# Patient Record
Sex: Male | Born: 1980 | Race: White | Hispanic: No | Marital: Married | Smoking: Former smoker
Health system: Southern US, Community
[De-identification: ages and names within clinical notes are randomized; demographics above are authoritative.]

## PROBLEM LIST (undated history)

## (undated) DIAGNOSIS — I208 Other forms of angina pectoris: Secondary | ICD-10-CM

## (undated) DIAGNOSIS — I517 Cardiomegaly: Secondary | ICD-10-CM

## (undated) DIAGNOSIS — B009 Herpesviral infection, unspecified: Secondary | ICD-10-CM

## (undated) DIAGNOSIS — A419 Sepsis, unspecified organism: Secondary | ICD-10-CM

## (undated) DIAGNOSIS — I2089 Other forms of angina pectoris: Secondary | ICD-10-CM

## (undated) DIAGNOSIS — G4730 Sleep apnea, unspecified: Secondary | ICD-10-CM

## (undated) DIAGNOSIS — F17200 Nicotine dependence, unspecified, uncomplicated: Secondary | ICD-10-CM

## (undated) DIAGNOSIS — F1910 Other psychoactive substance abuse, uncomplicated: Secondary | ICD-10-CM

## (undated) DIAGNOSIS — Z22322 Carrier or suspected carrier of Methicillin resistant Staphylococcus aureus: Secondary | ICD-10-CM

## (undated) HISTORY — DX: Sleep apnea, unspecified: G47.30

## (undated) HISTORY — PX: TOE AMPUTATION: SHX809

## (undated) HISTORY — DX: Nicotine dependence, unspecified, uncomplicated: F17.200

## (undated) HISTORY — DX: Sepsis, unspecified organism: A41.9

## (undated) HISTORY — DX: Cardiomegaly: I51.7

## (undated) HISTORY — DX: Herpesviral infection, unspecified: B00.9

## (undated) HISTORY — DX: Other psychoactive substance abuse, uncomplicated: F19.10

## (undated) HISTORY — DX: Other forms of angina pectoris: I20.8

## (undated) HISTORY — DX: Other forms of angina pectoris: I20.89

## (undated) HISTORY — DX: Carrier or suspected carrier of methicillin resistant Staphylococcus aureus: Z22.322

---

## 2021-05-27 DIAGNOSIS — R0600 Dyspnea, unspecified: Secondary | ICD-10-CM

## 2021-06-09 ENCOUNTER — Encounter: Payer: Self-pay | Admitting: Cardiology

## 2021-06-10 NOTE — Progress Notes (Signed)
Cardiology Office Note:    Date:  06/11/2021   ID:  George Shelton, DOB 06/26/1981, MRN [id number]  PCP:  Adela Glimpse, NP  Cardiologist:  Norman Herrlich, MD   Referring MD: Adela Glimpse, NP  ASSESSMENT:    1. History of endocarditis   2. Chest pain of uncertain etiology   3. Shortness of breath   4. Sinus tachycardia   5. 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  He certainly is at risk for recurrent endocarditis and I could explain much of his symptoms including chest pain with a pleuritic component shortness of breath diaphoresis unintended weight loss.  He has taken recent antibiotic orally we will culture his blood look for markers of infection White count elevated sedimentation rate CRP and repeat his echocardiogram in my office.  Any unresolved issues and transesophageal echocardiogram is appropriate. The EKG certainly fits into the picture with right atrial enlargement will need a good look at his right heart with a history of IV drug abuse associated endocarditis At this time I do not think he requires an ischemia evaluation  Next appointment 4 weeks   Medication Adjustments/Labs and Tests Ordered: Current medicines are reviewed at length with the patient today.  Concerns regarding medicines are outlined above.  Orders Placed This Encounter  Procedures   CT Angio Chest Pulmonary Embolism (PE) W or WO Contrast   EKG 12-Lead   ECHOCARDIOGRAM COMPLETE    No orders of the defined types were placed in this encounter.    Chief Complaint  Patient presents with   Abnormal ECG  She is concerned regarding chest pain shortness of breath rapid heart rate weakness and unintended weight loss  History of Present Illness:    George Shelton is a 40 y.o. male who is being seen  today for the evaluation of sinus tachycardia and abnormal EKG shortness of breath and chest pain at the request of Adela Glimpse, NP.  He tells me has had 4 episodes of MRSA bacteremia and endocarditis last admission to the hospital 9 years ago and has lived a sober life since that time Recently treated for skin infection with Bactrim. He does not feel well he is weak he has episodes of diaphoresis he has shortness of breath with activities easy in the past to do walking back and forth to the truck and lifting and carrying. He has had chest pain mostly sharp left sternal area at times worse with a deep breath he also has had pressure in his chest.  He has a history of asthma but is not wheezing severely he is not using a bronchodilator.  He was placed on antihypertensive therapy with a beta-blocker and ACE inhibitor felt very badly and I stopped it. He has had no fever or chills. He relates his significant weight loss of over 35 pounds in the last 6 months unintentional He feels very weak  He had an echocardiogram at [facility] 05/27/2021 for shortness of breath the left ventricle is normal in size LV systolic function was normal EF 55 to 60% normal diastolic function the right ventricle is normal in size and function and the pulmonary artery systolic pressure was normal both atria are normal in size and there is no significant valvular abnormality  Chest x-ray 05/05/2021 no active disease  EKG  same date showed sinus tachycardia 123 bpm right atrial enlargement nonspecific ST-T abnormality personally reviewed  He was seen in the emergency room at [facility] 05/05/2021 with complaint of chest pain with a history of polysubstance abuse he was afebrile heart rate 119 bpm blood pressure 178 /97 Hemoglobin 15.6 potassium 4.0 creatinine 1.1 is felt not to have acute coronary syndrome and discharged from the emergency room.  An event monitor applied at [facility] internal medicine for 14 days  report shows no episodes of atrial fibrillation or SVT no bradycardia or pauses no episodes of heart block and no findings of ventricular tachycardia supraventricular ectopy burden rare less than 1% ventricular ectopy burden rare less than 1% he had symptomatic events associated with sinus tachycardia  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[facility])    HSV-1 (herpes simplex virus 1) infection    HSV-2 (herpes simplex virus 2) infection    Left atrial enlargement    MRSA (methicillin resistant staph aureus) culture positive    Nicotine dependence    Polysubstance abuse (HCC)    Sepsis (HCC)     Past Surgical History:  Procedure Laterality Date   TOE AMPUTATION     Big Toe    Current Medications: Current Meds  Medication Sig   valACYclovir (VALTREX) 1000 MG tablet Take 1,000 mg by mouth 2 (two) times daily.     Allergies:   Codeine and Vancomycin   Social History   Socioeconomic History   Marital status: Married    Spouse name: Not on file   Number of children: Not on file   Years of education: Not on file   Highest education level: Not on file  Occupational History   Not on file  Tobacco Use   Smoking status: Every Day    Packs/day: 1.50    Types: Cigarettes   Smokeless tobacco: Current    Types: Chew  Substance and Sexual Activity   Alcohol use: Yes    Comment: occasionally   Drug use: Never   Sexual activity: Not on file  Other Topics Concern   Not on file  Social History Narrative   Not on file   Social Determinants of Health   Financial Resource Strain: Not on file  Food Insecurity: Not on file  Transportation Needs: Not on file  Physical Activity: Not on file  Stress: Not on file  Social Connections: Not on file     Family History: The patient's family history includes Atrial fibrillation in his maternal grandmother; Diabetes in his father; Heart attack in his father; Hypertension in his father, maternal grandfather, and maternal  grandmother; Melanoma in his father; Pancreatic cancer in his paternal grandmother; Sleep apnea in his father.  ROS:   ROS Please see the history of present illness.     All other systems reviewed and are negative.  EKGs/Labs/Other Studies Reviewed:    The following studies were reviewed today:   EKG:  EKG is  ordered today.  The ekg ordered today is personally reviewed and demonstrates sinus rhythm right atrial enlargement on EKG    Physical Exam:    VS:  BP (!) 140/94 (BP Location: Left Arm, Patient Position: Sitting, Cuff Size: Normal)   Pulse 100   Ht 6\' 1"  (1.854 m)   Wt 203 lb (92.1 kg)   SpO2 97%   BMI 26.78 kg/m     Wt Readings from Last 3 Encounters:  06/11/21 203 lb (92.1 kg)  05/12/19 206  lb (93.4 kg)     GEN: He does not look acutely ill he is anxious he has no skin lesions or nail lesions of endocarditis.  Well nourished, well developed in no acute distress HEENT: Normal NECK: No JVD; No carotid bruits LYMPHATICS: No lymphadenopathy CARDIAC: No murmur RRR, no murmurs, rubs, gallops RESPIRATORY:  Clear to auscultation without rales, wheezing or rhonchi  ABDOMEN: Soft, non-tender, non-distended MUSCULOSKELETAL:  No edema; No deformity  SKIN: Warm and dry NEUROLOGIC:  Alert and oriented x 3 PSYCHIATRIC:  Normal affect     Signed, Norman Herrlich, MD  06/11/2021 1:48 PM    [facility]

## 2021-06-11 ENCOUNTER — Other Ambulatory Visit: Payer: Self-pay

## 2021-06-11 ENCOUNTER — Ambulatory Visit (INDEPENDENT_AMBULATORY_CARE_PROVIDER_SITE_OTHER): Payer: Self-pay | Admitting: Cardiology

## 2021-06-11 ENCOUNTER — Encounter: Payer: Self-pay | Admitting: Cardiology

## 2021-06-11 VITALS — BP 140/94 | HR 100 | Ht 73.0 in | Wt 203.0 lb

## 2021-06-11 DIAGNOSIS — R000 Tachycardia, unspecified: Secondary | ICD-10-CM

## 2021-06-11 DIAGNOSIS — R9431 Abnormal electrocardiogram [ECG] [EKG]: Secondary | ICD-10-CM

## 2021-06-11 DIAGNOSIS — Z8679 Personal history of other diseases of the circulatory system: Secondary | ICD-10-CM

## 2021-06-11 DIAGNOSIS — R0602 Shortness of breath: Secondary | ICD-10-CM

## 2021-06-11 DIAGNOSIS — R079 Chest pain, unspecified: Secondary | ICD-10-CM

## 2021-06-11 NOTE — Patient Instructions (Addendum)
Medication Instructions:  Your physician recommends that you continue on your current medications as directed. Please refer to the Current Medication list given to you today.  *If you need a refill on your cardiac medications before your next appointment, please call your pharmacy*   Lab Work: Your physician has recommended you make the following change in your medication:  AT [facility].  CVC, SED RATE, CRP, BLOOD CULTURES If you have labs (blood work) drawn today and your tests are completely normal, you will receive your results only by: MyChart Message (if you have MyChart) OR A paper copy in the mail If you have any lab test that is abnormal or we need to change your treatment, we will call you to review the results.   Testing/Procedures: Your physician has requested that you have an echocardiogram. Echocardiography is a painless test that uses sound waves to create images of your heart. It provides your doctor with information about the size and shape of your heart and how well your heart's chambers and valves are working. This procedure takes approximately one hour. There are no restrictions for this procedure.  CT-scan of the chest will be performed at [facility]. Please proceed to the hospital now to get this and your blood work completed.    Follow-Up: At [facility], you and your health needs are our priority.  As part of our continuing mission to provide you with exceptional heart care, we have created designated Provider Care Teams.  These Care Teams include your primary Cardiologist (physician) and Advanced Practice Providers (APPs -  Physician Assistants and Nurse Practitioners) who all work together to provide you with the care you need, when you need it.  We recommend signing up for the patient portal called "MyChart".  Sign up information is provided on this After Visit Summary.  MyChart is used to connect with patients for Virtual Visits (Telemedicine).   Patients are able to view lab/test results, encounter notes, upcoming appointments, etc.  Non-urgent messages can be sent to your provider as well.   To learn more about what you can do with MyChart, go to [email or web address].    Your next appointment:   Already scheduled  The format for your next appointment:   In Person  Provider:   Norman Herrlich, MD    Other Instructions

## 2021-06-19 ENCOUNTER — Telehealth: Payer: Self-pay | Admitting: Cardiology

## 2021-06-19 ENCOUNTER — Other Ambulatory Visit: Payer: Self-pay

## 2021-06-19 DIAGNOSIS — R072 Precordial pain: Secondary | ICD-10-CM

## 2021-06-19 DIAGNOSIS — R0602 Shortness of breath: Secondary | ICD-10-CM

## 2021-06-19 DIAGNOSIS — R079 Chest pain, unspecified: Secondary | ICD-10-CM

## 2021-06-19 NOTE — Telephone Encounter (Signed)
Tried calling patient. No answer and no voicemail set up for me to leave a message. 

## 2021-06-19 NOTE — Telephone Encounter (Signed)
Spoke with patient regarding Dr. Hulen Shouts recommendation.  Patient verbalizes understanding and is agreeable to plan of care. Advised patient to call back with any issues or concerns.    The following instructions were reviewed with the patient and he verbalizes understanding.   [facility] [address] [place], [place] [address] Phone:  [phone]    Please arrive 15 minutes prior to your appointment time for registration and insurance purposes.  The test will take approximately 3 to 4 hours to complete; you may bring reading material.  If someone comes with you to your appointment, they will need to remain in the main lobby due to limited space in the testing area. **If you are pregnant or breastfeeding, please notify the nuclear lab prior to your appointment**  How to prepare for your Myocardial Perfusion Test: Do not eat or drink 3 hours prior to your test, except you may have water. Do not consume products containing caffeine (regular or decaffeinated) 12 hours prior to your test. (ex: coffee, chocolate, sodas, tea). Do bring a list of your current medications with you.  If not listed below, you may take your medications as normal. Do wear comfortable clothes (no dresses or overalls) and walking shoes, tennis shoes preferred (No heels or open toe shoes are allowed). Do NOT wear cologne, perfume, aftershave, or lotions (deodorant is allowed). If these instructions are not followed, your test will have to be rescheduled.  Please report to [address] for your test.  If you have questions or concerns about your appointment, you can call the [facility] at [phone].  If you cannot keep your appointment, please provide 24 hours notification to the Nuclear Lab, to avoid a possible $50 charge to your account.

## 2021-06-19 NOTE — Addendum Note (Signed)
Addended by: Delorse Limber I on: 06/19/2021 04:39 PM   Modules accepted: Orders

## 2021-06-19 NOTE — Addendum Note (Signed)
Addended by: Norman Herrlich on: 06/19/2021 04:46 PM   Modules accepted: Orders

## 2021-06-19 NOTE — Telephone Encounter (Signed)
   Pt said he had an ultra sound of his heart 2 weeks ago, he wanted to know why he needs to get another one again.

## 2021-06-23 DIAGNOSIS — I517 Cardiomegaly: Secondary | ICD-10-CM | POA: Insufficient documentation

## 2021-06-23 DIAGNOSIS — F1910 Other psychoactive substance abuse, uncomplicated: Secondary | ICD-10-CM | POA: Insufficient documentation

## 2021-06-23 DIAGNOSIS — B009 Herpesviral infection, unspecified: Secondary | ICD-10-CM | POA: Insufficient documentation

## 2021-06-23 DIAGNOSIS — Z22322 Carrier or suspected carrier of Methicillin resistant Staphylococcus aureus: Secondary | ICD-10-CM | POA: Insufficient documentation

## 2021-06-23 DIAGNOSIS — F17200 Nicotine dependence, unspecified, uncomplicated: Secondary | ICD-10-CM | POA: Insufficient documentation

## 2021-06-23 DIAGNOSIS — I208 Other forms of angina pectoris: Secondary | ICD-10-CM | POA: Insufficient documentation

## 2021-06-23 DIAGNOSIS — A419 Sepsis, unspecified organism: Secondary | ICD-10-CM | POA: Insufficient documentation

## 2021-06-23 HISTORY — DX: Herpesviral infection, unspecified: B00.9

## 2021-06-24 ENCOUNTER — Telehealth: Payer: Self-pay | Admitting: Cardiology

## 2021-06-24 NOTE — Telephone Encounter (Signed)
Spoke with the patient just now and reminded him that I did call him with the results of his CT and his lab work from [facility]. We went over the results together again and he verbalizes understanding.   He did not have the repeat echo done here in the office as Dr. Dulce Sellar advised that he have the stress test completed instead. The patient is aware that this is scheduled for 07/02/21 and states that he will be here to get it done.

## 2021-06-24 NOTE — Telephone Encounter (Signed)
   Pt calling to get result of his CT echo and blood work. He said it was done at [facility] hospital

## 2021-06-26 NOTE — Addendum Note (Signed)
Addended by: Norman Herrlich on: 06/26/2021 01:53 PM   Modules accepted: Orders

## 2021-06-30 ENCOUNTER — Telehealth (HOSPITAL_COMMUNITY): Payer: Self-pay | Admitting: *Deleted

## 2021-06-30 NOTE — Addendum Note (Signed)
Addended by: Norman Herrlich on: 06/30/2021 02:34 PM   Modules accepted: Orders

## 2021-06-30 NOTE — Telephone Encounter (Signed)
Patient given detailed instructions per Myocardial Perfusion Study Information Sheet for the test on 07/02/21 Patient notified to arrive 15 minutes early and that it is imperative to arrive on time for appointment to keep from having the test rescheduled.  If you need to cancel or reschedule your appointment, please call the office within 24 hours of your appointment. . Patient verbalized understanding. George Shelton

## 2021-07-01 NOTE — Progress Notes (Signed)
Cardiology Office Note:    Date:  07/02/2021   ID:  George Shelton, DOB Aug 17, 1980, MRN [id number]  PCP:  Adela Glimpse, NP  Cardiologist:  Norman Herrlich, MD    Referring MD: Adela Glimpse, NP    ASSESSMENT:    1. Chest pain of uncertain etiology   2. Sinus tachycardia   3. Elevated blood pressure reading    PLAN:    In order of problems listed above:  Fortunately his testing is reassuring no findings of recurrent endocarditis pulmonary embolism.  He is scheduled for myocardial perfusion study I think stress and anxiety plays a major role but also has a chest wall deformity and pectus excavatum that can contribute to his nonanginal chest pain. He brought his iPhone I reviewed greater than 20 strips and he is symptomatic sinus tachycardia heart rates 1 20-1 30 has elevated blood pressure and will place him on a selective beta-blocker that should improve his symptoms.   Next appointment: 3 months with his perfusion study is normal Follow-up with me in the future as needed   Medication Adjustments/Labs and Tests Ordered: Current medicines are reviewed at length with the patient today.  Concerns regarding medicines are outlined above.  No orders of the defined types were placed in this encounter.  Meds ordered this encounter  Medications   nebivolol (BYSTOLIC) 5 MG tablet    Sig: Take 1 tablet (5 mg total) by mouth daily.    Dispense:  90 tablet    Refill:  3    Follow-up for cardiac testing, he is scheduled for myocardial perfusion study 07/08/2021   History of Present Illness:    George Shelton is a 40 y.o. 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  At that visit he was referred for CTA pulmonary embolism protocol that was normal at [facility].  Compliance with diet, lifestyle and medications: Yes  Recent testing: He had an  echocardiogram at [facility] 05/27/2021 for shortness of breath the left ventricle is normal in size LV systolic function was normal EF 55 to 60% normal diastolic function the right ventricle is normal in size and function and the pulmonary artery systolic pressure was normal both atria are normal in size and there is no significant valvular abnormality   Chest x-ray 05/05/2021 no active disease   EKG same date showed sinus tachycardia 123 bpm right atrial enlargement nonspecific ST-T abnormality personally reviewed   He was seen in the emergency room at [facility] 05/05/2021 with complaint of chest pain with a history of polysubstance abuse he was afebrile heart rate 119 bpm blood pressure 178 /97 Hemoglobin 15.6 potassium 4.0 creatinine 1.1 is felt not to have acute coronary syndrome and discharged from the emergency room.   An event monitor applied at [facility] internal medicine for 14 days report shows no episodes of atrial fibrillation or SVT no bradycardia or pauses no episodes of heart block and no findings of ventricular tachycardia supraventricular ectopy burden rare less than 1% ventricular ectopy burden rare less than 1% he had symptomatic events associated with sinus tachycardia  His chest CT scan performed 06/16/2024 showed no findings of pulmonary embolism, cardiovascular structures are normal he had a small pulmonary nodule lungs were otherwise normal and no musculoskeletal abnormality. 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[facility])    HSV-1 (herpes simplex virus 1) infection  HSV-2 (herpes simplex virus 2) infection    Left atrial enlargement    MRSA (methicillin resistant staph aureus) culture positive    Nicotine dependence    Polysubstance abuse (HCC)    Sepsis (HCC)     Past Surgical History:  Procedure Laterality Date   TOE AMPUTATION     Big Toe    Current Medications: Current Meds  Medication Sig   nebivolol (BYSTOLIC) 5 MG tablet Take  1 tablet (5 mg total) by mouth daily.     Allergies:   Codeine and Vancomycin   Social History   Socioeconomic History   Marital status: Married    Spouse name: Not on file   Number of children: Not on file   Years of education: Not on file   Highest education level: Not on file  Occupational History   Not on file  Tobacco Use   Smoking status: Every Day    Packs/day: 1.50    Types: Cigarettes   Smokeless tobacco: Current    Types: Chew  Substance and Sexual Activity   Alcohol use: Yes    Comment: occasionally   Drug use: Never   Sexual activity: Not on file  Other Topics Concern   Not on file  Social History Narrative   Not on file   Social Determinants of Health   Financial Resource Strain: Not on file  Food Insecurity: Not on file  Transportation Needs: Not on file  Physical Activity: Not on file  Stress: Not on file  Social Connections: Not on file     Family History: The patient's family history includes Atrial fibrillation in his maternal grandmother; Diabetes in his father; Heart attack in his father; Hypertension in his father, maternal grandfather, and maternal grandmother; Melanoma in his father; Pancreatic cancer in his paternal grandmother; Sleep apnea in his father. ROS:   Please see the history of present illness.    All other systems reviewed and are negative.  EKGs/Labs/Other Studies Reviewed:    The following studies were reviewed today:   Physical Exam:    VS:  BP (!) 144/78   Pulse (!) 108   Ht 6\' 1"  (1.854 m)   Wt 207 lb 3.2 oz (94 kg)   SpO2 99%   BMI 27.34 kg/m     Wt Readings from Last 3 Encounters:  07/02/21 207 lb 3.2 oz (94 kg)  06/11/21 203 lb (92.1 kg)  05/12/19 206 lb (93.4 kg)     GEN: Anxious well nourished, well developed in no acute distress he has mild thoracic scoliosis and chest wall deformity mild pectus HEENT: Normal NECK: No JVD; No carotid bruits LYMPHATICS: No lymphadenopathy CARDIAC: RRR, no murmurs,  rubs, gallops RESPIRATORY:  Clear to auscultation without rales, wheezing or rhonchi  ABDOMEN: Soft, non-tender, non-distended MUSCULOSKELETAL:  No edema; No deformity  SKIN: Warm and dry NEUROLOGIC:  Alert and oriented x 3 PSYCHIATRIC:  Normal affect    Signed, 07/12/19, MD  07/02/2021 3:30 PM    [facility]

## 2021-07-02 ENCOUNTER — Other Ambulatory Visit: Payer: Self-pay

## 2021-07-02 ENCOUNTER — Encounter: Payer: Self-pay | Admitting: Cardiology

## 2021-07-02 ENCOUNTER — Ambulatory Visit (INDEPENDENT_AMBULATORY_CARE_PROVIDER_SITE_OTHER): Payer: Self-pay | Admitting: Cardiology

## 2021-07-02 VITALS — BP 144/78 | HR 108 | Ht 73.0 in | Wt 207.2 lb

## 2021-07-02 DIAGNOSIS — R079 Chest pain, unspecified: Secondary | ICD-10-CM

## 2021-07-02 DIAGNOSIS — R030 Elevated blood-pressure reading, without diagnosis of hypertension: Secondary | ICD-10-CM

## 2021-07-02 DIAGNOSIS — R000 Tachycardia, unspecified: Secondary | ICD-10-CM

## 2021-07-02 MED ORDER — NEBIVOLOL HCL 5 MG PO TABS: 5.0000 mg | ORAL_TABLET | Freq: Every day | ORAL | 3 refills | Status: DC

## 2021-07-02 NOTE — Patient Instructions (Signed)
Medication Instructions:  Your physician has recommended you make the following change in your medication:  START: Bstolic 5 mg take one tablet by mouth daily.  *If you need a refill on your cardiac medications before your next appointment, please call your pharmacy*   Lab Work: None If you have labs (blood work) drawn today and your tests are completely normal, you will receive your results only by: MyChart Message (if you have MyChart) OR A paper copy in the mail If you have any lab test that is abnormal or we need to change your treatment, we will call you to review the results.   Testing/Procedures: None   Follow-Up: At [facility], you and your health needs are our priority.  As part of our continuing mission to provide you with exceptional heart care, we have created designated Provider Care Teams.  These Care Teams include your primary Cardiologist (physician) and Advanced Practice Providers (APPs -  Physician Assistants and Nurse Practitioners) who all work together to provide you with the care you need, when you need it.  We recommend signing up for the patient portal called "MyChart".  Sign up information is provided on this After Visit Summary.  MyChart is used to connect with patients for Virtual Visits (Telemedicine).  Patients are able to view lab/test results, encounter notes, upcoming appointments, etc.  Non-urgent messages can be sent to your provider as well.   To learn more about what you can do with MyChart, go to [email or web address].    Your next appointment:   3 month(s)  The format for your next appointment:   In Person  Provider:   Norman Herrlich, MD    Other Instructions

## 2021-07-12 DIAGNOSIS — M009 Pyogenic arthritis, unspecified: Secondary | ICD-10-CM

## 2021-07-12 HISTORY — DX: Pyogenic arthritis, unspecified: M00.9

## 2021-07-14 DIAGNOSIS — M009 Pyogenic arthritis, unspecified: Secondary | ICD-10-CM | POA: Insufficient documentation

## 2021-07-14 HISTORY — DX: Pyogenic arthritis, unspecified: M00.9

## 2021-07-17 ENCOUNTER — Telehealth (HOSPITAL_COMMUNITY): Payer: Self-pay | Admitting: *Deleted

## 2021-07-17 NOTE — Telephone Encounter (Signed)
Patient given detailed instructions per Myocardial Perfusion Study Information Sheet for the test on 07/23/2021 at 8:00. Patient notified to arrive 15 minutes early and that it is imperative to arrive on time for appointment to keep from having the test rescheduled.  If you need to cancel or reschedule your appointment, please call the office within 24 hours of your appointment. . Patient verbalized understanding.George Shelton

## 2021-07-23 ENCOUNTER — Ambulatory Visit (INDEPENDENT_AMBULATORY_CARE_PROVIDER_SITE_OTHER): Payer: Self-pay

## 2021-07-23 ENCOUNTER — Other Ambulatory Visit: Payer: Self-pay

## 2021-07-23 DIAGNOSIS — R079 Chest pain, unspecified: Secondary | ICD-10-CM

## 2021-07-23 DIAGNOSIS — R0602 Shortness of breath: Secondary | ICD-10-CM

## 2021-07-23 DIAGNOSIS — R072 Precordial pain: Secondary | ICD-10-CM

## 2021-07-23 MED ORDER — TECHNETIUM TC 99M TETROFOSMIN IV KIT
10.9000 | PACK | Freq: Once | INTRAVENOUS | Status: AC | PRN
  Administered 2021-07-23: 10.9 via INTRAVENOUS

## 2021-07-23 MED ORDER — TECHNETIUM TC 99M TETROFOSMIN IV KIT
31.1000 | PACK | Freq: Once | INTRAVENOUS | Status: AC | PRN
  Administered 2021-07-23: 31.1 via INTRAVENOUS

## 2021-07-23 MED ORDER — REGADENOSON 0.4 MG/5ML IV SOLN
0.4000 mg | Freq: Once | INTRAVENOUS | Status: AC
  Administered 2021-07-23: 0.4 mg via INTRAVENOUS

## 2021-07-24 ENCOUNTER — Telehealth: Payer: Self-pay

## 2021-07-24 LAB — MYOCARDIAL PERFUSION IMAGING
LV dias vol: 141 mL (ref 62–150)
LV sys vol: 58 mL
Nuc Stress EF: 59 %
Peak HR: 110 {beats}/min
Rest HR: 95 {beats}/min
Rest Nuclear Isotope Dose: 10.9 mCi
SDS: 1
SRS: 5
SSS: 6
ST Depression (mm): 0 mm
Stress Nuclear Isotope Dose: 31.1 mCi
TID: 0.97

## 2021-07-24 NOTE — Telephone Encounter (Signed)
-----   Message from Baldo Daub, MD sent at 07/24/2021  4:01 PM EST ----- Great result normal this is reassuring.

## 2021-07-24 NOTE — Telephone Encounter (Signed)
Spoke with patient regarding results and recommendation.  Patient verbalizes understanding and is agreeable to plan of care. Advised patient to call back with any issues or concerns.  

## 2021-08-05 DIAGNOSIS — A4902 Methicillin resistant Staphylococcus aureus infection, unspecified site: Secondary | ICD-10-CM | POA: Diagnosis not present

## 2021-08-05 DIAGNOSIS — G629 Polyneuropathy, unspecified: Secondary | ICD-10-CM | POA: Diagnosis not present

## 2021-08-05 DIAGNOSIS — L02419 Cutaneous abscess of limb, unspecified: Secondary | ICD-10-CM | POA: Diagnosis not present

## 2021-08-06 DIAGNOSIS — J209 Acute bronchitis, unspecified: Secondary | ICD-10-CM | POA: Diagnosis not present

## 2021-08-06 DIAGNOSIS — Z20822 Contact with and (suspected) exposure to covid-19: Secondary | ICD-10-CM | POA: Diagnosis not present

## 2021-09-29 DIAGNOSIS — I10 Essential (primary) hypertension: Secondary | ICD-10-CM | POA: Diagnosis not present

## 2021-09-29 DIAGNOSIS — S61401D Unspecified open wound of right hand, subsequent encounter: Secondary | ICD-10-CM | POA: Diagnosis not present

## 2021-09-29 DIAGNOSIS — L539 Erythematous condition, unspecified: Secondary | ICD-10-CM | POA: Diagnosis not present

## 2021-09-29 DIAGNOSIS — R600 Localized edema: Secondary | ICD-10-CM | POA: Diagnosis not present

## 2021-10-06 ENCOUNTER — Encounter: Payer: Self-pay | Admitting: Cardiology

## 2021-10-06 ENCOUNTER — Ambulatory Visit (INDEPENDENT_AMBULATORY_CARE_PROVIDER_SITE_OTHER): Payer: BC Managed Care – PPO | Admitting: Cardiology

## 2021-10-06 ENCOUNTER — Other Ambulatory Visit: Payer: Self-pay

## 2021-10-06 VITALS — BP 138/90 | HR 103 | Ht 73.0 in | Wt 199.0 lb

## 2021-10-06 DIAGNOSIS — R030 Elevated blood-pressure reading, without diagnosis of hypertension: Secondary | ICD-10-CM | POA: Diagnosis not present

## 2021-10-06 DIAGNOSIS — R000 Tachycardia, unspecified: Secondary | ICD-10-CM | POA: Diagnosis not present

## 2021-10-06 DIAGNOSIS — R079 Chest pain, unspecified: Secondary | ICD-10-CM | POA: Diagnosis not present

## 2021-10-06 NOTE — Patient Instructions (Signed)
Medication Instructions:  ?Your physician recommends that you continue on your current medications as directed. Please refer to the Current Medication list given to you today. ? ?*If you need a refill on your cardiac medications before your next appointment, please call your pharmacy* ? ? ?Lab Work: ?NONE ?If you have labs (blood work) drawn today and your tests are completely normal, you will receive your results only by: ?MyChart Message (if you have MyChart) OR ?A paper copy in the mail ?If you have any lab test that is abnormal or we need to change your treatment, we will call you to review the results. ? ? ?Testing/Procedures: ?NONE ? ? ?Follow-Up: ?At [facility], you and your health needs are our priority.  As part of our continuing mission to provide you with exceptional heart care, we have created designated Provider Care Teams.  These Care Teams include your primary Cardiologist (physician) and Advanced Practice Providers (APPs -  Physician Assistants and Nurse Practitioners) who all work together to provide you with the care you need, when you need it. ? ?We recommend signing up for the patient portal called "MyChart".  Sign up information is provided on this After Visit Summary.  MyChart is used to connect with patients for Virtual Visits (Telemedicine).  Patients are able to view lab/test results, encounter notes, upcoming appointments, etc.  Non-urgent messages can be sent to your provider as well.   ?To learn more about what you can do with MyChart, go to [email or web address].   ? ?Your next appointment:  As needed for your cardiac needs ?  ? ?The format for your next appointment:   ?In Person ? ?Provider:   ?Brian Munley, MD  ? ? ?Other Instructions ?  ?

## 2021-10-06 NOTE — Progress Notes (Signed)
?Cardiology Office Note:   ? ?Date:  10/06/2021  ? ?ID:  George Shelton, DOB 04-07-1981, MRN [id number] ? ?PCP:  Adela Glimpse, NP  ?Cardiologist:  Norman Herrlich, MD   ? ?Referring MD: Adela Glimpse, NP  ? ? ?ASSESSMENT:   ? ?1. Chest pain of uncertain etiology   ?2. Sinus tachycardia   ?3. Elevated blood pressure reading   ? ?PLAN:   ? ?In order of problems listed above: ? ?His cardiac testing echocardiogram myocardial perfusion study monitor overall reassuring and normal I think his sinus tachycardia is related to underlying infection I continue his beta-blocker and it appears as if he has recurrent severe infection of the right hand right wrist and require further evaluation and treatment with and without drainage and antibiotics. ? ? ?Next appointment: As needed ? ? ?Medication Adjustments/Labs and Tests Ordered: ?Current medicines are reviewed at length with the patient today.  Concerns regarding medicines are outlined above.  ?No orders of the defined types were placed in this encounter. ? ?No orders of the defined types were placed in this encounter. ? ? ?Chief Complaint  ?Patient presents with  ? Follow-up  ?  He had a recent myocardial perfusion study which was normal  ? ? ?History of Present Illness:   ? ?George Shelton is a 41 y.o. male with a hx of previous endocarditis with recurrent MRSA bacteremia last seen 07/02/2021 after an extensive evaluation for shortness of breath and chest pain. ?Compliance with diet, lifestyle and medications: yes ? ?He had an echocardiogram at [facility] 05/27/2021 for shortness of breath the left ventricle is normal in size LV systolic function was normal EF 55 to 60% normal diastolic function the right ventricle is normal in size and function and the pulmonary artery systolic pressure was normal both atria are normal in size and there is no significant valvular abnormality ?  ?An event monitor applied at [facility] internal medicine for 14 days report shows no  episodes of atrial fibrillation or SVT no bradycardia or pauses no episodes of heart block and no findings of ventricular tachycardia supraventricular ectopy burden rare less than 1% ventricular ectopy burden rare less than 1% he had symptomatic events associated with sinus tachycardia ?  ?His chest CT scan  showed no findings of pulmonary embolism, cardiovascular structures are normal he had a small pulmonary nodule lungs were otherwise normal and no musculoskeletal abnormality. ?Chest x-ray 05/05/2021 no active disease ? ?Most recently myocardial perfusion study performed 07/23/2021 showed normal left ventricular function EF 59% and normal myocardial perfusion. ? ?He had a recent admission at [facility] health 07/12/2021 admitted for 3 days with infection of the right wrist and wrist joints treated with intravenous and oral antibiotic with Zyvox.  He had orthopedic debridement of the right hand 07/13/2021 he was seen by infectious diseases.  Hepatitis C serology was abnormal.  Other labs included normal renal function.  CPK initially was elevated subsequently normalized CBC showed an elevated white count 16,400. ? ?Prior to entering the room he received a call phone call that he has bone infection on the x-ray I told him I think he should go to his PCP office get a copy of the x-ray report asked what to do about it.  He has increasing signs of infection in the right hand right wrist. ? ?No further chest pain shortness of breath and rapid heart rate was improved with his beta-blocker ?Past Medical History:  ?Diagnosis Date  ? Angina at rest [facility])   ?  HSV-1 (herpes simplex virus 1) infection   ? HSV-2 (herpes simplex virus 2) infection   ? Left atrial enlargement   ? MRSA (methicillin resistant staph aureus) culture positive   ? Nicotine dependence   ? Polysubstance abuse (HCC)   ? Sepsis (HCC)   ? ? ?Past Surgical History:  ?Procedure Laterality Date  ? TOE AMPUTATION    ? Big Toe  ? ? ?Current Medications: ?Current  Meds  ?Medication Sig  ? albuterol (VENTOLIN HFA) 108 (90 Base) MCG/ACT inhaler as needed.  ? clindamycin (CLEOCIN) 300 MG capsule Take 300 mg by mouth 3 (three) times daily.  ? gabapentin (NEURONTIN) 300 MG capsule Take by mouth as needed.  ? ibuprofen (ADVIL) 200 MG tablet Take by mouth daily.  ? nebivolol (BYSTOLIC) 5 MG tablet Take 1 tablet (5 mg total) by mouth daily.  ? sulfamethoxazole-trimethoprim (BACTRIM DS) 800-160 MG tablet Take 2 tablets by mouth 2 (two) times daily.  ? valACYclovir (VALTREX) 1000 MG tablet Take 1,000 mg by mouth 2 (two) times daily.  ?  ? ?Allergies:   Codeine and Vancomycin  ? ?Social History  ? ?Socioeconomic History  ? Marital status: Married  ?  Spouse name: Not on file  ? Number of children: Not on file  ? Years of education: Not on file  ? Highest education level: Not on file  ?Occupational History  ? Not on file  ?Tobacco Use  ? Smoking status: Every Day  ?  Packs/day: 1.50  ?  Types: Cigarettes  ? Smokeless tobacco: Current  ?  Types: Chew  ?Substance and Sexual Activity  ? Alcohol use: Yes  ?  Comment: occasionally  ? Drug use: Never  ? Sexual activity: Not on file  ?Other Topics Concern  ? Not on file  ?Social History Narrative  ? Not on file  ? ?Social Determinants of Health  ? ?Financial Resource Strain: Not on file  ?Food Insecurity: Not on file  ?Transportation Needs: Not on file  ?Physical Activity: Not on file  ?Stress: Not on file  ?Social Connections: Not on file  ?  ? ?Family History: ?The patient's family history includes Atrial fibrillation in his maternal grandmother; Diabetes in his father; Heart attack in his father; Hypertension in his father, maternal grandfather, and maternal grandmother; Melanoma in his father; Pancreatic cancer in his paternal grandmother; Sleep apnea in his father. ?ROS:   ?Please see the history of present illness.    ?All other systems reviewed and are negative. ? ?EKGs/Labs/Other Studies Reviewed:   ? ?The following studies were  reviewed today: ? ? ? ?Physical Exam:   ? ?VS:  BP 138/90   Pulse (!) 103   Ht 6\' 1"  (1.854 m)   Wt 199 lb (90.3 kg)   SpO2 97%   BMI 26.25 kg/m?    ? ?Wt Readings from Last 3 Encounters:  ?10/06/21 199 lb (90.3 kg)  ?07/23/21 203 lb (92.1 kg)  ?07/02/21 207 lb 3.2 oz (94 kg)  ?  ? ?GEN:  Well nourished, well developed in no acute distress right hand is warm swollen and the wound is open. ?HEENT: Normal ?NECK: No JVD; No carotid bruits ?LYMPHATICS: No lymphadenopathy ?CARDIAC: RRR, no murmurs, rubs, gallops ?RESPIRATORY:  Clear to auscultation without rales, wheezing or rhonchi  ?ABDOMEN: Soft, non-tender, non-distended ?MUSCULOSKELETAL:  No edema; No deformity  ?SKIN: Warm and dry ?NEUROLOGIC:  Alert and oriented x 3 ?PSYCHIATRIC:  Normal affect  ? ? ?Signed, ?14/01/22, MD  ?10/06/2021  4:34 PM    ?[facility]  ?

## 2021-10-07 DIAGNOSIS — L02419 Cutaneous abscess of limb, unspecified: Secondary | ICD-10-CM | POA: Diagnosis not present

## 2021-10-07 DIAGNOSIS — M659 Synovitis and tenosynovitis, unspecified: Secondary | ICD-10-CM | POA: Diagnosis not present

## 2021-10-07 DIAGNOSIS — A4902 Methicillin resistant Staphylococcus aureus infection, unspecified site: Secondary | ICD-10-CM | POA: Diagnosis not present

## 2021-10-07 DIAGNOSIS — L03113 Cellulitis of right upper limb: Secondary | ICD-10-CM | POA: Diagnosis not present

## 2021-10-07 DIAGNOSIS — Z5181 Encounter for therapeutic drug level monitoring: Secondary | ICD-10-CM | POA: Diagnosis not present

## 2021-12-01 ENCOUNTER — Encounter: Payer: Self-pay | Admitting: Sports Medicine

## 2021-12-01 ENCOUNTER — Ambulatory Visit (INDEPENDENT_AMBULATORY_CARE_PROVIDER_SITE_OTHER): Payer: BC Managed Care – PPO

## 2021-12-01 ENCOUNTER — Ambulatory Visit: Payer: BC Managed Care – PPO | Admitting: Sports Medicine

## 2021-12-01 VITALS — Temp 96.2°F

## 2021-12-01 DIAGNOSIS — L03031 Cellulitis of right toe: Secondary | ICD-10-CM

## 2021-12-01 DIAGNOSIS — Z8614 Personal history of Methicillin resistant Staphylococcus aureus infection: Secondary | ICD-10-CM

## 2021-12-01 DIAGNOSIS — S90821A Blister (nonthermal), right foot, initial encounter: Secondary | ICD-10-CM | POA: Diagnosis not present

## 2021-12-01 DIAGNOSIS — L97519 Non-pressure chronic ulcer of other part of right foot with unspecified severity: Secondary | ICD-10-CM

## 2021-12-01 DIAGNOSIS — G629 Polyneuropathy, unspecified: Secondary | ICD-10-CM

## 2021-12-01 DIAGNOSIS — L02611 Cutaneous abscess of right foot: Secondary | ICD-10-CM

## 2021-12-01 DIAGNOSIS — F1910 Other psychoactive substance abuse, uncomplicated: Secondary | ICD-10-CM

## 2021-12-01 MED ORDER — GABAPENTIN 600 MG PO TABS: 600.0000 mg | ORAL_TABLET | Freq: Three times a day (TID) | ORAL | 2 refills | Status: DC

## 2021-12-01 MED ORDER — SULFAMETHOXAZOLE-TRIMETHOPRIM 400-80 MG PO TABS: 1.0000 | ORAL_TABLET | Freq: Two times a day (BID) | ORAL | 0 refills | Status: DC

## 2021-12-01 NOTE — Progress Notes (Signed)
Subjective: ?George Shelton is a 41 y.o. male patient seen in office for evaluation of ulceration of the right second toe states that it always rubs and stays very callused and states that he also had a blister over his previous hallux amputation stump and states that he has had issues with this foot for over 3 years is started out with his right great toe losing that back in 2018 due to osteomyelitis and they state that he was also bitten a few years ago by a dog on the foot and has always had problems with infection has a history of MRSA and states that now the right second toe has been red and swollen for the last week.  Patient admits to a history of substance abuse and states that he is in recovery and does not use pain medication only takes ibuprofen and states that he needs a refill on his Neurontin. ? ?Patient Active Problem List  ? Diagnosis Date Noted  ? Wrist joint infection (HCC) 07/14/2021  ? Infection of right wrist (HCC) 07/12/2021  ? Angina at rest [facility]) 06/23/2021  ? HSV-1 (herpes simplex virus 1) infection 06/23/2021  ? Left atrial enlargement 06/23/2021  ? MRSA (methicillin resistant staph aureus) culture positive 06/23/2021  ? Nicotine dependence 06/23/2021  ? Polysubstance abuse (HCC) 06/23/2021  ? Sepsis (HCC) 06/23/2021  ? ?Current Outpatient Medications on File Prior to Visit  ?Medication Sig Dispense Refill  ? ibuprofen (ADVIL) 200 MG tablet Take by mouth daily.    ? nebivolol (BYSTOLIC) 5 MG tablet Take 1 tablet (5 mg total) by mouth daily. 90 tablet 3  ? ?No current facility-administered medications on file prior to visit.  ? ?Allergies  ?Allergen Reactions  ? Codeine   ? Vancomycin   ? ? ?No results found for this or any previous visit (from the past 2160 hour(s)). ? ?Objective: ?There were no vitals filed for this visit. ? ?General: Patient is awake, alert, oriented x 3 and in no acute distress. ? ?Dermatology: Skin is warm and dry bilateral with a pinpoint ulceration present  ?Distal  tuft of the right second toe. Ulceration measures 0.1 cm x 0.1 cm x 0.1 cm. There is a minimal keratotic border with a granular base. The ulceration does not probe to bone. There is no malodor, no active drainage, localized erythema, localized edema.  ? ?To the hallux amputation stump there is a ruptured blister that has not deroofed with no serous or active drainage, no significant redness or warmth or swelling to this affected area. ? ?Vascular: Dorsalis Pedis pulse = 1/4 Bilateral,  Posterior Tibial pulse = 1/4 Bilateral,  Capillary Fill Time < 5 seconds x9 ? ?Neurologic: Protective sensation severely diminished bilateral worse on the right. ? ?Musculosketal: Status post right hallux amputation 2010 ? ?Xrays, right foot: To the right second toe no bony destruction suggestive of osteomyelitis. No gas in soft tissues.  The second toe is noted to be rather long compared to the other toes.  History of previous hallux and distal metatarsal amputation. ? ?No results for input(s): GRAMSTAIN, LABORGA in the last 8760 hours. ? ?Assessment and Plan:  ?Problem List Items Addressed This Visit   ? ?  ? Other  ? Polysubstance abuse (HCC)  ? ?Other Visit Diagnoses   ? ? Ulcer of toe of right foot, unspecified ulcer stage (HCC)    -  Primary  ? Relevant Orders  ? DG Foot Complete Right  ? Cellulitis and abscess of toe of right  foot      ? Blister of right foot, initial encounter      ? Neuropathy      ? History of MRSA infection      ? Relevant Medications  ? sulfamethoxazole-trimethoprim (BACTRIM) 400-80 MG tablet  ? ?  ? ? ? ?-Examined patient and discussed the progression of the wound and treatment alternatives. ?-Xrays reviewed ?-Cleanse ulceration and apply Betadine and advised patient to do the same once daily may leave open to air or cover with a loose white clean sock ?-Prescribed Bactrim for patient to take for preventative measures for cellulitis of toe and history of MRSA ?-Prescribed Neurontin 600 mg 3 times a day as  needed for pain unrelieved by ibuprofen.  This medication regimen is recommended since patient is in recovery ?-Discussed with patient because he is very active with working in the mechanical pressure that is being put onto the second toe it will likely continue to rub and reulceration always be a problem like it has been for years may benefit in the future from a transmetatarsal amputation with a custom toe filler in the near future ?- Advised patient to go to the ER or return to office if the wound worsens or if constitutional symptoms are present. ?-Patient to return to office in 2 weeks for follow up care and possible future planning of a right TMA or sooner if problems arise. ? ?Asencion Islam, DPM ?

## 2021-12-14 ENCOUNTER — Ambulatory Visit (INDEPENDENT_AMBULATORY_CARE_PROVIDER_SITE_OTHER): Payer: BC Managed Care – PPO

## 2021-12-14 ENCOUNTER — Encounter: Payer: Self-pay | Admitting: Podiatry

## 2021-12-14 ENCOUNTER — Ambulatory Visit: Payer: BC Managed Care – PPO | Admitting: Podiatry

## 2021-12-14 DIAGNOSIS — L97519 Non-pressure chronic ulcer of other part of right foot with unspecified severity: Secondary | ICD-10-CM

## 2021-12-14 DIAGNOSIS — M869 Osteomyelitis, unspecified: Secondary | ICD-10-CM | POA: Diagnosis not present

## 2021-12-14 MED ORDER — DOXYCYCLINE HYCLATE 100 MG PO TABS: 100.0000 mg | ORAL_TABLET | Freq: Two times a day (BID) | ORAL | 0 refills | Status: AC

## 2021-12-14 NOTE — Progress Notes (Signed)
?  Subjective:  ?Patient ID: George Shelton, male    DOB: [birth year],   MRN: [id number] ? ?No chief complaint on file. ? ? ?41 y.o. male presents for concern of right second toe ulceration and infection. Relates he has been taking the antibiotics (bactrim)  given by Dr. Marylene Land but feels like they are not helping. Relates the toe has been worsening. Does have a history of right great toe osteomyelitis and MRSA from a dog bit in 2018. Relates some nausea today and significant pain . Patient is concerned about work and how he will recover from surgery and is agitated by situation. Denies any other pedal complaints. Denies n/v/f/c.  ? ?Past Medical History:  ?Diagnosis Date  ? Angina at rest [facility])   ? HSV-1 (herpes simplex virus 1) infection   ? HSV-2 (herpes simplex virus 2) infection   ? Left atrial enlargement   ? MRSA (methicillin resistant staph aureus) culture positive   ? Nicotine dependence   ? Polysubstance abuse (HCC)   ? Sepsis (HCC)   ? ? ?Objective:  ?Physical Exam: ?Vascular: DP/PT pulses 2/4 bilateral. CFT <3 seconds. Normal hair growth on digits. No edema.  ?Skin. No lacerations or abrasions bilateral feet. Distal second toe ulceration appears nearly healed no active purulence drainage, however there is edema and erythema noted to the right toe. Relates it has not improved.  ?Musculoskeletal: MMT 5/5 bilateral lower extremities in DF, PF, Inversion and Eversion. Deceased ROM in DF of ankle joint.  ?Neurological: Sensation intact to light touch.  ? ?Assessment:  ? ?1. Ulcer of toe of right foot, unspecified ulcer stage (HCC)   ? ? ? ?Plan:  ?Patient was evaluated and treated and all questions answered. ?Ulcer right second digit osteomyelitis  ?X-rays reviewed. Their is osseous destruction noted to distal aspect of the distal phalanx of the second digit.  ?-Debridement as below ?-Dressed with betadine, DSD. ?-Off-loading with sandal. Refused surgical shoe today.  ?-Doxycyline sent to pharmacy x 14 days.    ?- ID referral placed.  ?-Discussed glucose control and proper protein-rich diet.  ?-Discussed if any worsening redness, pain, fever or chills to call or may need to report to the emergency room. Patient expressed understanding.  ?-Discussed with patient he will at very least likely need amputation of the second digit. Patient relates initially that he is ok and ready to just remove all the toes as he has been dealing with this foot for years. However through course of visit expressed how he cannot be out of work and then refusing any surgery. Discussed with patient his best option is to report to emergency room now for IV abx and surgical removal of toe. Patient wanting to avoid this option. Related we could try oral antibiotics and schedule for surgery next week to help with work situation. Patient at that time then refusing because wants to avoid loosing the toe and being out of work and would like to try IV antibotics out patient. Discussed referral to ID and wound care.  ?-Discussed again if he has any worsening symptoms or any systemic signs of infection to report to emergency room immediately. He did express understanding.  ?Patient to follow-up in one week for re-evaluation.  ? ? ?No follow-ups on file. ? ? ?Louann Sjogren, DPM  ? ? ?

## 2021-12-15 ENCOUNTER — Encounter: Payer: Self-pay | Admitting: Internal Medicine

## 2021-12-15 ENCOUNTER — Telehealth: Payer: Self-pay

## 2021-12-15 ENCOUNTER — Other Ambulatory Visit: Payer: Self-pay

## 2021-12-15 ENCOUNTER — Ambulatory Visit (INDEPENDENT_AMBULATORY_CARE_PROVIDER_SITE_OTHER): Payer: BC Managed Care – PPO | Admitting: Internal Medicine

## 2021-12-15 DIAGNOSIS — R768 Other specified abnormal immunological findings in serum: Secondary | ICD-10-CM

## 2021-12-15 DIAGNOSIS — M869 Osteomyelitis, unspecified: Secondary | ICD-10-CM | POA: Insufficient documentation

## 2021-12-15 HISTORY — DX: Other specified abnormal immunological findings in serum: R76.8

## 2021-12-15 NOTE — Progress Notes (Addendum)
?  [facility]  ? ? ? ? ?Reason for Consult:osteomyelitis of right second toe   ?Referring Physician: Dr. Blenda Mounts ? ? ? Patient ID: George Shelton, male    DOB: Aug 07, 1980, 41 y.o.   MRN: [id number] ? ?HPI:   ?He is here for evaluation of his right second toe and concern for osteomyelitis.  ?He has a remote history of IVDU but has remained drug free for years and complications of infective endocarditis with MRSA in 2013 and a recent right hand/wrist infection.  He was seen earlier this month by podiatry due to concern for an infected right second toe.  He has a history of amputation of his right great toe in the past due to infection in 2018 and has had continued issues with the foot since that time.  More recently over the last several weeks his toe has become swollen with some pus-like drainage coming out.   ?In his discussion with podiatry, they recommended transmetatarsal amputation which he refused.  His main concern is being able to take time off from work.   ? ?Past Medical History:  ?Diagnosis Date  ? Angina at rest [facility])   ? HSV-1 (herpes simplex virus 1) infection   ? HSV-2 (herpes simplex virus 2) infection   ? Left atrial enlargement   ? MRSA (methicillin resistant staph aureus) culture positive   ? Nicotine dependence   ? Polysubstance abuse ([place])   ? Sepsis ([place])   ? ? ?Prior to Admission medications   ?Medication Sig Start Date End Date Taking? Authorizing Provider  ?doxycycline (VIBRA-TABS) 100 MG tablet Take 1 tablet (100 mg total) by mouth 2 (two) times daily for 14 days. 12/14/21 12/28/21 Yes Lorenda Peck, DPM  ?gabapentin (NEURONTIN) 600 MG tablet Take 1 tablet (600 mg total) by mouth 3 (three) times daily. 12/01/21  Yes Stover, Lady Saucier, DPM  ?ibuprofen (ADVIL) 200 MG tablet Take by mouth daily.   Yes [provider]  ?nebivolol (BYSTOLIC) 5 MG tablet Take 1 tablet (5 mg total) by mouth daily. 07/02/21  Yes Richardo Priest, MD  ? ? ?Allergies  ?Allergen  Reactions  ? Codeine   ? Vancomycin   ? ? ?Social History  ? ?Tobacco Use  ? Smoking status: Every Day  ?  Packs/day: 1.50  ?  Types: Cigarettes  ? Smokeless tobacco: Current  ?  Types: Chew  ?Substance Use Topics  ? Alcohol use: Yes  ?  Comment: occasionally  ? Drug use: Never  ? ? ?Family History  ?Problem Relation Age of Onset  ? Heart attack Father   ? Hypertension Father   ? Diabetes Father   ? Melanoma Father   ? Sleep apnea Father   ? Hypertension Maternal Grandmother   ? Atrial fibrillation Maternal Grandmother   ? Hypertension Maternal Grandfather   ? Pancreatic cancer Paternal Grandmother   ? ? ?Review of Systems ? Constitutional: negative for fevers and chills ?Gastrointestinal: negative for nausea and diarrhea ?All other systems reviewed and are negative   ? ?Constitutional: in no apparent distress  ?Vitals:  ? 12/15/21 0920  ?BP: (!) 151/95  ?Pulse: 100  ?Temp: 98.1 ?F (36.7 ?C)  ? ?EYES: anicteric ?Respiratory: normal respiratory effort ?Musculoskeletal: right second toe with significant edema, redness, no drainage noted ?Skin: no rash ? ?Labs: ?No results found for: WBC, HGB, HCT, MCV, PLT No results found for: CREATININE, BUN, NA, K, CL, CO2 No results found for: ALT, AST, GGT, ALKPHOS, BILITOT, INR  ? ?  Assessment: right second toe osteomyelitis.  I reviewed the xrays with him, comparing the two and noted worsening destruction between them and progressive nature of the osteomyelitis.  I discussed with him the options and that I am in agreement with podiatry that amputation remains his best option for treatment.  He did voice his understanding and that IV antibiotics alone not likely to cure this.  He is hopeful but knows that if it persists, surgery will be required.  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   ?I also discussed the absolute need to quit smoking  and dipping to give his toe the best chance.   ?He also has a history of hepatitis C Ab positive and will recheck his viral load today.  ? ?Plan: ?1)  PICC line ?2) IV datpomycin and ceftriaxone ?3) labs today ?Follow up in 2 weeks ? ?Diagnosis: ?Osteomyelitis of second toe right foot ? ?Culture Result: none ? ?Allergies  ?Allergen Reactions  ? Codeine   ? Vancomycin   ? ? ?OPAT Orders ?Discharge antibiotics to be given via PICC line ?Discharge antibiotics: daptomycin 8 mg/kg IV once daily + ceftriaxone 2 grams IV once daily ?Per pharmacy protocol yes ?Duration: ?6 weeks ?End Date: ?January 27 2022 ? ?[facility] Care Per Protocol: yes ? ?Home health RN for IV administration and teaching; PICC line care and labs.   ? ?Labs weekly while on IV antibiotics: ?_x_ CBC with differential ?__ BMP ?_x_ CMP ?_x_ CRP ?_x_ ESR ?__ Vancomycin trough ?__x CK ? ?__x Please pull PIC at completion of IV antibiotics ?__ Please leave PIC in place until doctor has seen patient or been notified ? ?Fax weekly labs to [phone] ? ?Clinic Follow Up Appt: ?12/30/21 ? ?

## 2021-12-15 NOTE — Telephone Encounter (Addendum)
Per MD sent a community message to [facility] for OPAT orders placed for this patient. Pt is schedule with IR for picc placement on 5/18 @ 11:30. Pt will have to go to short stay for first dose. Pt gave ok for me to relay appointment info to wife Ria Comment [phone]) who verbalized her understanding and had no further questions. ?Left vm for short stay and faxed over orders. ?

## 2021-12-16 ENCOUNTER — Telehealth: Payer: Self-pay

## 2021-12-16 ENCOUNTER — Other Ambulatory Visit: Payer: Self-pay

## 2021-12-16 DIAGNOSIS — R768 Other specified abnormal immunological findings in serum: Secondary | ICD-10-CM

## 2021-12-16 DIAGNOSIS — M869 Osteomyelitis, unspecified: Secondary | ICD-10-CM

## 2021-12-16 NOTE — Addendum Note (Signed)
Addended by: Tomi Bamberger on: 12/16/2021 04:19 PM ? ? Modules accepted: Orders ? ?

## 2021-12-16 NOTE — Telephone Encounter (Signed)
Patient called this morning stating that he did not complete labs at his appointment yesterday. ? ?Lab orders were transferred to [facility] and lab requisitions were faxed to the [place] in [place] at [address] per patient preference.  ? ?Electronically confirmed successful receipt of fax. Called patient and made him aware, encouraged patient to call the [facility] facility to make arrangements to be drawn. Provided [employer] phone number [phone]. ? ?Patient stated understanding and that he would call today. No additional questions. ? ?Wyvonne Lenz, RN  ?

## 2021-12-17 ENCOUNTER — Encounter (HOSPITAL_COMMUNITY)
Admission: RE | Admit: 2021-12-17 | Discharge: 2021-12-17 | Disposition: A | Discharge status: Home / Self Care | Payer: BC Managed Care – PPO | Source: Ambulatory Visit | Attending: Internal Medicine | Admitting: Internal Medicine

## 2021-12-17 ENCOUNTER — Ambulatory Visit (HOSPITAL_COMMUNITY)
Admission: RE | Admit: 2021-12-17 | Discharge: 2021-12-17 | Disposition: A | Discharge status: Home / Self Care | Payer: BC Managed Care – PPO | Source: Ambulatory Visit | Attending: Internal Medicine | Admitting: Internal Medicine

## 2021-12-17 ENCOUNTER — Other Ambulatory Visit: Payer: Self-pay

## 2021-12-17 ENCOUNTER — Other Ambulatory Visit: Payer: BC Managed Care – PPO

## 2021-12-17 ENCOUNTER — Ambulatory Visit: Payer: Self-pay | Admitting: Internal Medicine

## 2021-12-17 DIAGNOSIS — Z79899 Other long term (current) drug therapy: Secondary | ICD-10-CM | POA: Diagnosis not present

## 2021-12-17 DIAGNOSIS — R768 Other specified abnormal immunological findings in serum: Secondary | ICD-10-CM

## 2021-12-17 DIAGNOSIS — M869 Osteomyelitis, unspecified: Secondary | ICD-10-CM | POA: Insufficient documentation

## 2021-12-17 DIAGNOSIS — M868X7 Other osteomyelitis, ankle and foot: Secondary | ICD-10-CM | POA: Diagnosis not present

## 2021-12-17 DIAGNOSIS — Z452 Encounter for adjustment and management of vascular access device: Secondary | ICD-10-CM | POA: Diagnosis not present

## 2021-12-17 MED ORDER — HEPARIN SOD (PORK) LOCK FLUSH 100 UNIT/ML IV SOLN
INTRAVENOUS | Status: AC
  Filled 2021-12-17: qty 5

## 2021-12-17 MED ORDER — LIDOCAINE HCL (PF) 1 % IJ SOLN
INTRAMUSCULAR | Status: DC | PRN
  Administered 2021-12-17: 10 mL

## 2021-12-17 MED ORDER — HEPARIN SOD (PORK) LOCK FLUSH 100 UNIT/ML IV SOLN: 250.0000 [IU] | INTRAVENOUS | Status: AC | PRN

## 2021-12-17 MED ORDER — LIDOCAINE HCL 1 % IJ SOLN
INTRAMUSCULAR | Status: AC
  Filled 2021-12-17: qty 20

## 2021-12-17 MED ORDER — CEFTRIAXONE SODIUM 2 G IJ SOLR
2.0000 g | Freq: Once | INTRAMUSCULAR | Status: AC
Start: 2021-12-17 — End: 2021-12-17
  Administered 2021-12-17: 2 g via INTRAVENOUS
  Filled 2021-12-17: qty 20

## 2021-12-17 MED ORDER — HEPARIN SOD (PORK) LOCK FLUSH 100 UNIT/ML IV SOLN
INTRAVENOUS | Status: AC
  Administered 2021-12-17: 250 [IU]
  Filled 2021-12-17: qty 5

## 2021-12-17 MED ORDER — SODIUM CHLORIDE 0.9 % IV SOLN
8.0000 mg/kg | Freq: Once | INTRAVENOUS | Status: AC
Start: 2021-12-17 — End: 2021-12-17
  Administered 2021-12-17: 700 mg via INTRAVENOUS
  Filled 2021-12-17: qty 14

## 2021-12-17 NOTE — Procedures (Signed)
Successful placement of single lumen PICC line to right basilic vein. Length 39cm Tip at lower SVC/RA PICC capped No complications Ready for use.  EBL < 5 mL   Tymber Stallings, AGNP 12/17/2021 1:48 PM

## 2021-12-18 DIAGNOSIS — M869 Osteomyelitis, unspecified: Secondary | ICD-10-CM | POA: Diagnosis not present

## 2021-12-18 LAB — WOUND CULTURE

## 2021-12-19 DIAGNOSIS — M869 Osteomyelitis, unspecified: Secondary | ICD-10-CM | POA: Diagnosis not present

## 2021-12-20 DIAGNOSIS — M869 Osteomyelitis, unspecified: Secondary | ICD-10-CM | POA: Diagnosis not present

## 2021-12-21 ENCOUNTER — Encounter: Payer: Self-pay | Admitting: Podiatry

## 2021-12-21 ENCOUNTER — Ambulatory Visit: Payer: Self-pay | Admitting: Podiatry

## 2021-12-21 ENCOUNTER — Ambulatory Visit: Payer: BC Managed Care – PPO | Admitting: Podiatry

## 2021-12-21 DIAGNOSIS — L97519 Non-pressure chronic ulcer of other part of right foot with unspecified severity: Secondary | ICD-10-CM

## 2021-12-21 DIAGNOSIS — L03031 Cellulitis of right toe: Secondary | ICD-10-CM

## 2021-12-21 DIAGNOSIS — Z91199 Patient's noncompliance with other medical treatment and regimen due to unspecified reason: Secondary | ICD-10-CM

## 2021-12-21 DIAGNOSIS — L02611 Cutaneous abscess of right foot: Secondary | ICD-10-CM

## 2021-12-21 DIAGNOSIS — M869 Osteomyelitis, unspecified: Secondary | ICD-10-CM | POA: Diagnosis not present

## 2021-12-21 LAB — COMPREHENSIVE METABOLIC PANEL
AG Ratio: 1.5 (calc) (ref 1.0–2.5)
ALT: 43 U/L (ref 9–46)
AST: 49 U/L — ABNORMAL HIGH (ref 10–40)
Albumin: 4.3 g/dL (ref 3.6–5.1)
Alkaline phosphatase (APISO): 75 U/L (ref 36–130)
BUN: 17 mg/dL (ref 7–25)
CO2: 28 mmol/L (ref 20–32)
Calcium: 9.5 mg/dL (ref 8.6–10.3)
Chloride: 103 mmol/L (ref 98–110)
Creat: 1.23 mg/dL (ref 0.60–1.29)
Globulin: 2.8 g/dL (calc) (ref 1.9–3.7)
Glucose, Bld: 94 mg/dL (ref 65–99)
Potassium: 4.1 mmol/L (ref 3.5–5.3)
Sodium: 140 mmol/L (ref 135–146)
Total Bilirubin: 0.4 mg/dL (ref 0.2–1.2)
Total Protein: 7.1 g/dL (ref 6.1–8.1)

## 2021-12-21 LAB — CBC WITH DIFFERENTIAL/PLATELET
Absolute Monocytes: 423 cells/uL (ref 200–950)
Basophils Absolute: 31 cells/uL (ref 0–200)
Basophils Relative: 0.6 %
Eosinophils Absolute: 143 cells/uL (ref 15–500)
Eosinophils Relative: 2.8 %
HCT: 45.1 % (ref 38.5–50.0)
Hemoglobin: 15.4 g/dL (ref 13.2–17.1)
Lymphs Abs: 1199 cells/uL (ref 850–3900)
MCH: 30.3 pg (ref 27.0–33.0)
MCHC: 34.1 g/dL (ref 32.0–36.0)
MCV: 88.8 fL (ref 80.0–100.0)
MPV: 8.9 fL (ref 7.5–12.5)
Monocytes Relative: 8.3 %
Neutro Abs: 3305 cells/uL (ref 1500–7800)
Neutrophils Relative %: 64.8 %
Platelets: 295 10*3/uL (ref 140–400)
RBC: 5.08 10*6/uL (ref 4.20–5.80)
RDW: 13.2 % (ref 11.0–15.0)
Total Lymphocyte: 23.5 %
WBC: 5.1 10*3/uL (ref 3.8–10.8)

## 2021-12-21 LAB — SEDIMENTATION RATE: Sed Rate: 9 mm/h (ref 0–15)

## 2021-12-21 LAB — HEPATITIS C RNA QUANTITATIVE
HCV Quantitative Log: <1.18 log IU/mL
HCV RNA, PCR, QN: <15 IU/mL

## 2021-12-21 LAB — C-REACTIVE PROTEIN: CRP: 4.2 mg/L (ref <8.0)

## 2021-12-21 LAB — CK: Total CK: 1287 U/L — ABNORMAL HIGH (ref 44–196)

## 2021-12-21 NOTE — Progress Notes (Signed)
  Subjective:  Patient ID: George Shelton, male    DOB: [birth year],   MRN: [id number]  No chief complaint on file.   41 y.o. male presents for follow-up of right second toe ulceration and infection. He has seen infection disease and started a PICC line with daptomycin and cefepime for 6 weeks.  Relates it is doing better. Denies any other pedal complaints. Denies n/v/f/c.   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[facility])    HSV-1 (herpes simplex virus 1) infection    HSV-2 (herpes simplex virus 2) infection    Left atrial enlargement    MRSA (methicillin resistant staph aureus) culture positive    Nicotine dependence    Polysubstance abuse (HCC)    Sepsis (HCC)     Objective:  Physical Exam: Vascular: DP/PT pulses 2/4 bilateral. CFT <3 seconds. Normal hair growth on digits. No edema.  Skin. No lacerations or abrasions bilateral feet. Distal second toe ulceration appears nearly healed no active purulence drainage, however there is edema and erythema noted to the right toe.  Musculoskeletal: MMT 5/5 bilateral lower extremities in DF, PF, Inversion and Eversion. Deceased ROM in DF of ankle joint.  Neurological: Sensation intact to light touch.   Assessment:   1. Ulcer of toe of right foot, unspecified ulcer stage (HCC)   2. 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 Cellulitis and abscess of toe of right foot       Plan:  Patient was evaluated and treated and all questions answered. Ulcer right second digit osteomyelitis  X-rays reviewed. Their is osseous destruction noted to distal aspect of the distal phalanx of the second digit.  -Debridement as below -Dressed with betadine, DSD. -Off-loading with sandal. Refused surgical shoe today.   -Followed up with ID and has started PICC line  daptomycin 8 mg/kg IV once daily + ceftriaxone 2 grams IV once daily -Discussed glucose control and proper protein-rich diet.  -Discussed if any worsening redness,  pain, fever or chills to call or may need to report to the emergency room. Patient expressed understanding.  -Discussed with patient continued wound care and IV antibiotics. Relayed that he is still at risk for amputation of this second toe. -Discussed again if he has any worsening symptoms or any systemic signs of infection to report to emergency room immediately. He did express understanding.  Patient to follow-up in fourweeks for re-evaluation.    No follow-ups on file.   Louann Sjogren, DPM

## 2021-12-21 NOTE — Progress Notes (Signed)
No show

## 2021-12-22 DIAGNOSIS — M869 Osteomyelitis, unspecified: Secondary | ICD-10-CM | POA: Diagnosis not present

## 2021-12-23 DIAGNOSIS — M869 Osteomyelitis, unspecified: Secondary | ICD-10-CM | POA: Diagnosis not present

## 2021-12-24 DIAGNOSIS — M869 Osteomyelitis, unspecified: Secondary | ICD-10-CM | POA: Diagnosis not present

## 2021-12-25 DIAGNOSIS — M869 Osteomyelitis, unspecified: Secondary | ICD-10-CM | POA: Diagnosis not present

## 2021-12-26 DIAGNOSIS — M869 Osteomyelitis, unspecified: Secondary | ICD-10-CM | POA: Diagnosis not present

## 2021-12-27 DIAGNOSIS — M869 Osteomyelitis, unspecified: Secondary | ICD-10-CM | POA: Diagnosis not present

## 2021-12-28 DIAGNOSIS — M869 Osteomyelitis, unspecified: Secondary | ICD-10-CM | POA: Diagnosis not present

## 2021-12-29 DIAGNOSIS — M869 Osteomyelitis, unspecified: Secondary | ICD-10-CM | POA: Diagnosis not present

## 2021-12-30 ENCOUNTER — Ambulatory Visit: Payer: BC Managed Care – PPO | Admitting: Internal Medicine

## 2021-12-30 ENCOUNTER — Other Ambulatory Visit: Payer: Self-pay

## 2021-12-30 ENCOUNTER — Encounter: Payer: Self-pay | Admitting: Internal Medicine

## 2021-12-30 ENCOUNTER — Telehealth: Payer: Self-pay

## 2021-12-30 VITALS — BP 146/93 | HR 95 | Temp 97.7°F | Wt 189.0 lb

## 2021-12-30 DIAGNOSIS — M869 Osteomyelitis, unspecified: Secondary | ICD-10-CM | POA: Diagnosis not present

## 2021-12-30 DIAGNOSIS — R768 Other specified abnormal immunological findings in serum: Secondary | ICD-10-CM

## 2021-12-30 DIAGNOSIS — Z452 Encounter for adjustment and management of vascular access device: Secondary | ICD-10-CM | POA: Insufficient documentation

## 2021-12-30 DIAGNOSIS — F17210 Nicotine dependence, cigarettes, uncomplicated: Secondary | ICD-10-CM

## 2021-12-30 DIAGNOSIS — Z5181 Encounter for therapeutic drug level monitoring: Secondary | ICD-10-CM | POA: Insufficient documentation

## 2021-12-30 HISTORY — DX: Encounter for therapeutic drug level monitoring: Z51.81

## 2021-12-30 HISTORY — DX: Encounter for adjustment and management of vascular access device: Z45.2

## 2021-12-30 MED ORDER — LINEZOLID 600 MG PO TABS: 600.0000 mg | ORAL_TABLET | Freq: Two times a day (BID) | ORAL | 0 refills | Status: DC

## 2021-12-30 NOTE — Telephone Encounter (Signed)
Per MD called [facility] to relay orders to stop Daptomycin today and to pull picc on 6/15 after last dose of ceftriaxone. Spoke to Pharmacist Amy who read back order and verbalized her understanding.

## 2021-12-30 NOTE — Assessment & Plan Note (Signed)
He still struggles with smoking cessation but is contemplative, actively trying to quit.

## 2021-12-30 NOTE — Assessment & Plan Note (Signed)
Clinically not much change overall.  I discussed with him the options again and I emphasized that amputation is indicated for him and he is now more in agreement with that.  He is going to call his podiatrist to consider amputation after his upcoming trip.  In the meantime I will have him continue with ceftriaxone through 6/15 then stop.  I will also have him continue with oral linezolid for now as well.   He otherwise will not need to come back here and will not need further antibiotics after surgery.    rtc as needed

## 2021-12-30 NOTE — Progress Notes (Signed)
   Subjective:    Patient ID: George Shelton, male    DOB: [birth year], 41 y.o.   MRN: [id number]  HPI Here for follow up of osteomyelitis He has a lytic lesion of his toe and I started him on empiric daptomycin and ceftriaxone and started on 12/17/21.  No issues with the medication.  No rash or diarrhea.  CK level has remained elevated on daptomycin.  He is having no rash or other concerns.     Review of Systems  Constitutional:  Negative for chills and fever.  Gastrointestinal:  Negative for diarrhea and nausea.  Skin:  Negative for rash.      Objective:   Physical Exam Eyes:     General: No scleral icterus. Pulmonary:     Effort: Pulmonary effort is normal.  Musculoskeletal:     Comments: Toe looks about the same.  No drainage, no warmth.   Skin:    Findings: No rash.  Neurological:     Mental Status: He is alert.   SH: + tobacco       Assessment & Plan:

## 2021-12-30 NOTE — Telephone Encounter (Signed)
Thank you :)

## 2021-12-30 NOTE — Assessment & Plan Note (Signed)
Total CK has remained elevated so will stop daptomycin today and convert to oral linezolid for 2 weeks.  Will continue with lab monitoring for the next 2 weeks.

## 2021-12-30 NOTE — Assessment & Plan Note (Signed)
His hepatitis C RNA is negative, no active infection, no treatment indicated.

## 2021-12-30 NOTE — Assessment & Plan Note (Signed)
This is working well, no issues. Will continue with ceftiaxone through 6/15 and this can be pulled after that.

## 2021-12-31 DIAGNOSIS — M869 Osteomyelitis, unspecified: Secondary | ICD-10-CM | POA: Diagnosis not present

## 2022-01-01 DIAGNOSIS — M869 Osteomyelitis, unspecified: Secondary | ICD-10-CM | POA: Diagnosis not present

## 2022-01-02 DIAGNOSIS — M869 Osteomyelitis, unspecified: Secondary | ICD-10-CM | POA: Diagnosis not present

## 2022-01-03 DIAGNOSIS — M869 Osteomyelitis, unspecified: Secondary | ICD-10-CM | POA: Diagnosis not present

## 2022-01-04 ENCOUNTER — Other Ambulatory Visit: Payer: Self-pay | Admitting: Podiatry

## 2022-01-04 ENCOUNTER — Ambulatory Visit: Payer: BC Managed Care – PPO | Admitting: Podiatry

## 2022-01-04 ENCOUNTER — Telehealth: Payer: Self-pay | Admitting: Podiatry

## 2022-01-04 ENCOUNTER — Encounter: Payer: Self-pay | Admitting: Podiatry

## 2022-01-04 VITALS — Temp 97.1°F

## 2022-01-04 DIAGNOSIS — L03031 Cellulitis of right toe: Secondary | ICD-10-CM | POA: Diagnosis not present

## 2022-01-04 DIAGNOSIS — M869 Osteomyelitis, unspecified: Secondary | ICD-10-CM

## 2022-01-04 DIAGNOSIS — L97519 Non-pressure chronic ulcer of other part of right foot with unspecified severity: Secondary | ICD-10-CM

## 2022-01-04 DIAGNOSIS — L02611 Cutaneous abscess of right foot: Secondary | ICD-10-CM | POA: Diagnosis not present

## 2022-01-04 NOTE — Telephone Encounter (Signed)
Pt was wanting a Prescription for a knee roller and Shower boot.   Please advise.

## 2022-01-04 NOTE — Telephone Encounter (Signed)
Will place in chart. However these are not covered by insurance

## 2022-01-04 NOTE — Progress Notes (Signed)
Subjective:  Patient ID: George Shelton, male    DOB: 1981/02/25,   MRN: [id number]  Chief Complaint  Patient presents with   Foot Pain    The 2nd toe on the right is crooked and not as red or swollen but it needs to come off     41 y.o. male presents for follow-up of right second toe ulceration and infection. He has seen infection disease and finished June 14th. However he is ready to talk about surgery as he thinks he will have some time after June 19th. Would like to discuss surgery. .  Relates it is doing better. Denies any other pedal complaints. Denies n/v/f/c.   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[facility])    HSV-1 (herpes simplex virus 1) infection    HSV-2 (herpes simplex virus 2) infection    Left atrial enlargement    MRSA (methicillin resistant staph aureus) culture positive    Nicotine dependence    Polysubstance abuse (HCC)    Sepsis (HCC)     Objective:  Physical Exam: Vascular: DP/PT pulses 2/4 bilateral. CFT <3 seconds. Normal hair growth on digits. No edema.  Skin. No lacerations or abrasions bilateral feet. Distal second toe ulceration appears nearly healed no active purulence drainage, however there is edema and erythema noted to the right toe.  Musculoskeletal: MMT 5/5 bilateral lower extremities in DF, PF, Inversion and Eversion. Deceased ROM in DF of ankle joint.  Neurological: Sensation intact to light touch.   Assessment:   1. Ulcer of toe of right foot, unspecified ulcer stage (HCC)   2. 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 Cellulitis and abscess of toe of right foot        Plan:  Patient was evaluated and treated and all questions answered. Ulcer right second digit osteomyelitis  X-rays reviewed. Their is osseous destruction noted to distal aspect of the distal phalanx of the second digit.  -No debridement necessary.  -Dressed with betadine, DSD. -Off-loading with sandal. Refused surgical shoe today.    -Continue with ID and has started PICC line  daptomycin 8 mg/kg IV once daily + ceftriaxone 2 grams IV once daily and will finishe June 14th.  -Discussed glucose control and proper protein-rich diet.  -Discussed if any worsening redness, pain, fever or chills to call or may need to report to the emergency room. Patient expressed understanding.  -Discussed with patient continued wound care and IV antibiotics. Patient relates he is finally ready to discuss surgery again and would like to go ahead with TMA of the right foot. Would like to try for June 20th.  Discussed risks of TMA healing and that he would likely have trouble healing due to smoking and non compliance with minimal weight bearing. He expressed understanding and will try his best to comply.  -Informed surgical risk consent was reviewed and read aloud to the patient.  I reviewed the films.  I have discussed my findings with the patient in great detail.  I have discussed all risks including but not limited to infection, stiffness, scarring, limp, disability, deformity, damage to blood vessels and nerves, numbness, poor healing, need for braces, arthritis, chronic pain, amputation, death.  All benefits and realistic expectations discussed in great detail.  I have made no promises as to the outcome.  I have provided realistic expectations.  I have offered the patient a 2nd opinion, which they have declined and assured me they preferred to proceed despite  the risks. -Discussed again if he has any worsening symptoms or any systemic signs of infection to report to emergency room immediately. He did express understanding.  Patient to follow for surgery. June 20th    Return if symptoms worsen or fail to improve.   Louann Sjogren, DPM

## 2022-01-05 ENCOUNTER — Telehealth: Payer: Self-pay | Admitting: Urology

## 2022-01-05 DIAGNOSIS — M869 Osteomyelitis, unspecified: Secondary | ICD-10-CM | POA: Diagnosis not present

## 2022-01-05 NOTE — Telephone Encounter (Signed)
DOS - 01/19/22  TRANSMETATARSAL AMPUTATION RIGHT --- 61443  [employer]BS EFFECTIVE DATE - 08/02/21  PLAN DEDUCTIBLE - $5,500.00 W/ $1,540.08 REMAINING OUT OF POCKET - $9,100.00 W/ $5,481.66 REMAINING COINSURANCE - 50% COPAY - $0.00   NO PRIOR AUTH IS REQUIRED

## 2022-01-06 DIAGNOSIS — M869 Osteomyelitis, unspecified: Secondary | ICD-10-CM | POA: Diagnosis not present

## 2022-01-07 DIAGNOSIS — M869 Osteomyelitis, unspecified: Secondary | ICD-10-CM | POA: Diagnosis not present

## 2022-01-08 DIAGNOSIS — M869 Osteomyelitis, unspecified: Secondary | ICD-10-CM | POA: Diagnosis not present

## 2022-01-09 DIAGNOSIS — M869 Osteomyelitis, unspecified: Secondary | ICD-10-CM | POA: Diagnosis not present

## 2022-01-10 DIAGNOSIS — M869 Osteomyelitis, unspecified: Secondary | ICD-10-CM | POA: Diagnosis not present

## 2022-01-11 DIAGNOSIS — M869 Osteomyelitis, unspecified: Secondary | ICD-10-CM | POA: Diagnosis not present

## 2022-01-12 DIAGNOSIS — M869 Osteomyelitis, unspecified: Secondary | ICD-10-CM | POA: Diagnosis not present

## 2022-01-13 DIAGNOSIS — M869 Osteomyelitis, unspecified: Secondary | ICD-10-CM | POA: Diagnosis not present

## 2022-01-14 ENCOUNTER — Telehealth: Payer: Self-pay

## 2022-01-14 DIAGNOSIS — M869 Osteomyelitis, unspecified: Secondary | ICD-10-CM | POA: Diagnosis not present

## 2022-01-14 NOTE — Telephone Encounter (Signed)
Received call today from George Shelton with home health regarding some concerns. States that George Shelton has done his own picc dressing twice, even after RN told him not to do this. George Shelton is complaining about having nausea, feeling weak, and sweating 2-3 days. RN stated that George Shelton's heart rate was 118 today.  George Shelton does not want to come to the office. Spoke with Dr. Luciana Axe and Dr. Earlene Plater who recommended George Shelton go to Emergency room/ urgent care for evaluation. Also to stay hydrated. Relayed this to George Shelton and recommended he go as soon as possible and not delay this. George Shelton verbalized understanding.  Juanita Laster, RMA

## 2022-01-15 DIAGNOSIS — M869 Osteomyelitis, unspecified: Secondary | ICD-10-CM | POA: Diagnosis not present

## 2022-01-18 ENCOUNTER — Ambulatory Visit: Payer: BC Managed Care – PPO | Admitting: Podiatry

## 2022-01-18 ENCOUNTER — Encounter: Payer: Self-pay | Admitting: Podiatry

## 2022-01-18 DIAGNOSIS — M869 Osteomyelitis, unspecified: Secondary | ICD-10-CM

## 2022-01-18 DIAGNOSIS — L97519 Non-pressure chronic ulcer of other part of right foot with unspecified severity: Secondary | ICD-10-CM | POA: Diagnosis not present

## 2022-01-18 DIAGNOSIS — L03031 Cellulitis of right toe: Secondary | ICD-10-CM

## 2022-01-18 DIAGNOSIS — L02611 Cutaneous abscess of right foot: Secondary | ICD-10-CM | POA: Diagnosis not present

## 2022-01-18 NOTE — Progress Notes (Signed)
  Subjective:  Patient ID: George Shelton, male    DOB: 06-16-1981,   MRN: [id number]  Chief Complaint  Patient presents with   Foot Pain    I am doing a lot better on the 2nd toe right and I have been staying off of it and taken the antibiotics    41 y.o. male presents for follow-up of right second toe ulceration and infection. Ready for surgery tomorrow.   Relates it is doing better. Denies any other pedal complaints. Denies n/v/f/c.   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[facility])    HSV-1 (herpes simplex virus 1) infection    HSV-2 (herpes simplex virus 2) infection    Left atrial enlargement    MRSA (methicillin resistant staph aureus) culture positive    Nicotine dependence    Polysubstance abuse (HCC)    Sepsis (HCC)     Objective:  Physical Exam: Vascular: DP/PT pulses 2/4 bilateral. CFT <3 seconds. Normal hair growth on digits. No edema.  Skin. No lacerations or abrasions bilateral feet. Distal second toe ulceration appears nearly healed no active purulence drainage, however there is edema and erythema noted to the right toe.  Musculoskeletal: MMT 5/5 bilateral lower extremities in DF, PF, Inversion and Eversion. Deceased ROM in DF of ankle joint.  Neurological: Sensation intact to light touch.   Assessment:   1. Ulcer of toe of right foot, unspecified ulcer stage (HCC)   2. 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 Cellulitis and abscess of toe of right foot         Plan:  Patient was evaluated and treated and all questions answered. Ulcer right second digit osteomyelitis  X-rays reviewed. Their is osseous destruction noted to distal aspect of the distal phalanx of the second digit.  -No debridement necessary.  -Dressed with betadine, DSD. -Off-loading with sandal. Refused surgical shoe today.   -Has been taking off PICC line.  -Refuses any narcotics prior to surgery.  -Surgery scheduled for tomorrow.    No follow-ups on  file.   Louann Sjogren, DPM

## 2022-01-19 ENCOUNTER — Other Ambulatory Visit: Payer: Self-pay | Admitting: Podiatry

## 2022-01-19 DIAGNOSIS — M86171 Other acute osteomyelitis, right ankle and foot: Secondary | ICD-10-CM | POA: Diagnosis not present

## 2022-01-19 DIAGNOSIS — L905 Scar conditions and fibrosis of skin: Secondary | ICD-10-CM | POA: Diagnosis not present

## 2022-01-19 DIAGNOSIS — L97519 Non-pressure chronic ulcer of other part of right foot with unspecified severity: Secondary | ICD-10-CM | POA: Diagnosis not present

## 2022-01-19 DIAGNOSIS — M869 Osteomyelitis, unspecified: Secondary | ICD-10-CM | POA: Diagnosis not present

## 2022-01-19 MED ORDER — CEPHALEXIN 500 MG PO CAPS: 500.0000 mg | ORAL_CAPSULE | Freq: Four times a day (QID) | ORAL | 0 refills | Status: AC

## 2022-01-19 MED ORDER — ONDANSETRON HCL 4 MG PO TABS: 4.0000 mg | ORAL_TABLET | Freq: Three times a day (TID) | ORAL | 0 refills | Status: DC | PRN

## 2022-01-19 MED ORDER — IBUPROFEN 800 MG PO TABS: 800.0000 mg | ORAL_TABLET | Freq: Three times a day (TID) | ORAL | 0 refills | Status: DC | PRN

## 2022-01-22 ENCOUNTER — Ambulatory Visit (INDEPENDENT_AMBULATORY_CARE_PROVIDER_SITE_OTHER): Payer: BC Managed Care – PPO | Admitting: Podiatry

## 2022-01-22 ENCOUNTER — Ambulatory Visit: Payer: BC Managed Care – PPO | Admitting: Podiatry

## 2022-01-22 ENCOUNTER — Encounter: Payer: Self-pay | Admitting: Podiatry

## 2022-01-22 DIAGNOSIS — Z9889 Other specified postprocedural states: Secondary | ICD-10-CM

## 2022-01-22 MED ORDER — KETOROLAC TROMETHAMINE 10 MG PO TABS
10.0000 mg | ORAL_TABLET | Freq: Four times a day (QID) | ORAL | 0 refills | Status: DC | PRN
Start: 2022-01-22 — End: 2022-02-06

## 2022-01-25 ENCOUNTER — Ambulatory Visit (INDEPENDENT_AMBULATORY_CARE_PROVIDER_SITE_OTHER): Payer: BC Managed Care – PPO

## 2022-01-25 ENCOUNTER — Encounter: Payer: Self-pay | Admitting: Podiatry

## 2022-01-25 ENCOUNTER — Ambulatory Visit (INDEPENDENT_AMBULATORY_CARE_PROVIDER_SITE_OTHER): Payer: BC Managed Care – PPO | Admitting: Podiatry

## 2022-01-25 VITALS — Temp 97.9°F

## 2022-01-25 DIAGNOSIS — Z9889 Other specified postprocedural states: Secondary | ICD-10-CM

## 2022-01-25 DIAGNOSIS — S98131A Complete traumatic amputation of one right lesser toe, initial encounter: Secondary | ICD-10-CM

## 2022-01-25 MED ORDER — DOXYCYCLINE HYCLATE 100 MG PO TABS: 100.0000 mg | ORAL_TABLET | Freq: Two times a day (BID) | ORAL | 0 refills | Status: AC

## 2022-01-29 ENCOUNTER — Encounter: Payer: Self-pay | Admitting: Podiatry

## 2022-02-04 ENCOUNTER — Telehealth: Payer: Self-pay | Admitting: Podiatry

## 2022-02-04 NOTE — Telephone Encounter (Signed)
Pt is asking for a RX refill for pain and wanting a new Surgical boot because its uncomfortable feels dirty and feels like something inside it is stabbing him.  Please advise

## 2022-02-05 ENCOUNTER — Encounter: Payer: Self-pay | Admitting: Podiatry

## 2022-02-05 NOTE — Telephone Encounter (Signed)
Please advise .. Dr.sikora is on vacation

## 2022-02-06 ENCOUNTER — Other Ambulatory Visit: Payer: Self-pay | Admitting: Podiatry

## 2022-02-06 MED ORDER — KETOROLAC TROMETHAMINE 10 MG PO TABS: 10.0000 mg | ORAL_TABLET | Freq: Four times a day (QID) | ORAL | 0 refills | Status: AC | PRN

## 2022-02-06 NOTE — Telephone Encounter (Signed)
Refill sent to the pharmacy based on Dr. Dionne Bucy last prescription of Toradol.  Thanks, Dr. Logan Bores

## 2022-02-08 ENCOUNTER — Ambulatory Visit (INDEPENDENT_AMBULATORY_CARE_PROVIDER_SITE_OTHER): Payer: BC Managed Care – PPO | Admitting: Podiatry

## 2022-02-08 DIAGNOSIS — Z9889 Other specified postprocedural states: Secondary | ICD-10-CM

## 2022-02-08 NOTE — Progress Notes (Signed)
  Subjective:  Patient ID: George Shelton, male    DOB: 12-14-1980,  MRN: [id number]  Chief Complaint  Patient presents with   Routine Post Op    POV #2 DOS 01/19/2022 RT FOOT TRANSMETATARSAL AMPUTATION    DOS: 01/19/22 Procedure: Right transmetatarsal amputation   41 y.o. male returns for POV#3. Relates doing well and pain controlled today. He has not been 100% compliant with NWB. He has been active at work.    Review of Systems: Negative except as noted in the HPI. Denies N/V/F/Ch.  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[facility])    HSV-1 (herpes simplex virus 1) infection    HSV-2 (herpes simplex virus 2) infection    Left atrial enlargement    MRSA (methicillin resistant staph aureus) culture positive    Nicotine dependence    Polysubstance abuse (HCC)    Sepsis (HCC)     Current Outpatient Medications:    doxycycline (VIBRA-TABS) 100 MG tablet, Take 1 tablet (100 mg total) by mouth 2 (two) times daily for 14 days., Disp: 28 tablet, Rfl: 0   gabapentin (NEURONTIN) 600 MG tablet, Take 1 tablet (600 mg total) by mouth 3 (three) times daily., Disp: 90 tablet, Rfl: 2   ketorolac (TORADOL) 10 MG tablet, Take 1 tablet (10 mg total) by mouth every 6 (six) hours as needed for up to 5 days., Disp: 20 tablet, Rfl: 0   linezolid (ZYVOX) 600 MG tablet, Take 1 tablet (600 mg total) by mouth 2 (two) times daily., Disp: 28 tablet, Rfl: 0   nebivolol (BYSTOLIC) 5 MG tablet, Take 1 tablet (5 mg total) by mouth daily., Disp: 90 tablet, Rfl: 3   ondansetron (ZOFRAN) 4 MG tablet, Take 1 tablet (4 mg total) by mouth every 8 (eight) hours as needed for nausea or vomiting., Disp: 20 tablet, Rfl: 0  Social History   Tobacco Use  Smoking Status Every Day   Packs/day: 1.50   Types: Cigarettes  Smokeless Tobacco Current   Types: Chew    Allergies  Allergen Reactions   Codeine    Other     History of drug addiction. Does not want pain medication of any kind. Sober/clean for 8  1/2 years.   Vancomycin    Objective:  There were no vitals filed for this visit. There is no height or weight on file to calculate BMI. Constitutional Well developed. Well nourished.  Vascular Foot warm and well perfused. Capillary refill normal to all digits.   Neurologic Normal speech. Oriented to person, place, and time. Epicritic sensation to light touch grossly present bilaterally.  Dermatologic Skin healing well without signs of infection. Skin edges well coapted without signs of infection.He does have a small area with early dehiscence and some medial  around incision. No erythema noted but edema around foot. No active bleeding noted.   Orthopedic: Tenderness to palpation noted about the surgical site.   Assessment:   1. Post-operative state    Plan:  Patient was evaluated and treated and all questions answered.  S/p foot surgery right -Progressing as expected post-operatively. -WB Status: NWB in surgical shoe -Staples: some staples removed. Some areas of dehiscence to medial and lateral inciison.  -Medications: n/a continue toradol -Foot redressed. Will follow-up in two weeks  Return in about 2 weeks (around 02/22/2022) for post op.

## 2022-02-16 DIAGNOSIS — T50991A Poisoning by other drugs, medicaments and biological substances, accidental (unintentional), initial encounter: Secondary | ICD-10-CM | POA: Diagnosis not present

## 2022-02-16 DIAGNOSIS — R4020 Unspecified coma: Secondary | ICD-10-CM | POA: Diagnosis not present

## 2022-02-16 DIAGNOSIS — R404 Transient alteration of awareness: Secondary | ICD-10-CM | POA: Diagnosis not present

## 2022-02-16 DIAGNOSIS — R0681 Apnea, not elsewhere classified: Secondary | ICD-10-CM | POA: Diagnosis not present

## 2022-02-16 DIAGNOSIS — F1510 Other stimulant abuse, uncomplicated: Secondary | ICD-10-CM | POA: Diagnosis not present

## 2022-02-16 DIAGNOSIS — F1910 Other psychoactive substance abuse, uncomplicated: Secondary | ICD-10-CM | POA: Diagnosis not present

## 2022-02-16 DIAGNOSIS — R410 Disorientation, unspecified: Secondary | ICD-10-CM | POA: Diagnosis not present

## 2022-02-16 DIAGNOSIS — F17210 Nicotine dependence, cigarettes, uncomplicated: Secondary | ICD-10-CM | POA: Diagnosis not present

## 2022-02-22 ENCOUNTER — Encounter: Payer: Self-pay | Admitting: Podiatry

## 2022-02-22 ENCOUNTER — Ambulatory Visit (INDEPENDENT_AMBULATORY_CARE_PROVIDER_SITE_OTHER): Payer: BC Managed Care – PPO | Admitting: Podiatry

## 2022-02-22 DIAGNOSIS — Z9889 Other specified postprocedural states: Secondary | ICD-10-CM

## 2022-02-22 MED ORDER — GABAPENTIN 300 MG PO CAPS: 300.0000 mg | ORAL_CAPSULE | Freq: Every day | ORAL | 3 refills | Status: DC

## 2022-02-22 NOTE — Progress Notes (Signed)
  Subjective:  Patient ID: George Shelton, male    DOB: 03-28-1981,  MRN: [id number]  No chief complaint on file.   DOS: 01/19/22 Procedure: Right transmetatarsal amputation   41 y.o. male returns for POV#3. Relates doing well and pain controlled today. He has not been compliant with NWB. He has been active at work.    Review of Systems: Negative except as noted in the HPI. Denies N/V/F/Ch.  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[facility])    HSV-1 (herpes simplex virus 1) infection    HSV-2 (herpes simplex virus 2) infection    Left atrial enlargement    MRSA (methicillin resistant staph aureus) culture positive    Nicotine dependence    Polysubstance abuse (HCC)    Sepsis (HCC)     Current Outpatient Medications:    gabapentin (NEURONTIN) 600 MG tablet, Take 1 tablet (600 mg total) by mouth 3 (three) times daily., Disp: 90 tablet, Rfl: 2   linezolid (ZYVOX) 600 MG tablet, Take 1 tablet (600 mg total) by mouth 2 (two) times daily., Disp: 28 tablet, Rfl: 0   nebivolol (BYSTOLIC) 5 MG tablet, Take 1 tablet (5 mg total) by mouth daily., Disp: 90 tablet, Rfl: 3   ondansetron (ZOFRAN) 4 MG tablet, Take 1 tablet (4 mg total) by mouth every 8 (eight) hours as needed for nausea or vomiting., Disp: 20 tablet, Rfl: 0  Social History   Tobacco Use  Smoking Status Every Day   Packs/day: 1.50   Types: Cigarettes  Smokeless Tobacco Current   Types: Chew    Allergies  Allergen Reactions   Codeine    Other     History of drug addiction. Does not want pain medication of any kind. Sober/clean for 8 1/2 years.   Vancomycin    Objective:  There were no vitals filed for this visit. There is no height or weight on file to calculate BMI. Constitutional Well developed. Well nourished.  Vascular Foot warm and well perfused. Capillary refill normal to all digits.   Neurologic Normal speech. Oriented to person, place, and time. Epicritic sensation to light touch grossly  present bilaterally.  Dermatologic Skin healing well without signs of infection. Skin edges well coapted without signs of infection.Areas of dehiscence have healed and doing well. Some edema noted to the forefoot.   Orthopedic: Tenderness to palpation noted about the surgical site.   Assessment:   1. Post-operative state    Plan:  Patient was evaluated and treated and all questions answered.  S/p foot surgery right -Progressing as expected post-operatively. -WB Status: WBAT in surgical shoe, although patient has been walking in [employer] -Staples: additional staples removed.  -Medications: gabapentin sent to pharmacy  -Foot redressed. Will follow-up in four weeks  No follow-ups on file.

## 2022-03-13 ENCOUNTER — Inpatient Hospital Stay: Admission: AD | Admit: 2022-03-13 | Payer: BC Managed Care – PPO | Source: Other Acute Inpatient Hospital

## 2022-03-13 ENCOUNTER — Encounter (HOSPITAL_COMMUNITY): Payer: Self-pay

## 2022-03-13 DIAGNOSIS — X58XXXA Exposure to other specified factors, initial encounter: Secondary | ICD-10-CM | POA: Diagnosis not present

## 2022-03-13 DIAGNOSIS — M79671 Pain in right foot: Secondary | ICD-10-CM | POA: Diagnosis not present

## 2022-03-13 DIAGNOSIS — T148XXA Other injury of unspecified body region, initial encounter: Secondary | ICD-10-CM | POA: Diagnosis not present

## 2022-03-13 DIAGNOSIS — M25571 Pain in right ankle and joints of right foot: Secondary | ICD-10-CM | POA: Diagnosis not present

## 2022-03-13 DIAGNOSIS — S99921A Unspecified injury of right foot, initial encounter: Secondary | ICD-10-CM | POA: Diagnosis not present

## 2022-03-13 DIAGNOSIS — L0291 Cutaneous abscess, unspecified: Secondary | ICD-10-CM | POA: Diagnosis not present

## 2022-03-13 DIAGNOSIS — M79604 Pain in right leg: Secondary | ICD-10-CM | POA: Diagnosis not present

## 2022-03-13 DIAGNOSIS — L02611 Cutaneous abscess of right foot: Secondary | ICD-10-CM | POA: Diagnosis not present

## 2022-03-13 DIAGNOSIS — M869 Osteomyelitis, unspecified: Secondary | ICD-10-CM | POA: Diagnosis not present

## 2022-03-13 DIAGNOSIS — T796XXA Traumatic ischemia of muscle, initial encounter: Secondary | ICD-10-CM | POA: Diagnosis not present

## 2022-03-13 DIAGNOSIS — M7989 Other specified soft tissue disorders: Secondary | ICD-10-CM | POA: Diagnosis not present

## 2022-03-13 DIAGNOSIS — S9781XA Crushing injury of right foot, initial encounter: Secondary | ICD-10-CM | POA: Diagnosis not present

## 2022-03-14 ENCOUNTER — Telehealth: Payer: Self-pay | Admitting: Podiatry

## 2022-03-14 DIAGNOSIS — M869 Osteomyelitis, unspecified: Secondary | ICD-10-CM | POA: Diagnosis not present

## 2022-03-14 DIAGNOSIS — T796XXA Traumatic ischemia of muscle, initial encounter: Secondary | ICD-10-CM | POA: Diagnosis not present

## 2022-03-14 DIAGNOSIS — L0291 Cutaneous abscess, unspecified: Secondary | ICD-10-CM | POA: Diagnosis not present

## 2022-03-14 NOTE — Telephone Encounter (Signed)
Patient called answering service and is still at [facility].  I previously discussed his case on 03/13/2022 with the ER provider there, he reinjured his amputation site and was in severe pain, there was evidence of possible rhabdomyolysis and some but not definite concern for compartment syndrome.  CT also showed possibly new abscess and osteomyelitis.  I discussed with him we could manage the osteomyelitis and abscess at [facility] but that the compartment syndrome needs to be evaluated there.  Spoke with Dr. Elmer Picker from [facility] shortly thereafter, he was also concerned that compartment syndrome needed to be completely ruled out prior to transfer and he recommended to the ER that orthopedics see the patient which I agree with.  The patient called the answering service this morning at 0630, he told me he is still in the ER and he is concerned about the lack of care he is getting.  He did confirm he is getting antibiotics, he is not sure that anyone from orthopedics came to see him.  He did state that they are no longer concerned about compartment syndrome.  He said he was thinking about leaving AMA and coming to the [facility] ER.  I discussed with him that likely will not give him any sort of advantage because it will interrupt his antibiotic administration as well as having to wait through triage in the ER here as well, chart shows that transfer has been accepted we are just waiting on bed placement.  I will see him when he is transferred here

## 2022-03-15 ENCOUNTER — Emergency Department (HOSPITAL_COMMUNITY): Payer: BC Managed Care – PPO

## 2022-03-15 ENCOUNTER — Ambulatory Visit (INDEPENDENT_AMBULATORY_CARE_PROVIDER_SITE_OTHER): Payer: BC Managed Care – PPO

## 2022-03-15 ENCOUNTER — Encounter: Payer: Self-pay | Admitting: Podiatry

## 2022-03-15 ENCOUNTER — Ambulatory Visit (INDEPENDENT_AMBULATORY_CARE_PROVIDER_SITE_OTHER): Payer: BC Managed Care – PPO | Admitting: Podiatry

## 2022-03-15 ENCOUNTER — Other Ambulatory Visit: Payer: Self-pay

## 2022-03-15 ENCOUNTER — Encounter (HOSPITAL_COMMUNITY): Payer: Self-pay | Admitting: Emergency Medicine

## 2022-03-15 ENCOUNTER — Inpatient Hospital Stay (HOSPITAL_COMMUNITY)
Admission: EM | Admit: 2022-03-15 | Discharge: 2022-03-18 | DRG: 478 | Disposition: A | Discharge status: Home / Self Care | Payer: BC Managed Care – PPO | Attending: Internal Medicine | Admitting: Internal Medicine

## 2022-03-15 DIAGNOSIS — Z881 Allergy status to other antibiotic agents status: Secondary | ICD-10-CM | POA: Diagnosis not present

## 2022-03-15 DIAGNOSIS — Z91199 Patient's noncompliance with other medical treatment and regimen due to unspecified reason: Secondary | ICD-10-CM | POA: Diagnosis not present

## 2022-03-15 DIAGNOSIS — W230XXA Caught, crushed, jammed, or pinched between moving objects, initial encounter: Secondary | ICD-10-CM | POA: Diagnosis present

## 2022-03-15 DIAGNOSIS — F17220 Nicotine dependence, chewing tobacco, uncomplicated: Secondary | ICD-10-CM | POA: Diagnosis not present

## 2022-03-15 DIAGNOSIS — Z8249 Family history of ischemic heart disease and other diseases of the circulatory system: Secondary | ICD-10-CM | POA: Diagnosis not present

## 2022-03-15 DIAGNOSIS — Y92009 Unspecified place in unspecified non-institutional (private) residence as the place of occurrence of the external cause: Secondary | ICD-10-CM

## 2022-03-15 DIAGNOSIS — M86171 Other acute osteomyelitis, right ankle and foot: Secondary | ICD-10-CM | POA: Diagnosis not present

## 2022-03-15 DIAGNOSIS — F1910 Other psychoactive substance abuse, uncomplicated: Secondary | ICD-10-CM | POA: Diagnosis present

## 2022-03-15 DIAGNOSIS — Z89431 Acquired absence of right foot: Secondary | ICD-10-CM

## 2022-03-15 DIAGNOSIS — M7989 Other specified soft tissue disorders: Secondary | ICD-10-CM | POA: Diagnosis not present

## 2022-03-15 DIAGNOSIS — R600 Localized edema: Secondary | ICD-10-CM | POA: Diagnosis not present

## 2022-03-15 DIAGNOSIS — L089 Local infection of the skin and subcutaneous tissue, unspecified: Secondary | ICD-10-CM

## 2022-03-15 DIAGNOSIS — I10 Essential (primary) hypertension: Secondary | ICD-10-CM | POA: Diagnosis not present

## 2022-03-15 DIAGNOSIS — S9781XA Crushing injury of right foot, initial encounter: Secondary | ICD-10-CM

## 2022-03-15 DIAGNOSIS — L97519 Non-pressure chronic ulcer of other part of right foot with unspecified severity: Secondary | ICD-10-CM

## 2022-03-15 DIAGNOSIS — Z79899 Other long term (current) drug therapy: Secondary | ICD-10-CM

## 2022-03-15 DIAGNOSIS — Z9889 Other specified postprocedural states: Secondary | ICD-10-CM | POA: Diagnosis not present

## 2022-03-15 DIAGNOSIS — Z8614 Personal history of Methicillin resistant Staphylococcus aureus infection: Secondary | ICD-10-CM

## 2022-03-15 DIAGNOSIS — Z833 Family history of diabetes mellitus: Secondary | ICD-10-CM | POA: Diagnosis not present

## 2022-03-15 DIAGNOSIS — L03115 Cellulitis of right lower limb: Secondary | ICD-10-CM

## 2022-03-15 DIAGNOSIS — Y990 Civilian activity done for income or pay: Secondary | ICD-10-CM

## 2022-03-15 DIAGNOSIS — T8743 Infection of amputation stump, right lower extremity: Secondary | ICD-10-CM | POA: Diagnosis not present

## 2022-03-15 DIAGNOSIS — T447X6A Underdosing of beta-adrenoreceptor antagonists, initial encounter: Secondary | ICD-10-CM | POA: Diagnosis not present

## 2022-03-15 DIAGNOSIS — Z91128 Patient's intentional underdosing of medication regimen for other reason: Secondary | ICD-10-CM

## 2022-03-15 DIAGNOSIS — Z87891 Personal history of nicotine dependence: Secondary | ICD-10-CM | POA: Diagnosis not present

## 2022-03-15 DIAGNOSIS — Z800 Family history of malignant neoplasm of digestive organs: Secondary | ICD-10-CM

## 2022-03-15 DIAGNOSIS — G4733 Obstructive sleep apnea (adult) (pediatric): Secondary | ICD-10-CM | POA: Diagnosis present

## 2022-03-15 DIAGNOSIS — Z808 Family history of malignant neoplasm of other organs or systems: Secondary | ICD-10-CM

## 2022-03-15 DIAGNOSIS — M86671 Other chronic osteomyelitis, right ankle and foot: Principal | ICD-10-CM | POA: Diagnosis present

## 2022-03-15 DIAGNOSIS — L02611 Cutaneous abscess of right foot: Secondary | ICD-10-CM | POA: Diagnosis not present

## 2022-03-15 DIAGNOSIS — Z885 Allergy status to narcotic agent status: Secondary | ICD-10-CM

## 2022-03-15 DIAGNOSIS — M869 Osteomyelitis, unspecified: Secondary | ICD-10-CM | POA: Diagnosis not present

## 2022-03-15 DIAGNOSIS — T148XXA Other injury of unspecified body region, initial encounter: Secondary | ICD-10-CM | POA: Diagnosis not present

## 2022-03-15 DIAGNOSIS — E876 Hypokalemia: Secondary | ICD-10-CM | POA: Diagnosis present

## 2022-03-15 HISTORY — DX: Crushing injury of right foot, initial encounter: S97.81XA

## 2022-03-15 HISTORY — DX: Cellulitis of right lower limb: L03.115

## 2022-03-15 LAB — COMPREHENSIVE METABOLIC PANEL
ALT: 49 U/L — ABNORMAL HIGH (ref 0–44)
AST: 43 U/L — ABNORMAL HIGH (ref 15–41)
Albumin: 3.9 g/dL (ref 3.5–5.0)
Alkaline Phosphatase: 66 U/L (ref 38–126)
Anion gap: 7 (ref 5–15)
BUN: 9 mg/dL (ref 6–20)
CO2: 25 mmol/L (ref 22–32)
Calcium: 8.7 mg/dL — ABNORMAL LOW (ref 8.9–10.3)
Chloride: 108 mmol/L (ref 98–111)
Creatinine, Ser: 0.97 mg/dL (ref 0.61–1.24)
GFR, Estimated: >60 mL/min (ref >60)
Glucose, Bld: 86 mg/dL (ref 70–99)
Potassium: 3.2 mmol/L — ABNORMAL LOW (ref 3.5–5.1)
Sodium: 140 mmol/L (ref 135–145)
Total Bilirubin: 0.8 mg/dL (ref 0.3–1.2)
Total Protein: 6.7 g/dL (ref 6.5–8.1)

## 2022-03-15 LAB — CBC WITH DIFFERENTIAL/PLATELET
Abs Immature Granulocytes: 0.01 10*3/uL (ref 0.00–0.07)
Basophils Absolute: 0 10*3/uL (ref 0.0–0.1)
Basophils Relative: 1 %
Eosinophils Absolute: 0.2 10*3/uL (ref 0.0–0.5)
Eosinophils Relative: 3 %
HCT: 37.9 % — ABNORMAL LOW (ref 39.0–52.0)
Hemoglobin: 13.1 g/dL (ref 13.0–17.0)
Immature Granulocytes: 0 %
Lymphocytes Relative: 12 %
Lymphs Abs: 0.8 10*3/uL (ref 0.7–4.0)
MCH: 30.2 pg (ref 26.0–34.0)
MCHC: 34.6 g/dL (ref 30.0–36.0)
MCV: 87.3 fL (ref 80.0–100.0)
Monocytes Absolute: 0.5 10*3/uL (ref 0.1–1.0)
Monocytes Relative: 8 %
Neutro Abs: 4.7 10*3/uL (ref 1.7–7.7)
Neutrophils Relative %: 76 %
Platelets: 226 10*3/uL (ref 150–400)
RBC: 4.34 MIL/uL (ref 4.22–5.81)
RDW: 13.3 % (ref 11.5–15.5)
WBC: 6.1 10*3/uL (ref 4.0–10.5)
nRBC: 0 % (ref 0.0–0.2)

## 2022-03-15 LAB — URINALYSIS, ROUTINE W REFLEX MICROSCOPIC
Bilirubin Urine: NEGATIVE
Glucose, UA: NEGATIVE mg/dL
Hgb urine dipstick: NEGATIVE
Ketones, ur: 5 mg/dL — AB
Leukocytes,Ua: NEGATIVE
Nitrite: NEGATIVE
Protein, ur: NEGATIVE mg/dL
Specific Gravity, Urine: 1.014 (ref 1.005–1.030)
pH: 7 (ref 5.0–8.0)

## 2022-03-15 LAB — C-REACTIVE PROTEIN: CRP: 0.7 mg/dL (ref <1.0)

## 2022-03-15 LAB — LACTIC ACID, PLASMA: Lactic Acid, Venous: 0.7 mmol/L (ref 0.5–1.9)

## 2022-03-15 LAB — PROTIME-INR
INR: 0.9 (ref 0.8–1.2)
Prothrombin Time: 12.3 seconds (ref 11.4–15.2)

## 2022-03-15 LAB — SEDIMENTATION RATE: Sed Rate: 10 mm/hr (ref 0–16)

## 2022-03-15 LAB — APTT: aPTT: 29 seconds (ref 24–36)

## 2022-03-15 MED ORDER — LINEZOLID 600 MG/300ML IV SOLN
600.0000 mg | Freq: Two times a day (BID) | INTRAVENOUS | Status: DC
  Administered 2022-03-15 – 2022-03-17 (×5): 600 mg via INTRAVENOUS
  Filled 2022-03-15 (×6): qty 300

## 2022-03-15 MED ORDER — KETOROLAC TROMETHAMINE 15 MG/ML IJ SOLN
15.0000 mg | Freq: Four times a day (QID) | INTRAMUSCULAR | Status: DC | PRN
Start: 2022-03-15 — End: 2022-03-16
  Administered 2022-03-16: 15 mg via INTRAVENOUS
  Filled 2022-03-15: qty 1

## 2022-03-15 MED ORDER — ACETAMINOPHEN 325 MG PO TABS
650.0000 mg | ORAL_TABLET | Freq: Four times a day (QID) | ORAL | Status: DC | PRN
  Filled 2022-03-15: qty 2

## 2022-03-15 MED ORDER — GABAPENTIN 300 MG PO CAPS
300.0000 mg | ORAL_CAPSULE | Freq: Every day | ORAL | Status: DC
  Administered 2022-03-15 – 2022-03-16 (×2): 300 mg via ORAL
  Filled 2022-03-15 (×2): qty 1

## 2022-03-15 MED ORDER — LORAZEPAM 2 MG/ML IJ SOLN
1.0000 mg | Freq: Once | INTRAMUSCULAR | Status: AC | PRN
  Administered 2022-03-15: 1 mg via INTRAVENOUS
  Filled 2022-03-15: qty 1

## 2022-03-15 MED ORDER — SODIUM CHLORIDE 0.9 % IV SOLN
2.0000 g | Freq: Once | INTRAVENOUS | Status: AC
  Administered 2022-03-15: 2 g via INTRAVENOUS
  Filled 2022-03-15: qty 12.5

## 2022-03-15 MED ORDER — ONDANSETRON HCL 4 MG PO TABS: 4.0000 mg | ORAL_TABLET | Freq: Four times a day (QID) | ORAL | Status: DC | PRN

## 2022-03-15 MED ORDER — LACTATED RINGERS IV SOLN: INTRAVENOUS | Status: DC

## 2022-03-15 MED ORDER — POLYETHYLENE GLYCOL 3350 17 G PO PACK: 17.0000 g | PACK | Freq: Every day | ORAL | Status: DC | PRN

## 2022-03-15 MED ORDER — KETOROLAC TROMETHAMINE 15 MG/ML IJ SOLN
15.0000 mg | Freq: Once | INTRAMUSCULAR | Status: AC
  Administered 2022-03-15: 15 mg via INTRAVENOUS
  Filled 2022-03-15: qty 1

## 2022-03-15 MED ORDER — ACETAMINOPHEN 650 MG RE SUPP: 650.0000 mg | Freq: Four times a day (QID) | RECTAL | Status: DC | PRN

## 2022-03-15 MED ORDER — POTASSIUM CHLORIDE CRYS ER 20 MEQ PO TBCR
40.0000 meq | EXTENDED_RELEASE_TABLET | Freq: Once | ORAL | Status: AC
Start: 2022-03-15 — End: 2022-03-15
  Administered 2022-03-15: 40 meq via ORAL
  Filled 2022-03-15: qty 2

## 2022-03-15 MED ORDER — VANCOMYCIN HCL 2000 MG/400ML IV SOLN
2000.0000 mg | Freq: Once | INTRAVENOUS | Status: DC
  Filled 2022-03-15: qty 400

## 2022-03-15 MED ORDER — ONDANSETRON HCL 4 MG/2ML IJ SOLN: 4.0000 mg | Freq: Four times a day (QID) | INTRAMUSCULAR | Status: DC | PRN

## 2022-03-15 NOTE — Assessment & Plan Note (Signed)
   Clinical concern over developing infectious complications since traumatic injury to the foot  Foot has developed progressively worsening edema warmth and and redness  Assessment has proven difficult as this development could purely be due to the trauma itself or could be due to an infectious process  Considering patient's history of frequent MRSA infections along with substantial pain and swelling patient has been placed on empiric intravenous antibiotics with the EDP which will be continued at this time  Blood cultures and inflammatory markers obtained  Awaiting results of MRI of the foot to evaluate for any evidence of abscess formation, osteomyelitis or other evidence of substantial injury  EDP discussed case with Dr. Ardelle Anton with podiatry.  They will consult in the morning with potential for operative intervention  Keeping patient n.p.o. after midnight  Hydrating with intravenous fluids

## 2022-03-15 NOTE — Progress Notes (Signed)
Subjective:  Patient ID: George Shelton, male    DOB: 10/19/1980,  MRN: [id number]  Chief Complaint  Patient presents with   Post-op Problem    DOS: 01/19/22 Procedure: Right transmetatarsal amputation   41 y.o. male returns urgently for POV#4. Relates on Saturday he was working and hit his right foot with a skid steer toward the back of his ankle. He immediatlely went to [facility] ED. Relates there he was worked up for compartment syndrome and infection and told he had possible osteo and abscess in his right foot. He had called into our office and spoke to Dr. Lilian Kapur on call and was ultimately supposed to get transferred to [facility] health, however he did not wait for the transfer and ended up leaving AMA. Presented urgently to our clinic this morning for concern worsening of right foot. Relates pain is manageable but concerned about redness. Has been in regular shoes and no dressing to the foot.   Review of Systems: Negative except as noted in the HPI. Denies N/V/F/Ch.  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[facility])    HSV-1 (herpes simplex virus 1) infection    HSV-2 (herpes simplex virus 2) infection    Left atrial enlargement    MRSA (methicillin resistant staph aureus) culture positive    Nicotine dependence    Polysubstance abuse (HCC)    Sepsis (HCC)     Current Outpatient Medications:    gabapentin (NEURONTIN) 300 MG capsule, Take 1 capsule (300 mg total) by mouth at bedtime., Disp: 90 capsule, Rfl: 3   gabapentin (NEURONTIN) 600 MG tablet, Take 1 tablet (600 mg total) by mouth 3 (three) times daily., Disp: 90 tablet, Rfl: 2   linezolid (ZYVOX) 600 MG tablet, Take 1 tablet (600 mg total) by mouth 2 (two) times daily., Disp: 28 tablet, Rfl: 0   nebivolol (BYSTOLIC) 5 MG tablet, Take 1 tablet (5 mg total) by mouth daily., Disp: 90 tablet, Rfl: 3   ondansetron (ZOFRAN) 4 MG tablet, Take 1 tablet (4 mg total) by mouth every 8 (eight) hours as needed for nausea or  vomiting., Disp: 20 tablet, Rfl: 0  Social History   Tobacco Use  Smoking Status Every Day   Packs/day: 1.50   Types: Cigarettes  Smokeless Tobacco Current   Types: Chew    Allergies  Allergen Reactions   Codeine    Other     History of drug addiction. Does not want pain medication of any kind. Sober/clean for 8 1/2 years.   Vancomycin    Objective:  There were no vitals filed for this visit. There is no height or weight on file to calculate BMI. Constitutional Well developed. Well nourished.  Vascular Foot warm and well perfused. Capillary refill normal to all digits.   Neurologic Normal speech. Oriented to person, place, and time. Epicritic sensation to light touch grossly present bilaterally.  Dermatologic Right foot dorsum with abrasion and eccymosis noted around ankle and plantar and dorsal foot. Dehsicence noted to distal amputation site with surrounding erythema and edema noted. No malodor. No probe to bone. Some edema noted to the forefoot.   Orthopedic: Tenderness to palpation noted about the surgical site.     Radiographs with some possible erosive changes noted to distal metatarsals 2 and 5 and hypertrophic changes noted to all other metatarsals.   Assessment:   1. Post-operative state    Plan:  Patient was evaluated and treated and all questions answered.  S/p foot surgery right -  Discussed with patient concern for worsening of right foot and with his history of previous MRSA infections discussed returning to the ED to be readmitted to the hospital for IV antibiotics as well as MRI for further evaluation and possible revision surgery. Discussed with him it was important to wait and that it will be safer and in his best interest to do this inpatient as opposed to trying to treat outpatient.  -WB Status: WBAT in surgical shoe, although patient has been walking in [employer] -Foot redressed. Discussed case with Dr. Ardelle Anton on call doctor.  Will follow-up after  hospital course.   No follow-ups on file.

## 2022-03-15 NOTE — ED Provider Notes (Signed)
[facility] Provider Note  CSN: [id number] Arrival date & time: 03/15/22 1735  Chief Complaint(s) Wound Infection  HPI George Shelton is a 41 y.o. male with PMH polysubstance abuse, osteomyelitis status post transmetatarsal amputation who presents emergency department for evaluation of a suspected wound infection.  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  He went to the [facility] emergency department at that time and apparently was told that he had osteomyelitis and an abscess in his right foot.  He was slated for direct admission to [facility] for surgical repair for at least a stump revision but states that he waited in the emergency department for multiple days and then left AMA due to the long wait time.  He then followed up in the clinic today and spoke with Dr. Louann Sjogrenebecca Sikora at [facility] who recommended that the patient come to [facility] for admission and likely surgical repair.  He currently endorses pain in the right ankle but denies fever, chest pain, shortness of breath, abdominal pain, nausea, vomiting or other systemic symptoms.   Past Medical History 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[facility](HCC)    HSV-1 (herpes simplex virus 1) infection    HSV-2 (herpes simplex virus 2) infection    Left atrial enlargement    MRSA (methicillin resistant staph aureus) culture positive    Nicotine dependence    Polysubstance abuse (HCC)    Sepsis (HCC)    Patient Active Problem List   Diagnosis Date Noted   Cellulitis of right foot 03/15/2022   Crush injury to foot, right, initial encounter 03/15/2022   Hypokalemia 03/15/2022   PICC (peripherally inserted central catheter) in place 12/30/2021   Medication monitoring encounter 12/30/2021   Hepatitis C antibody positive in blood 12/15/2021   Wrist joint infection (HCC) 07/14/2021   Infection of right wrist  (HCC) 07/12/2021   Angina at rest [facility](HCC) 06/23/2021   HSV-1 (herpes simplex virus 1) infection 06/23/2021   Left atrial enlargement 06/23/2021   MRSA (methicillin resistant staph aureus) culture positive 06/23/2021   Polysubstance abuse (HCC) 06/23/2021   Home Medication(s) Prior to Admission medications   Medication Sig Start Date End Date Taking? Authorizing Provider  gabapentin (NEURONTIN) 300 MG capsule Take 1 capsule (300 mg total) by mouth at bedtime. 02/22/22  Yes Louann SjogrenSikora, Rebecca, DPM  gabapentin (NEURONTIN) 600 MG tablet Take 1 tablet (600 mg total) by mouth 3 (three) times daily. Patient taking differently: Take 600 mg by mouth 3 (three) times daily as needed (pain). 12/01/21  Yes Stover, Titorya, DPM  ibuprofen (ADVIL) 200 MG tablet Take 200 mg by mouth every 6 (six) hours as needed for moderate pain.   Yes [provider]  ondansetron (ZOFRAN) 4 MG tablet Take 1 tablet (4 mg total) by mouth every 8 (eight) hours as needed for nausea or vomiting. 01/19/22  Yes Louann SjogrenSikora, Rebecca, DPM  linezolid (ZYVOX) 600 MG tablet Take 1 tablet (600 mg total) by mouth 2 (two) times daily. Patient not taking: Reported on 03/15/2022 12/30/21   Gardiner Barefootomer, Robert W, MD  nebivolol (BYSTOLIC) 5 MG tablet Take 1 tablet (5 mg total) by mouth daily. Patient not taking: Reported on 03/15/2022 07/02/21   Baldo DaubMunley, Brian J, MD  Past Surgical History Past Surgical History:  Procedure Laterality Date   TOE AMPUTATION     Big Toe   Family History Family History  Problem Relation Age of Onset   Heart attack Father    Hypertension Father    Diabetes Father    Melanoma Father    Sleep apnea Father    Hypertension Maternal Grandmother    Atrial fibrillation Maternal Grandmother    Hypertension Maternal Grandfather    Pancreatic cancer Paternal Grandmother     Social History Social  History   Tobacco Use   Smoking status: Former    Packs/day: 1.50    Types: Cigarettes    Quit date: 11/2021    Years since quitting: 0.2   Smokeless tobacco: Current    Types: Chew  Substance Use Topics   Alcohol use: Yes    Comment: occasionally   Drug use: Never   Allergies Codeine, Other, and Vancomycin  Review of Systems Review of Systems  Musculoskeletal:  Positive for arthralgias.  Skin:  Positive for wound.    Physical Exam Vital Signs  I have reviewed the triage vital signs BP (!) 146/88 (BP Location: Right Arm)   Pulse 91   Temp 98.2 F (36.8 C)   Resp 18   Ht 6\' 1"  (1.854 m)   Wt 88.9 kg   SpO2 97%   BMI 25.86 kg/m   Physical Exam Constitutional:      General: He is not in acute distress.    Appearance: Normal appearance.  HENT:     Head: Normocephalic and atraumatic.     Nose: No congestion or rhinorrhea.  Eyes:     General:        Right eye: No discharge.        Left eye: No discharge.     Extraocular Movements: Extraocular movements intact.     Pupils: Pupils are equal, round, and reactive to light.  Cardiovascular:     Rate and Rhythm: Normal rate and regular rhythm.     Heart sounds: No murmur heard. Pulmonary:     Effort: No respiratory distress.     Breath sounds: No wheezing or rales.  Abdominal:     General: There is no distension.     Tenderness: There is no abdominal tenderness.  Musculoskeletal:        General: Swelling, tenderness and signs of injury present. Normal range of motion.     Cervical back: Normal range of motion.  Skin:    General: Skin is warm and dry.  Neurological:     General: No focal deficit present.     Mental Status: He is alert.     ED Results and Treatments Labs (all labs ordered are listed, but only abnormal results are displayed) Labs Reviewed  COMPREHENSIVE METABOLIC PANEL - Abnormal; Notable for the following components:      Result Value   Potassium 3.2 (*)    Calcium 8.7 (*)    AST 43  (*)    ALT 49 (*)    All other components within normal limits  CBC WITH DIFFERENTIAL/PLATELET - Abnormal; Notable for the following components:   HCT 37.9 (*)    All other components within normal limits  URINALYSIS, ROUTINE W REFLEX MICROSCOPIC - Abnormal; Notable for the following components:   Ketones, ur 5 (*)    All other components within normal limits  CULTURE, BLOOD (ROUTINE X 2)  CULTURE, BLOOD (ROUTINE X 2)  LACTIC ACID, PLASMA  PROTIME-INR  APTT  SEDIMENTATION RATE  C-REACTIVE PROTEIN  CK  HIV ANTIBODY (ROUTINE TESTING W REFLEX)  COMPREHENSIVE METABOLIC PANEL  MAGNESIUM  CBC WITH DIFFERENTIAL/PLATELET                                                                                                                          Radiology MR FOOT RIGHT WO CONTRAST  Result Date: 03/15/2022 CLINICAL DATA:  Right foot amputation stump infection. EXAM: MRI OF THE RIGHT FOREFOOT WITHOUT CONTRAST TECHNIQUE: Multiplanar, multisequence MR imaging of the right forefoot was performed. No intravenous contrast was administered. COMPARISON:  Right foot x-rays from same day. CT right foot dated March 13, 2022. FINDINGS: Bones/Joint/Cartilage Prior transmetatarsal amputation. Diffuse marrow edema involving the majority of the first through fifth metatarsals, sparing the bases. Corresponding T1 marrow signal is mildly decreased. No fracture or dislocation. Mild talonavicular and calcaneocuboid osteoarthritis. No joint effusion. Ligaments Lisfranc ligament is intact. Muscles and Tendons Postsurgical changes of the flexor and extensor tendons. No tenosynovitis. Increased T2 signal within the intrinsic muscles of the forefoot, nonspecific, but likely related to diabetic muscle changes. Soft tissue First, second, and third intermetatarsal fluid collections which possibly communicate, suspicious for abscess (series 9, image 12). Small fluid collection at the plantar base of the fifth metatarsal amputation  stump. Diffuse soft tissue swelling. IMPRESSION: 1. Prior transmetatarsal amputation. Diffuse marrow edema involving the majority of the first through fifth metatarsals, sparing the bases, consistent with osteomyelitis. 2. First, second, and third intermetatarsal fluid collections which possibly communicate, suspicious for large abscess. Small abscess at the plantar base of the fifth metatarsal amputation stump. Electronically Signed   By: Obie Dredge M.D.   On: 03/15/2022 21:20   DG Foot Complete Right  Result Date: 03/15/2022 Please see detailed radiograph report in office note.   Pertinent labs & imaging results that were available during my care of the patient were reviewed by me and considered in my medical decision making (see MDM for details).  Medications Ordered in ED Medications  gabapentin (NEURONTIN) capsule 300 mg (300 mg Oral Given 03/15/22 2258)  acetaminophen (TYLENOL) tablet 650 mg (has no administration in time range)    Or  acetaminophen (TYLENOL) suppository 650 mg (has no administration in time range)  polyethylene glycol (MIRALAX / GLYCOLAX) packet 17 g (has no administration in time range)  ondansetron (ZOFRAN) tablet 4 mg (has no administration in time range)    Or  ondansetron (ZOFRAN) injection 4 mg (has no administration in time range)  lactated ringers infusion ( Intravenous New Bag/Given 03/15/22 2250)  linezolid (ZYVOX) IVPB 600 mg (600 mg Intravenous New Bag/Given 03/15/22 2256)  ketorolac (TORADOL) 15 MG/ML injection 15 mg (has no administration in time range)  ketorolac (TORADOL) 15 MG/ML injection 15 mg (15 mg Intravenous Given 03/15/22 1918)  LORazepam (ATIVAN) injection 1 mg (1 mg Intravenous Given 03/15/22 1920)  ceFEPIme (MAXIPIME) 2 g in sodium chloride 0.9 % 100 mL IVPB (0 g Intravenous Stopped 03/15/22 2128)  potassium chloride SA (KLOR-CON M) CR tablet 40 mEq (40 mEq Oral Given 03/15/22 2257)                                                                                                                                      Procedures Procedures  (including critical care time)  Medical Decision Making / ED Course   This patient presents to the ED for concern of foot infection, this involves an extensive number of treatment options, and is a complaint that carries with it a high risk of complications and morbidity.  The differential diagnosis includes osteomyelitis, abscess, retained foreign body, fracture  MDM: Patient seen in the emergency department for evaluation of foot injury.  Physical exam tenderness and swelling up to the point of his transmetatarsal amputation and extending into the ankle with no appreciable crepitus.  Laboratory evaluation largely unremarkable outside of some mild hypokalemia to 3.2.  Broad-spectrum antibiotics initiated due to previous imaging showing osteomyelitis and abscess and an MRI of his foot was obtained.  Initially I received a call from the reading radiologist stating the patient did NOT have osteomyelitis or abscess, but the official read shows diffuse osteomyelitis and an abscess and thus antibiotics were continued.  I spoke with the podiatrist on-call who recommends medical admission and likely surgery tomorrow.  Patient then admitted.   Additional history obtained:  -External records from outside source obtained and reviewed including: Chart review including previous notes, labs, imaging, consultation notes   Lab Tests: -I ordered, reviewed, and interpreted labs.   The pertinent results include:   Labs Reviewed  COMPREHENSIVE METABOLIC PANEL - Abnormal; Notable for the following components:      Result Value   Potassium 3.2 (*)    Calcium 8.7 (*)    AST 43 (*)    ALT 49 (*)    All other components within normal limits  CBC WITH DIFFERENTIAL/PLATELET - Abnormal; Notable for the following components:   HCT 37.9 (*)    All other components within normal limits  URINALYSIS, ROUTINE W REFLEX  MICROSCOPIC - Abnormal; Notable for the following components:   Ketones, ur 5 (*)    All other components within normal limits  CULTURE, BLOOD (ROUTINE X 2)  CULTURE, BLOOD (ROUTINE X 2)  LACTIC ACID, PLASMA  PROTIME-INR  APTT  SEDIMENTATION RATE  C-REACTIVE PROTEIN  CK  HIV ANTIBODY (ROUTINE TESTING W REFLEX)  COMPREHENSIVE METABOLIC PANEL  MAGNESIUM  CBC WITH DIFFERENTIAL/PLATELET      EKG   EKG Interpretation  Date/Time:  Monday March 15 2022 18:39:12 EDT Ventricular Rate:  98 PR Interval:  143 QRS Duration: 89 QT Interval:  344 QTC Calculation: 440 R Axis:   79 Text Interpretation: Sinus rhythm Borderline repolarization abnormality Confirmed by Yilin Weedon ([id number]) on 03/16/2022 1:57:09 AM         Imaging Studies ordered: I ordered imaging studies including MRI right foot  I independently visualized and interpreted imaging. I agree with the radiologist interpretation   Medicines ordered and prescription drug management: Meds ordered this encounter  Medications   ketorolac (TORADOL) 15 MG/ML injection 15 mg   LORazepam (ATIVAN) injection 1 mg   DISCONTD: vancomycin (VANCOREADY) IVPB 2000 mg/400 mL    Order Specific Question:   Indication:    Answer:   Osteomyelitis   ceFEPIme (MAXIPIME) 2 g in sodium chloride 0.9 % 100 mL IVPB    Order Specific Question:   Antibiotic Indication:    Answer:   Osteomyelitis   gabapentin (NEURONTIN) capsule 300 mg   OR Linked Order Group    acetaminophen (TYLENOL) tablet 650 mg    acetaminophen (TYLENOL) suppository 650 mg   polyethylene glycol (MIRALAX / GLYCOLAX) packet 17 g   OR Linked Order Group    ondansetron (ZOFRAN) tablet 4 mg    ondansetron (ZOFRAN) injection 4 mg   potassium chloride SA (KLOR-CON M) CR tablet 40 mEq   lactated ringers infusion   linezolid (ZYVOX) IVPB 600 mg    Order Specific Question:   Antibiotic Indication:    Answer:   Cellulitis   ketorolac (TORADOL) 15 MG/ML injection 15 mg    -I  have reviewed the patients home medicines and have made adjustments as needed  Critical interventions none  Consultations Obtained: I requested consultation with the podiatrist on-call,  and discussed lab and imaging findings as well as pertinent plan - they recommend: Antibiotics and admission for likely surgery tomorrow   Cardiac Monitoring: The patient was maintained on a cardiac monitor.  I personally viewed and interpreted the cardiac monitored which showed an underlying rhythm of: NSR  Social Determinants of Health:  Factors impacting patients care include: Previous history of polysubstance use   Reevaluation: After the interventions noted above, I reevaluated the patient and found that they have :improved  Co morbidities that complicate the patient evaluation  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[facility])    HSV-1 (herpes simplex virus 1) infection    HSV-2 (herpes simplex virus 2) infection    Left atrial enlargement    MRSA (methicillin resistant staph aureus) culture positive    Nicotine dependence    Polysubstance abuse (HCC)    Sepsis (HCC)       Dispostion: I considered admission for this patient, and due to osteomyelitis with an abscess, patient require hospital admission     Final Clinical Impression(s) / ED Diagnoses Final diagnoses:  Wound infection     @PCDICTATION @    , MD 03/16/22 [phone]

## 2022-03-15 NOTE — Progress Notes (Signed)
PHARMACY -  BRIEF ANTIBIOTIC NOTE   Pharmacy has received consult(s) for vancomycin and cefepime from an ED provider.  The patient's profile has been reviewed for ht/wt/allergies/indication/available labs.    One time order(s) placed for vancomycin 2 g + cefepime 2 g  Further antibiotics/pharmacy consults should be ordered by admitting physician if indicated.                       Thank you,  Pricilla Riffle, PharmD, BCPS Clinical Pharmacist 03/15/2022 8:22 PM

## 2022-03-15 NOTE — Assessment & Plan Note (Signed)
   Patient suffered a crush injury to the right foot in an occupational accident with his skid steer on 8/12  Initial x-ray imaging showed no evidence of fracture  Additional clinical concern for abscess formation or osteomyelitis.  EDP has ordered MRI of the foot which is pending.  Upon evaluation in the [facility] emergency department there was initial clinical concern for compartment syndrome however patient was evaluated at the bedside by orthopedic surgery (Dr. Loralie Champagne) who felt there was no clinical evidence of this.  Patient is currently neurovascularly intact and I would agree upon my evaluation.  Obtaining CK  Regular neurovascular evaluations  As needed analgesics for associated substantial pain, patient is okay with intravenous Toradol considering history of polysubstance abuse  Appreciate podiatry consultation that will occur in the morning with potential operative intervention

## 2022-03-15 NOTE — ED Triage Notes (Signed)
Pt reports having his toes on his right foot surgically removed and since then has had a bone infection. Pt reports he waited in [place] hospital all weekend and ended up leaving AMA. Pt reports coming here today to get admitted.

## 2022-03-15 NOTE — Assessment & Plan Note (Signed)
·   Replacing with potassium chloride °· Evaluating for concurrent hypomagnesemia  °· Monitoring potassium levels with serial chemistries. ° °

## 2022-03-15 NOTE — ED Notes (Signed)
Patient transported to MRI 

## 2022-03-15 NOTE — ED Notes (Signed)
Urinal at bedside.  

## 2022-03-15 NOTE — ED Provider Triage Note (Signed)
Emergency Medicine Provider Triage Evaluation Note  George Shelton , a 41 y.o. male  was evaluated in triage.  Pt complains of infected amputation sites of the right foot.  Patient had 4 toes amputated in June.  He has been doing well up until Saturday when he hit his right foot with a skid steer.  He states he immediately felt severe pain and went to [facility] emergency department.  States that he had significant work-up done there and was told that he had possible osteo myelitis and an abscess in his right foot.  He states that he reportedly had been proved for admission at [facility], however they do not have a bed available.  Eventually he became frustrated and left AMA.  He saw his podiatrist today, Dr. Ralene Cork who noted dehiscence and ecchymosis around the ankle and the foot with surrounding erythema and edema.  Foot was rewrapped and he was sent to the emergency department for work-up and likely IV antibiotics and admission. Review of Systems  Positive:  Negative:   Physical Exam  BP (!) 162/95   Pulse (!) 109   Temp 98.7 F (37.1 C) (Oral)   Resp 20   Ht 6\' 1"  (1.854 m)   Wt 88.9 kg   SpO2 98%   BMI 25.86 kg/m  Gen:   Awake, no distress   Resp:  Normal effort  MSK:   Moves extremities without difficulty  Other:  Foot wrapped by podiatry  Medical Decision Making  Medically screening exam initiated at 5:54 PM.  Appropriate orders placed.  George Shelton was informed that the remainder of the evaluation will be completed by another provider, this initial triage assessment does not replace that evaluation, and the importance of remaining in the ED until their evaluation is complete.     George Shelton, George Shelton 03/15/22 1757

## 2022-03-15 NOTE — Assessment & Plan Note (Signed)
   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

## 2022-03-15 NOTE — ED Notes (Signed)
Pt has returned from MRI. 
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
## 2022-03-15 NOTE — ED Notes (Signed)
Family at bedside. 

## 2022-03-16 ENCOUNTER — Encounter (HOSPITAL_COMMUNITY): Payer: Self-pay | Admitting: Internal Medicine

## 2022-03-16 ENCOUNTER — Observation Stay (HOSPITAL_COMMUNITY): Payer: BC Managed Care – PPO

## 2022-03-16 ENCOUNTER — Inpatient Hospital Stay (HOSPITAL_COMMUNITY): Payer: BC Managed Care – PPO | Admitting: Certified Registered Nurse Anesthetist

## 2022-03-16 ENCOUNTER — Encounter (HOSPITAL_COMMUNITY)
Admission: EM | Disposition: A | Discharge status: Home / Self Care | Payer: Self-pay | Source: Home / Self Care | Attending: Internal Medicine

## 2022-03-16 DIAGNOSIS — Y92009 Unspecified place in unspecified non-institutional (private) residence as the place of occurrence of the external cause: Secondary | ICD-10-CM | POA: Diagnosis not present

## 2022-03-16 DIAGNOSIS — Z79899 Other long term (current) drug therapy: Secondary | ICD-10-CM | POA: Diagnosis not present

## 2022-03-16 DIAGNOSIS — G4733 Obstructive sleep apnea (adult) (pediatric): Secondary | ICD-10-CM | POA: Diagnosis present

## 2022-03-16 DIAGNOSIS — Z881 Allergy status to other antibiotic agents status: Secondary | ICD-10-CM | POA: Diagnosis not present

## 2022-03-16 DIAGNOSIS — Z91128 Patient's intentional underdosing of medication regimen for other reason: Secondary | ICD-10-CM | POA: Diagnosis not present

## 2022-03-16 DIAGNOSIS — W230XXA Caught, crushed, jammed, or pinched between moving objects, initial encounter: Secondary | ICD-10-CM | POA: Diagnosis present

## 2022-03-16 DIAGNOSIS — E876 Hypokalemia: Secondary | ICD-10-CM | POA: Diagnosis present

## 2022-03-16 DIAGNOSIS — L03115 Cellulitis of right lower limb: Secondary | ICD-10-CM | POA: Diagnosis not present

## 2022-03-16 DIAGNOSIS — Z808 Family history of malignant neoplasm of other organs or systems: Secondary | ICD-10-CM | POA: Diagnosis not present

## 2022-03-16 DIAGNOSIS — Z91199 Patient's noncompliance with other medical treatment and regimen due to unspecified reason: Secondary | ICD-10-CM | POA: Diagnosis not present

## 2022-03-16 DIAGNOSIS — Z800 Family history of malignant neoplasm of digestive organs: Secondary | ICD-10-CM | POA: Diagnosis not present

## 2022-03-16 DIAGNOSIS — L02611 Cutaneous abscess of right foot: Secondary | ICD-10-CM | POA: Diagnosis not present

## 2022-03-16 DIAGNOSIS — Y990 Civilian activity done for income or pay: Secondary | ICD-10-CM | POA: Diagnosis not present

## 2022-03-16 DIAGNOSIS — M86171 Other acute osteomyelitis, right ankle and foot: Secondary | ICD-10-CM | POA: Diagnosis not present

## 2022-03-16 DIAGNOSIS — Z833 Family history of diabetes mellitus: Secondary | ICD-10-CM | POA: Diagnosis not present

## 2022-03-16 DIAGNOSIS — Z89431 Acquired absence of right foot: Secondary | ICD-10-CM | POA: Diagnosis not present

## 2022-03-16 DIAGNOSIS — T148XXA Other injury of unspecified body region, initial encounter: Secondary | ICD-10-CM | POA: Diagnosis present

## 2022-03-16 DIAGNOSIS — T447X6A Underdosing of beta-adrenoreceptor antagonists, initial encounter: Secondary | ICD-10-CM | POA: Diagnosis present

## 2022-03-16 DIAGNOSIS — M7989 Other specified soft tissue disorders: Secondary | ICD-10-CM | POA: Diagnosis not present

## 2022-03-16 DIAGNOSIS — Z8614 Personal history of Methicillin resistant Staphylococcus aureus infection: Secondary | ICD-10-CM | POA: Diagnosis not present

## 2022-03-16 DIAGNOSIS — M86671 Other chronic osteomyelitis, right ankle and foot: Secondary | ICD-10-CM | POA: Diagnosis not present

## 2022-03-16 DIAGNOSIS — I10 Essential (primary) hypertension: Secondary | ICD-10-CM | POA: Diagnosis present

## 2022-03-16 DIAGNOSIS — S9781XA Crushing injury of right foot, initial encounter: Secondary | ICD-10-CM | POA: Diagnosis present

## 2022-03-16 DIAGNOSIS — Z885 Allergy status to narcotic agent status: Secondary | ICD-10-CM | POA: Diagnosis not present

## 2022-03-16 DIAGNOSIS — F17220 Nicotine dependence, chewing tobacco, uncomplicated: Secondary | ICD-10-CM | POA: Diagnosis present

## 2022-03-16 DIAGNOSIS — Z8249 Family history of ischemic heart disease and other diseases of the circulatory system: Secondary | ICD-10-CM | POA: Diagnosis not present

## 2022-03-16 HISTORY — PX: TRANSMETATARSAL AMPUTATION: SHX6197

## 2022-03-16 LAB — CBC WITH DIFFERENTIAL/PLATELET
Abs Immature Granulocytes: 0.05 10*3/uL (ref 0.00–0.07)
Basophils Absolute: 0.1 10*3/uL (ref 0.0–0.1)
Basophils Relative: 1 %
Eosinophils Absolute: 0.3 10*3/uL (ref 0.0–0.5)
Eosinophils Relative: 8 %
HCT: 39.8 % (ref 39.0–52.0)
Hemoglobin: 13.5 g/dL (ref 13.0–17.0)
Immature Granulocytes: 1 %
Lymphocytes Relative: 22 %
Lymphs Abs: 1 10*3/uL (ref 0.7–4.0)
MCH: 30.1 pg (ref 26.0–34.0)
MCHC: 33.9 g/dL (ref 30.0–36.0)
MCV: 88.8 fL (ref 80.0–100.0)
Monocytes Absolute: 0.5 10*3/uL (ref 0.1–1.0)
Monocytes Relative: 11 %
Neutro Abs: 2.5 10*3/uL (ref 1.7–7.7)
Neutrophils Relative %: 57 %
Platelets: 211 10*3/uL (ref 150–400)
RBC: 4.48 MIL/uL (ref 4.22–5.81)
RDW: 13.4 % (ref 11.5–15.5)
WBC: 4.4 10*3/uL (ref 4.0–10.5)
nRBC: 0 % (ref 0.0–0.2)

## 2022-03-16 LAB — COMPREHENSIVE METABOLIC PANEL
ALT: 43 U/L (ref 0–44)
AST: 35 U/L (ref 15–41)
Albumin: 3.4 g/dL — ABNORMAL LOW (ref 3.5–5.0)
Alkaline Phosphatase: 57 U/L (ref 38–126)
Anion gap: 8 (ref 5–15)
BUN: 8 mg/dL (ref 6–20)
CO2: 25 mmol/L (ref 22–32)
Calcium: 8.6 mg/dL — ABNORMAL LOW (ref 8.9–10.3)
Chloride: 106 mmol/L (ref 98–111)
Creatinine, Ser: 1 mg/dL (ref 0.61–1.24)
GFR, Estimated: >60 mL/min (ref >60)
Glucose, Bld: 81 mg/dL (ref 70–99)
Potassium: 3.5 mmol/L (ref 3.5–5.1)
Sodium: 139 mmol/L (ref 135–145)
Total Bilirubin: 1 mg/dL (ref 0.3–1.2)
Total Protein: 6 g/dL — ABNORMAL LOW (ref 6.5–8.1)

## 2022-03-16 LAB — CK: Total CK: 625 U/L — ABNORMAL HIGH (ref 49–397)

## 2022-03-16 LAB — MAGNESIUM: Magnesium: 1.8 mg/dL (ref 1.7–2.4)

## 2022-03-16 LAB — HIV ANTIBODY (ROUTINE TESTING W REFLEX): HIV Screen 4th Generation wRfx: NONREACTIVE

## 2022-03-16 SURGERY — AMPUTATION, FOOT, TRANSMETATARSAL
Anesthesia: Regional | Site: Toe | Laterality: Right

## 2022-03-16 MED ORDER — PROPOFOL 10 MG/ML IV BOLUS
INTRAVENOUS | Status: AC
  Filled 2022-03-16: qty 20

## 2022-03-16 MED ORDER — CLONIDINE HCL (ANALGESIA) 100 MCG/ML EP SOLN
EPIDURAL | Status: DC | PRN
  Administered 2022-03-16: 80 ug

## 2022-03-16 MED ORDER — MEPERIDINE HCL 50 MG/ML IJ SOLN: 6.2500 mg | INTRAMUSCULAR | Status: DC | PRN

## 2022-03-16 MED ORDER — LACTATED RINGERS IV SOLN: INTRAVENOUS | Status: DC

## 2022-03-16 MED ORDER — IBUPROFEN 400 MG PO TABS
400.0000 mg | ORAL_TABLET | Freq: Four times a day (QID) | ORAL | Status: DC | PRN
  Administered 2022-03-16 – 2022-03-18 (×3): 400 mg via ORAL
  Filled 2022-03-16 (×3): qty 1

## 2022-03-16 MED ORDER — ONDANSETRON HCL 4 MG/2ML IJ SOLN
INTRAMUSCULAR | Status: AC
  Filled 2022-03-16: qty 2

## 2022-03-16 MED ORDER — PROPOFOL 500 MG/50ML IV EMUL
INTRAVENOUS | Status: DC | PRN
  Administered 2022-03-16: 100 ug/kg/min via INTRAVENOUS

## 2022-03-16 MED ORDER — DEXAMETHASONE SODIUM PHOSPHATE 10 MG/ML IJ SOLN
INTRAMUSCULAR | Status: AC
  Filled 2022-03-16: qty 1

## 2022-03-16 MED ORDER — CYCLOBENZAPRINE HCL 5 MG PO TABS
5.0000 mg | ORAL_TABLET | Freq: Three times a day (TID) | ORAL | Status: DC | PRN
  Administered 2022-03-16 – 2022-03-17 (×4): 5 mg via ORAL
  Filled 2022-03-16 (×4): qty 1

## 2022-03-16 MED ORDER — GABAPENTIN 300 MG PO CAPS
600.0000 mg | ORAL_CAPSULE | Freq: Three times a day (TID) | ORAL | Status: DC | PRN
  Administered 2022-03-16 – 2022-03-18 (×4): 600 mg via ORAL
  Filled 2022-03-16 (×4): qty 2

## 2022-03-16 MED ORDER — PROPOFOL 10 MG/ML IV BOLUS
INTRAVENOUS | Status: DC | PRN
  Administered 2022-03-16: 50 mg via INTRAVENOUS

## 2022-03-16 MED ORDER — DEXAMETHASONE SODIUM PHOSPHATE 4 MG/ML IJ SOLN
INTRAMUSCULAR | Status: DC | PRN
  Administered 2022-03-16: 5 mg via PERINEURAL

## 2022-03-16 MED ORDER — SODIUM CHLORIDE 0.9 % IR SOLN
Status: DC | PRN
  Administered 2022-03-16: 1000 mL

## 2022-03-16 MED ORDER — HYDROMORPHONE HCL 1 MG/ML IJ SOLN: 0.2500 mg | INTRAMUSCULAR | Status: DC | PRN

## 2022-03-16 MED ORDER — TRAMADOL HCL 50 MG PO TABS
50.0000 mg | ORAL_TABLET | Freq: Four times a day (QID) | ORAL | Status: DC | PRN
  Administered 2022-03-16 – 2022-03-17 (×2): 50 mg via ORAL
  Filled 2022-03-16 (×2): qty 1

## 2022-03-16 MED ORDER — ONDANSETRON HCL 4 MG/2ML IJ SOLN
INTRAMUSCULAR | Status: DC | PRN
  Administered 2022-03-16: 4 mg via INTRAVENOUS

## 2022-03-16 MED ORDER — BUPIVACAINE HCL (PF) 0.5 % IJ SOLN
INTRAMUSCULAR | Status: AC
  Filled 2022-03-16: qty 30

## 2022-03-16 MED ORDER — DEXAMETHASONE SODIUM PHOSPHATE 10 MG/ML IJ SOLN
INTRAMUSCULAR | Status: DC | PRN
  Administered 2022-03-16: 8 mg via INTRAVENOUS

## 2022-03-16 MED ORDER — PROMETHAZINE HCL 25 MG/ML IJ SOLN: 6.2500 mg | INTRAMUSCULAR | Status: DC | PRN

## 2022-03-16 MED ORDER — ROPIVACAINE HCL 7.5 MG/ML IJ SOLN
INTRAMUSCULAR | Status: DC | PRN
  Administered 2022-03-16: 30 mL via PERINEURAL

## 2022-03-16 SURGICAL SUPPLY — 57 items
ABDOMINAL PAD ABD ×1 IMPLANT
BAG COUNTER SPONGE SURGICOUNT (BAG) IMPLANT
BLADE AVERAGE 25X9 (BLADE) IMPLANT
BLADE OSCILLATING/SAGITTAL (BLADE)
BLADE SURG 15 STRL LF DISP TIS (BLADE) ×2 IMPLANT
BLADE SURG 15 STRL SS (BLADE) ×1
BLADE SW THK.38XMED LNG THN (BLADE) ×1 IMPLANT
BNDG ELASTIC 4X5.8 VLCR STR LF (GAUZE/BANDAGES/DRESSINGS) ×1 IMPLANT
BNDG ELASTIC 6X5.8 VLCR STR LF (GAUZE/BANDAGES/DRESSINGS) ×2 IMPLANT
BNDG ESMARK 4X9 LF (GAUZE/BANDAGES/DRESSINGS) ×2 IMPLANT
BNDG GAUZE DERMACEA FLUFF (GAUZE/BANDAGES/DRESSINGS)
BNDG GAUZE DERMACEA FLUFF 4 (GAUZE/BANDAGES/DRESSINGS) ×1 IMPLANT
BNDG GAUZE ELAST 4 BULKY (GAUZE/BANDAGES/DRESSINGS) ×1 IMPLANT
CNTNR URN SCR LID CUP LEK RST (MISCELLANEOUS) IMPLANT
CONT SPEC 4OZ STRL OR WHT (MISCELLANEOUS) ×1
COVER BACK TABLE 60X90IN (DRAPES) ×2 IMPLANT
CUFF TOURN SGL QUICK 18X4 (TOURNIQUET CUFF) IMPLANT
DRAPE EXTREMITY T 121X128X90 (DISPOSABLE) ×2 IMPLANT
DRAPE IMP U-DRAPE 54X76 (DRAPES) ×2 IMPLANT
DRSG EMULSION OIL 3X3 NADH (GAUZE/BANDAGES/DRESSINGS) ×1 IMPLANT
DURAPREP 26ML APPLICATOR (WOUND CARE) ×2 IMPLANT
ELECT REM PT RETURN 15FT ADLT (MISCELLANEOUS) ×2 IMPLANT
GAUZE 4X4 16PLY ~~LOC~~+RFID DBL (SPONGE) IMPLANT
GAUZE SPONGE 4X4 12PLY STRL (GAUZE/BANDAGES/DRESSINGS) ×2 IMPLANT
GAUZE XEROFORM 5X9 LF (GAUZE/BANDAGES/DRESSINGS) ×1 IMPLANT
GLOVE BIO SURGEON STRL SZ7.5 (GLOVE) ×4 IMPLANT
GOWN STRL REUS W/ TWL LRG LVL3 (GOWN DISPOSABLE) ×1 IMPLANT
GOWN STRL REUS W/ TWL XL LVL3 (GOWN DISPOSABLE) ×1 IMPLANT
GOWN STRL REUS W/TWL LRG LVL3 (GOWN DISPOSABLE) ×1
GOWN STRL REUS W/TWL XL LVL3 (GOWN DISPOSABLE) ×1
KIT BASIN OR (CUSTOM PROCEDURE TRAY) ×2 IMPLANT
KIT TURNOVER KIT A (KITS) IMPLANT
NDL HYPO 25X1 1.5 SAFETY (NEEDLE) ×1 IMPLANT
NDL SAFETY ECLIPSE 18X1.5 (NEEDLE) IMPLANT
NEEDLE HYPO 18GX1.5 SHARP (NEEDLE)
NEEDLE HYPO 25X1 1.5 SAFETY (NEEDLE) ×2 IMPLANT
NS IRRIG 1000ML POUR BTL (IV SOLUTION) IMPLANT
PACK BASIC VI WITH GOWN DISP (CUSTOM PROCEDURE TRAY) ×1 IMPLANT
PADDING CAST ABS 4INX4YD NS (CAST SUPPLIES)
PADDING CAST ABS COTTON 4X4 ST (CAST SUPPLIES) ×1 IMPLANT
PENCIL SMOKE EVACUATOR (MISCELLANEOUS) ×2 IMPLANT
SPONGE T-LAP 18X18 ~~LOC~~+RFID (SPONGE) ×1 IMPLANT
STAPLER VISISTAT (STAPLE) ×2 IMPLANT
STOCKINETTE 6  STRL (DRAPES) ×1
STOCKINETTE 6 STRL (DRAPES) ×1 IMPLANT
SUT ETHILON 3 0 FSL (SUTURE) ×1 IMPLANT
SUT MNCRL AB 3-0 PS2 18 (SUTURE) ×1 IMPLANT
SUT PROLENE 3 0 PS 2 (SUTURE) ×2 IMPLANT
SUT SILK 2 0 (SUTURE)
SUT SILK 2-0 18XBRD TIE 12 (SUTURE) ×1 IMPLANT
SWAB COLLECTION DEVICE MRSA (MISCELLANEOUS) ×1 IMPLANT
SWAB CULTURE ESWAB REG 1ML (MISCELLANEOUS) ×1 IMPLANT
SYR 10ML LL (SYRINGE) IMPLANT
SYR BULB EAR ULCER 3OZ GRN STR (SYRINGE) ×1 IMPLANT
SYR BULB IRRIG 60ML STRL (SYRINGE) ×1 IMPLANT
UNDERPAD 30X36 HEAVY ABSORB (UNDERPADS AND DIAPERS) ×2 IMPLANT
YANKAUER SUCT BULB TIP 10FT TU (MISCELLANEOUS) ×1 IMPLANT

## 2022-03-16 NOTE — Transfer of Care (Signed)
Immediate Anesthesia Transfer of Care Note  Patient: George Shelton  Procedure(s) Performed: TRANSMETATARSAL AMPUTATION/I&D RIGHT FOOT/BONE BIOPSY (Right: Toe)  Patient Location: PACU  Anesthesia Type:MAC  Level of Consciousness: awake, alert  and patient cooperative  Airway & Oxygen Therapy: Patient Spontanous Breathing and Patient connected to face mask oxygen  Post-op Assessment: Report given to RN, Post -op Vital signs reviewed and stable and Patient moving all extremities X 4  Post vital signs: Reviewed and stable  Last Vitals:  Vitals Value Taken Time  BP 117/86 03/16/22 1815  Temp    Pulse 81 03/16/22 1815  Resp 9 03/16/22 1815  SpO2 100 % 03/16/22 1815  Vitals shown include unvalidated device data.  Last Pain:  Vitals:   03/16/22 1710  TempSrc:   PainSc: 0-No pain      Patients Stated Pain Goal: 4 (97/67/34 1937)  Complications: No notable events documented.
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
## 2022-03-16 NOTE — Interval H&P Note (Signed)
History and Physical Interval Note:  03/16/2022 5:15 PM  Terald Jump  has presented today for surgery, with the diagnosis of Abscess, osteomyelitis.  The various methods of treatment have been discussed with the patient and family. After consideration of risks, benefits and other options for treatment, the patient has consented to  Procedure(s) with comments: TRANSMETATARSAL AMPUTATION (Right) - patient would like a block as a surgical intervention.  The patient's history has been reviewed, patient examined, no change in status, stable for surgery.  I have reviewed the patient's chart and labs.  Questions were answered to the patient's satisfaction.    I discussed with the patient Incision and drainage with possible revision of the TMA. We will see what it looks like intraoperatively. If there is clinical evidence of infection will proceed as necessary but if no obvious signs of infection will likely do a washout, culture.    Vivi Barrack
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
## 2022-03-16 NOTE — Anesthesia Procedure Notes (Signed)
Anesthesia Regional Block: Popliteal block   Pre-Anesthetic Checklist: , timeout performed,  Correct Patient, Correct Site, Correct Laterality,  Correct Procedure, Correct Position, site marked,  Risks and benefits discussed,  Surgical consent,  Pre-op evaluation,  At surgeon's request and post-op pain management  Laterality: Lower and Right  Prep: chloraprep       Needles:  Injection technique: Single-shot  Needle Type: Stimiplex     Needle Length: 10cm  Needle Gauge: 21     Additional Needles:   Procedures:,,,, ultrasound used (permanent image in chart),,   Motor weakness within 5 minutes.  Narrative:  Start time: 03/16/2022 4:30 PM End time: 03/16/2022 4:50 PM Injection made incrementally with aspirations every 5 mL.  Performed by: Personally  Anesthesiologist: Lewie Loron, MD  Additional Notes: Nerve located and needle positioned with direct ultrasound guidance. Good perineural spread. Patient tolerated well.

## 2022-03-16 NOTE — Progress Notes (Signed)
Pt arrived to floor, alert and oriented x4. Pt was very hungry. Oriented patient of nursing care. Will monitor pt.

## 2022-03-16 NOTE — Progress Notes (Signed)
Received a call from the patient's nurse stating that he had questions about the surgery and he was concerned that signed the consent form.  Having second thoughts about the surgery was scheduled for later today.  I called the patient 12 PM to discuss this with him.  I again discussed the MRI findings.  He states that prior to the injury on Saturday his foot was "normal".  Afterwards may get swollen and red.  We discussed that even though this is an injury the MRI still shows infection and if we leave infection in the foot has a higher chance of losing the foot or leg.  We again discussed the surgery as well as alternatives, risks, complications.  He is considering his options.  I discussed with him later today as well but I do recommend surgical intervention.

## 2022-03-16 NOTE — Brief Op Note (Signed)
03/16/2022  6:39 PM  PATIENT:  George Shelton  41 y.o. male  PRE-OPERATIVE DIAGNOSIS:  Abscess, osteomyelitis  POST-OPERATIVE DIAGNOSIS:  Abscess, osteomyelitis  PROCEDURE:  Procedure(s) with comments: TRANSMETATARSAL AMPUTATION/I&D RIGHT FOOT/BONE BIOPSY (Right) - patient would like a block  SURGEON:  Surgeon(s) and Role:    * Ashunti Schofield, Lesia Sago, DPM - Primary  PHYSICIAN ASSISTANT:   ASSISTANTS: none   ANESTHESIA:   general  EBL:  60 mL   BLOOD ADMINISTERED:none  DRAINS: none   LOCAL MEDICATIONS USED:  NONE  SPECIMEN:  Source of Specimen:  wound culture, bone pathology  DISPOSITION OF SPECIMEN:  PATHOLOGY  COUNTS:  YES  TOURNIQUET:  * Missing tourniquet times found for documented tourniquets in log: [id number] *  DICTATION: .Dragon Dictation  PLAN OF CARE: Admit to inpatient   PATIENT DISPOSITION:  PACU - hemodynamically stable.   Delay start of Pharmacological VTE agent (>24hrs) due to surgical blood loss or risk of bleeding: no  Intraoperative findings: No abscess/purulence found. Bone appeared hard. Therefore I did not proceed with revision of the TMA but I took bone pathology and wound culture.

## 2022-03-16 NOTE — Anesthesia Preprocedure Evaluation (Addendum)
Anesthesia Evaluation  Patient identified by MRN, date of birth, ID band Patient awake    Reviewed: Allergy & Precautions, NPO status , Patient's Chart, lab work & pertinent test results  Airway Mallampati: II  TM Distance: >3 FB Neck ROM: Full    Dental  (+) Dental Advisory Given, Chipped   Pulmonary neg pulmonary ROS, former smoker,    Pulmonary exam normal breath sounds clear to auscultation       Cardiovascular hypertension, Pt. on home beta blockers + angina Normal cardiovascular exam Rhythm:Regular Rate:Normal  Stress/MPS 07/2021 .  Findings are consistent with no ischemia and no prior myocardial infarction. The study is low risk. .  No ST deviation was noted. .  Left ventricular function is normal. Nuclear stress EF: 59 %. The left ventricular ejection fraction is normal (55-65%). End diastolic cavity size is normal. .  Prior study not available for comparison. .  Diaphragmatic attenuation noted.   Echo 05/2021 Normal LVSF    Neuro/Psych negative neurological ROS     GI/Hepatic negative GI ROS, (+)     substance abuse  alcohol use, Hepatitis -, C  Endo/Other  negative endocrine ROS  Renal/GU negative Renal ROS     Musculoskeletal  (+) Arthritis ,   Abdominal   Peds  Hematology negative hematology ROS (+)   Anesthesia Other Findings   Reproductive/Obstetrics                            Anesthesia Physical Anesthesia Plan  ASA: 3  Anesthesia Plan: Regional   Post-op Pain Management: Gabapentin PO (pre-op)* and Regional block*   Induction:   PONV Risk Score and Plan: 1 and Ondansetron, Dexamethasone, Treatment may vary due to age or medical condition and Midazolam  Airway Management Planned:   Additional Equipment:   Intra-op Plan:   Post-operative Plan:   Informed Consent: I have reviewed the patients History and Physical, chart, labs and discussed the procedure  including the risks, benefits and alternatives for the proposed anesthesia with the patient or authorized representative who has indicated his/her understanding and acceptance.     Dental advisory given  Plan Discussed with: CRNA  Anesthesia Plan Comments:        Anesthesia Quick Evaluation

## 2022-03-16 NOTE — Progress Notes (Addendum)
On third floor unit to pick up Pt for transport to OR.  Pt walking out of room in bare feet and sat down quickly in stretcher.  Educated Pt on not walking bare-footed and need to wear gripper footies or shoes in hall and room; and how to protect right foot wound from further infection .  During preop assessment Pt states that he never started the beta blocker on his medication record. Pt tolerated preop block in short stay without fentanyl (which he refused).

## 2022-03-16 NOTE — H&P (View-Only) (Signed)
Received a call from the patient's nurse stating that he had questions about the surgery and he was concerned that signed the consent form.  Having second thoughts about the surgery was scheduled for later today.  I called the patient 12 PM to discuss this with him.  I again discussed the MRI findings.  He states that prior to the injury on Saturday his foot was "normal".  Afterwards may get swollen and red.  We discussed that even though this is an injury the MRI still shows infection and if we leave infection in the foot has a higher chance of losing the foot or leg.  We again discussed the surgery as well as alternatives, risks, complications.  He is considering his options.  I discussed with him later today as well but I do recommend surgical intervention.  

## 2022-03-17 ENCOUNTER — Encounter (HOSPITAL_COMMUNITY): Payer: Self-pay | Admitting: Podiatry

## 2022-03-17 ENCOUNTER — Other Ambulatory Visit: Payer: Self-pay

## 2022-03-17 DIAGNOSIS — L03115 Cellulitis of right lower limb: Secondary | ICD-10-CM | POA: Diagnosis not present

## 2022-03-17 LAB — CBC
HCT: 42.1 % (ref 39.0–52.0)
Hemoglobin: 14.4 g/dL (ref 13.0–17.0)
MCH: 30.2 pg (ref 26.0–34.0)
MCHC: 34.2 g/dL (ref 30.0–36.0)
MCV: 88.3 fL (ref 80.0–100.0)
Platelets: 235 10*3/uL (ref 150–400)
RBC: 4.77 MIL/uL (ref 4.22–5.81)
RDW: 12.9 % (ref 11.5–15.5)
WBC: 6.1 10*3/uL (ref 4.0–10.5)
nRBC: 0 % (ref 0.0–0.2)

## 2022-03-17 LAB — BASIC METABOLIC PANEL
Anion gap: 8 (ref 5–15)
BUN: 12 mg/dL (ref 6–20)
CO2: 26 mmol/L (ref 22–32)
Calcium: 9.1 mg/dL (ref 8.9–10.3)
Chloride: 104 mmol/L (ref 98–111)
Creatinine, Ser: 1.21 mg/dL (ref 0.61–1.24)
GFR, Estimated: >60 mL/min (ref >60)
Glucose, Bld: 282 mg/dL — ABNORMAL HIGH (ref 70–99)
Potassium: 4.3 mmol/L (ref 3.5–5.1)
Sodium: 138 mmol/L (ref 135–145)

## 2022-03-17 MED ORDER — ALBUTEROL SULFATE (2.5 MG/3ML) 0.083% IN NEBU
3.0000 mL | INHALATION_SOLUTION | Freq: Four times a day (QID) | RESPIRATORY_TRACT | Status: DC | PRN
  Administered 2022-03-17: 3 mL via RESPIRATORY_TRACT
  Filled 2022-03-17: qty 3

## 2022-03-17 MED ORDER — METHOCARBAMOL 500 MG PO TABS
500.0000 mg | ORAL_TABLET | Freq: Four times a day (QID) | ORAL | Status: DC | PRN
  Administered 2022-03-17 – 2022-03-18 (×2): 500 mg via ORAL
  Filled 2022-03-17 (×2): qty 1

## 2022-03-17 MED ORDER — PROPOFOL 10 MG/ML IV BOLUS
INTRAVENOUS | Status: AC
  Filled 2022-03-17: qty 20

## 2022-03-17 MED ORDER — LORAZEPAM 1 MG PO TABS
1.0000 mg | ORAL_TABLET | Freq: Four times a day (QID) | ORAL | Status: DC | PRN
Start: 2022-03-17 — End: 2022-03-18
  Administered 2022-03-17: 1 mg via ORAL
  Filled 2022-03-17: qty 1

## 2022-03-17 MED ORDER — NEBIVOLOL HCL 5 MG PO TABS
5.0000 mg | ORAL_TABLET | Freq: Every day | ORAL | Status: DC
  Administered 2022-03-17: 5 mg via ORAL
  Filled 2022-03-17 (×2): qty 1

## 2022-03-17 MED ORDER — ALUM & MAG HYDROXIDE-SIMETH 200-200-20 MG/5ML PO SUSP
30.0000 mL | Freq: Four times a day (QID) | ORAL | Status: DC | PRN
  Administered 2022-03-18: 30 mL via ORAL
  Filled 2022-03-17: qty 30

## 2022-03-17 NOTE — Progress Notes (Signed)
Subjective: POD # 1 s/p right foot I&D, bone biopsy. Nerve block still intact, no pain. He is ready to go home.   Objective: Vitals:   03/17/22 0448 03/17/22 0939  BP: (!) 145/83 (!) 154/92  Pulse: (!) 102 88  Resp: 18 18  Temp: 97.8 F (36.6 C) 98 F (36.7 C)  SpO2: 97% 100%    AAO x3, NAD Bandage clean, dry, intact.  Incision well coapted with sutures intact.  There is decreased edema to the foot there is no significant cellulitis.  There is no fluctuation or crepitation but there is no malodor.  Bruising present to the foot extending to just distal of the ankle. No malodor.  No pain with calf compression, swelling, warmth, erythema  Assessment: POD #1 s/p right foot I&D, bone biopsy  Plan: -WBC 6 . 1, afebrile -Wound culture: NGTD- prelim -Pathology: pending  -NWB CAM boot -We will plan for discharge on Thursday morning with 1 week of oral antibiotics, likely Zyvox. -Encouraged importance of compliance. -Follow-up on Monday with Dr. Ralene Cork in the office. Appointment has already been made.   Ovid Curd, DPM

## 2022-03-17 NOTE — Progress Notes (Signed)
Alerted by NT that patient's HR was in the 120s Sinus Tach     03/17/22 2045  Vitals  Temp 98.6 F (37 C)  Temp Source Oral  BP (!) 143/78  MAP (mmHg) 91  BP Location Right Arm  BP Method Automatic  Patient Position (if appropriate) Lying  Pulse Rate (!) 123  Pulse Rate Source Monitor  Resp 18  MEWS COLOR  MEWS Score Color Yellow  Oxygen Therapy  SpO2 98 %  O2 Device Room Air  MEWS Score  MEWS Temp 0  MEWS Systolic 0  MEWS Pulse 2  MEWS RR 0  MEWS LOC 0  MEWS Score 2     Pt says he feels warm and he's presenting an anxious demeanor, tapping left foot repeatedly. Room was too hot as well, I turned down temperature. Pt's family explained that he seems like he is having anxiety since he usually does not sit around a lot. Patient requesting an anti-anxiety medication as well.   Will message HCP for further orders or recommendations.

## 2022-03-17 NOTE — Progress Notes (Signed)
Orthopedic Tech Progress Note Patient Details:  [name] 10/30/1980 [id number]  Patient ID: George Shelton, male   DOB: 1980/11/22, 41 y.o.   MRN: [id number]  [name] 03/17/2022, 11:38 AM Right cam walker applied in room

## 2022-03-17 NOTE — Anesthesia Postprocedure Evaluation (Signed)
Anesthesia Post Note  Patient: George Shelton  Procedure(s) Performed: TRANSMETATARSAL AMPUTATION/I&D RIGHT FOOT/BONE BIOPSY (Right: Toe)     Patient location during evaluation: PACU Anesthesia Type: Regional Level of consciousness: awake and alert Pain management: pain level controlled Vital Signs Assessment: post-procedure vital signs reviewed and stable Respiratory status: spontaneous breathing Cardiovascular status: stable Anesthetic complications: no   No notable events documented.  Last Vitals:  Vitals:   03/16/22 2128 03/16/22 2233  BP: 139/84 (!) 144/93  Pulse: 96 99  Resp: 18 18  Temp:  36.4 C  SpO2: 97%     Last Pain:  Vitals:   03/16/22 2233  TempSrc: Oral  PainSc:                  Lewie Loron

## 2022-03-17 NOTE — Progress Notes (Signed)
PT Cancellation Note / Screen  Patient Details Name: George Shelton MRN: [id number] DOB: 07-22-1981   Cancelled Treatment:    Reason Eval/Treat Not Completed: PT screened, no needs identified, will sign off Pt denies need for PT at this time.  Pt reports he had similar surgery earlier this year.  Pt states he is aware he is NWB and has CAM walker.  Pt also reports he has no stairs at home.  Pt also had Iwalk and knee scooter if needed as well.  Since pt denies need for PT, PT to sign off.   Janan Halter P[place] 03/17/2022, 12:54 PM Thomasene Mohair PT, DPT Physical Therapist [facility] Preferred contact method: Secure Chat Weekend Pager Only: [phone] Office: [phone]

## 2022-03-17 NOTE — Plan of Care (Signed)
  Problem: Education: Goal: Knowledge of General Education information will improve Description: Including pain rating scale, medication(s)/side effects and non-pharmacologic comfort measures Outcome: Progressing   Problem: Activity: Goal: Risk for activity intolerance will decrease Outcome: Progressing   Problem: Nutrition: Goal: Adequate nutrition will be maintained Outcome: Progressing   Problem: Pain Managment: Goal: General experience of comfort will improve Outcome: Progressing   Problem: Safety: Goal: Ability to remain free from injury will improve Outcome: Progressing   Problem: Education: Goal: Required Educational Video(s) Outcome: Progressing

## 2022-03-17 NOTE — Progress Notes (Signed)
Transition of Care [facility]) Screening Note  Patient Details  Name: George Shelton Date of Birth: Apr 06, 1981  Transition of Care [facility]) CM/SW Contact:    Ewing Schlein, LCSW Phone Number: 03/17/2022, 1:13 PM  Transition of Care Department [facility]) has reviewed patient and no TOC needs have been identified at this time. We will continue to monitor patient advancement through interdisciplinary progression rounds. If new patient transition needs arise, please place a TOC consult.

## 2022-03-17 NOTE — Progress Notes (Signed)
PROGRESS NOTE    George Shelton  IPJ:[id number] DOB: 06-10-1981 DOA: 03/15/2022 PCP: Oneita Hurt, No    Brief Narrative:   George Shelton is a 41 y.o. male with past medical history significant for remote history of IVDU, history of MRSA endocarditis 2013, right great toe osteomyelitis s/p amputation 2018, right second toe osteomyelitis 11/2021 s/p TMA June 2023 following failure of IV antibiotics who presented to [facility] ED on 8/14 with right foot pain.  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  He immediately felt severe 10 out of 10 sharp pain and was having difficulty weightbearing.  At that time he was evaluated at [facility] but left AMA.  In the ED, MR right foot notable for prior transmetatarsal amputation now with diffuse marrow edema involving the majority of the first through fifth metatarsal consistent with osteomyelitis, first, second and third intermetatarsal dorsal fluid collection which possibly communicates suspicious for a large abscess, small abscess plantar base fifth metatarsal amputation stump.  Podiatry, Dr. Ardelle Anton was consulted.  TRH consulted for further evaluation and management of concern for osteomyelitis in the setting of right foot injury.  Assessment & Plan:   Right foot crush injury Concern for abscess, osteomyelitis Patient presenting to the ED following injury to his right foot after being crushed by equipment at work.  Previous history of osteomyelitis s/p transmetatarsal amputation June 2023.  MR right foot with notable prior transmetatarsal imitation with diffuse marrow edema involving the majority of the first through fifth metatarsal consistent with osteomyelitis, first, second and third intermetatarsal dorsal fluid collection which possibly communicates suspicious for a large abscess, small abscess plantar base fifth metatarsal amputation stump.   Podiatry was consulted and patient underwent bone biopsy in the OR by Dr. Ardelle Anton but no revision of TMA was performed as there was no abscess/purulence found and bone appeared to be hard.  Patient remains afebrile without leukocytosis. --Podiatry, Dr. Ardelle Anton following, appreciate assistance --Operative culture with no organisms on Gram stain, further culture pending --Pathology: Pending --Continue linezolid 600 mg IV q12h --Robaxin 500 mg p.o. every 6 hours as needed muscle spasms --Gabapentin 300 mg p.o. nightly --Tramadol 50 mg p.o. every 6 hours as needed moderate pain --Podiatry plans follow-up Monday with Dr. Ralene Cork outpatient, anticipate discharge home likely tomorrow  Hx essential hypertension Noncompliant with home Bystolic. --Recommend restarted on discharge and follow-up with PCP.  Hx of OSA Noncompliance with home CPAP unit.  Encouraged to restart on discharge.  Outpatient follow-up with PCP.  Polysubstance abuse History of IV drug abuse complicated by frequent MRSA infections and infective endocarditis.  Continue to encourage cessation, reports now clean for 8 years.  Hypokalemia Repleted during hospitalization.   DVT prophylaxis: SCDs Start: 03/15/22 2224    Code Status: Full Code Family Communication: Updated family present at bedside  Disposition Plan:  Level of care: Med-Surg Status is: Inpatient Remains inpatient appropriate because: IV Dilaudid, awaiting podiatry sign off, anticipate discharge home tomorrow with continued oral antibiotics until outpatient follow-up.    Consultants:  Podiatry, Dr. Ardelle Anton  Procedures:  Bone biopsy, podiatry, Dr. Ardelle Anton 8/15  Antimicrobials:  Linezolid 8/14>> Cefepime 8/14 - 8/14   Subjective: Patient seen and examined at bedside, resting comfortably.  Lying in bed.  Family present.  Multiple questions about his lab results; answered with no further questions.  Seen by podiatry with plans likely discharge home  tomorrow on  oral linezolid with outpatient follow-up.  No other questions or concerns at this time.  Denies headache, no dizziness, no chest pain, no palpitations, no shortness of breath, no abdominal pain, no fever/chills/night sweats, no nausea/vomiting/diarrhea, no cough/congestion, no focal weakness, no fatigue.  No acute concerns overnight per nursing staff.  Objective: Vitals:   03/17/22 0448 03/17/22 0500 03/17/22 0939 03/17/22 1146  BP: (!) 145/83  (!) 154/92   Pulse: (!) 102  88   Resp: 18  18   Temp: 97.8 F (36.6 C)  98 F (36.7 C)   TempSrc: Oral     SpO2: 97%  100% 98%  Weight:  88.7 kg    Height:        Intake/Output Summary (Last 24 hours) at 03/17/2022 1258 Last data filed at 03/17/2022 0954 Gross per 24 hour  Intake 2440.88 ml  Output 1785 ml  Net 655.88 ml   Filed Weights   03/15/22 1743 03/17/22 0500  Weight: 88.9 kg 88.7 kg    Examination:  Physical Exam: GEN: NAD, alert and oriented x 3, wd/wn HEENT: NCAT, PERRL, EOMI, sclera clear, MMM PULM: CTAB w/o wheezes/crackles, normal respiratory effort, on room air CV: RRR w/o M/G/R GI: abd soft, NTND, NABS, no R/G/M MSK: no peripheral edema, muscle strength globally intact 5/5 bilateral upper/lower extremities NEURO: CN II-XII intact, no focal deficits, sensation to light touch intact PSYCH: normal mood/affect Integumentary: Right foot with surgical dressing/Ace wrap in place, clean/dry/intact    Data Reviewed: I have personally reviewed following labs and imaging studies  CBC: Recent Labs  Lab 03/15/22 1825 03/16/22 0537 03/17/22 0520  WBC 6.1 4.4 6.1  NEUTROABS 4.7 2.5  --   HGB 13.1 13.5 14.4  HCT 37.9* 39.8 42.1  MCV 87.3 88.8 88.3  PLT 226 211 235   Basic Metabolic Panel: Recent Labs  Lab 03/15/22 1825 03/16/22 0537 03/17/22 0520  NA 140 139 138  K 3.2* 3.5 4.3  CL 108 106 104  CO2 25 25 26   GLUCOSE 86 81 282*  BUN 9 8 12   CREATININE 0.97 1.00 1.21  CALCIUM 8.7* 8.6* 9.1  MG   --  1.8  --    GFR: Estimated Creatinine Clearance: 89.9 mL/min (by C-G formula based on SCr of 1.21 mg/dL). Liver Function Tests: Recent Labs  Lab 03/15/22 1825 03/16/22 0537  AST 43* 35  ALT 49* 43  ALKPHOS 66 57  BILITOT 0.8 1.0  PROT 6.7 6.0*  ALBUMIN 3.9 3.4*   No results for input(s): "LIPASE", "AMYLASE" in the last 168 hours. No results for input(s): "AMMONIA" in the last 168 hours. Coagulation Profile: Recent Labs  Lab 03/15/22 1825  INR 0.9   Cardiac Enzymes: Recent Labs  Lab 03/16/22 0537  CKTOTAL 625*   BNP (last 3 results) No results for input(s): "PROBNP" in the last 8760 hours. HbA1C: No results for input(s): "HGBA1C" in the last 72 hours. CBG: No results for input(s): "GLUCAP" in the last 168 hours. Lipid Profile: No results for input(s): "CHOL", "HDL", "LDLCALC", "TRIG", "CHOLHDL", "LDLDIRECT" in the last 72 hours. Thyroid Function Tests: No results for input(s): "TSH", "T4TOTAL", "FREET4", "T3FREE", "THYROIDAB" in the last 72 hours. Anemia Panel: No results for input(s): "VITAMINB12", "FOLATE", "FERRITIN", "TIBC", "IRON", "RETICCTPCT" in the last 72 hours. Sepsis Labs: Recent Labs  Lab 03/15/22 1815  LATICACIDVEN 0.7    Recent Results (from the past 240 hour(s))  Blood Culture (routine x 2)     Status: None (Preliminary result)   Collection  Time: 03/15/22  6:15 PM   Specimen: BLOOD  Result Value Ref Range Status   Specimen Description   Final    BLOOD BLOOD LEFT FOREARM Performed at [facility], [address], [place], [place] [address]    Special Requests   Final    BOTTLES DRAWN AEROBIC AND ANAEROBIC Blood Culture adequate volume Performed at [facility], [address] [place], [place] [address]    Culture   Final    NO GROWTH 2 DAYS Performed at [facility], [address] [place], [place] [address]    Report Status PENDING  Incomplete  Blood Culture (routine x 2)     Status: None  (Preliminary result)   Collection Time: 03/15/22  6:25 PM   Specimen: BLOOD  Result Value Ref Range Status   Specimen Description   Final    BLOOD BLOOD RIGHT FOREARM Performed at [facility], [address], [place], [place] [address]    Special Requests   Final    BOTTLES DRAWN AEROBIC AND ANAEROBIC Blood Culture adequate volume Performed at [facility], [address] [place], [place] [address]    Culture   Final    NO GROWTH 2 DAYS Performed at [facility], [address], [place], [place] [address]    Report Status PENDING  Incomplete  Aerobic/Anaerobic Culture w Gram Stain (surgical/deep wound)     Status: None (Preliminary result)   Collection Time: 03/16/22  5:42 PM   Specimen: Foot, Right; Abscess  Result Value Ref Range Status   Specimen Description   Final    ABSCESS RIGHT FOOT Performed at [facility], [address] [place], [place] [address]    Special Requests   Final    NONE Performed at [facility], [address] [place], [place] [address]    Gram Stain NO ORGANISMS SEEN NO WBC SEEN   Final   Culture   Final    NO GROWTH < 24 HOURS Performed at [facility], [address] [place], [place] [address]    Report Status PENDING  Incomplete         Radiology Studies: DG Ankle Complete Right  Result Date: 03/16/2022 CLINICAL DATA:  Acute fracture, right. EXAM: RIGHT ANKLE - COMPLETE 3+ VIEW COMPARISON:  CT examination dated March 13, 2022 FINDINGS: There is a small linear osseous fragment about the dorsal aspect of the talar head. Ankle mortise is congruent. Subtalar joint is intact. No other acute osseous abnormality. Generalized soft tissue swelling about the ankle. IMPRESSION: 1. Small linear osseous fragment about the dorsal aspect of the talar head, which may be sequela of degenerative process or an acute avulsion fracture, clinical correlation is suggested.  2. Ankle mortise and subtalar joints are intact. 3. Generalized subcutaneous soft tissue swelling about the ankle/visualized foot. Electronically Signed   By: Larose Hires D.O.   On: 03/16/2022 09:41   MR FOOT RIGHT WO CONTRAST  Result Date: 03/15/2022 CLINICAL DATA:  Right foot amputation stump infection. EXAM: MRI OF THE RIGHT FOREFOOT WITHOUT CONTRAST TECHNIQUE: Multiplanar, multisequence MR imaging of the right forefoot was performed. No intravenous contrast was administered. COMPARISON:  Right foot x-rays from same day. CT right foot dated March 13, 2022. FINDINGS: Bones/Joint/Cartilage Prior transmetatarsal amputation. Diffuse marrow edema involving the majority of the first through fifth metatarsals, sparing the bases. Corresponding T1 marrow signal is mildly decreased. No fracture or  dislocation. Mild talonavicular and calcaneocuboid osteoarthritis. No joint effusion. Ligaments Lisfranc ligament is intact. Muscles and Tendons Postsurgical changes of the flexor and extensor tendons. No tenosynovitis. Increased T2 signal within the intrinsic muscles of the forefoot, nonspecific, but likely related to diabetic muscle changes. Soft tissue First, second, and third intermetatarsal fluid collections which possibly communicate, suspicious for abscess (series 9, image 12). Small fluid collection at the plantar base of the fifth metatarsal amputation stump. Diffuse soft tissue swelling. IMPRESSION: 1. Prior transmetatarsal amputation. Diffuse marrow edema involving the majority of the first through fifth metatarsals, sparing the bases, consistent with osteomyelitis. 2. First, second, and third intermetatarsal fluid collections which possibly communicate, suspicious for large abscess. Small abscess at the plantar base of the fifth metatarsal amputation stump. Electronically Signed   By: Obie Dredge M.D.   On: 03/15/2022 21:20   DG Foot Complete Right  Result Date: 03/15/2022 Please see detailed radiograph  report in office note.       Scheduled Meds:  gabapentin  300 mg Oral QHS   Continuous Infusions:  lactated ringers 100 mL/hr at 03/17/22 0954   linezolid (ZYVOX) IV 600 mg (03/17/22 1000)     LOS: 1 day    Time spent: 51 minutes spent on chart review, discussion with nursing staff, consultants, updating family and interview/physical exam; more than 50% of that time was spent in counseling and/or coordination of care.    Alvira Philips [place], DO [facility] Available via Epic secure chat 7am-7pm After these hours, please refer to coverage provider listed on [email or web address] 03/17/2022, 12:58 PM

## 2022-03-18 DIAGNOSIS — L03115 Cellulitis of right lower limb: Secondary | ICD-10-CM | POA: Diagnosis not present

## 2022-03-18 LAB — SURGICAL PATHOLOGY

## 2022-03-18 MED ORDER — TRAMADOL HCL 50 MG PO TABS: 50.0000 mg | ORAL_TABLET | Freq: Four times a day (QID) | ORAL | 0 refills | Status: DC | PRN

## 2022-03-18 MED ORDER — NEBIVOLOL HCL 10 MG PO TABS: 10.0000 mg | ORAL_TABLET | Freq: Every day | ORAL | 2 refills | Status: DC

## 2022-03-18 MED ORDER — LINEZOLID 600 MG PO TABS: 600.0000 mg | ORAL_TABLET | Freq: Two times a day (BID) | ORAL | 0 refills | Status: AC
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
## 2022-03-18 NOTE — Progress Notes (Signed)
Reviewed written d/c instructions w pt and all questions answered. He verbalized understanding. D/ C via w/c w all belongings in stable condition. 

## 2022-03-18 NOTE — Progress Notes (Signed)
Subjective: POD #  s2p right foot I&D, bone biopsy.  Blood pressure, heart rate elevated overnight.  He states that he had a "breathing treatment".  Denies any fevers or chills.  Objective: Vitals:   03/17/22 0448 03/17/22 0939  BP: (!) 145/83 (!) 154/92  Pulse: (!) 102 88  Resp: 18 18  Temp: 97.8 F (36.6 C) 98 F (36.7 C)  SpO2: 97% 100%    AAO x3, NAD Bandage clean, dry, intact.  Bloody strikethrough noted on the gauze.  Upon removal incision well coapted with sutures intact.  There is mild edema but appears to be improved and there is no significant erythema, cellulitis.  There is no fluctuance or crepitation but there is no malodor.  No pain with calf compression, swelling, warmth, erythema  Assessment: POD #2 s/p right foot I&D, bone biopsy  Plan: -Afebrile, no blood work to review.  -Wound culture: NGTD- prelim -Pathology: pending  -NWB CAM boot -Clinically foot is doing better.  From podiatry standpoint able to be discharged today with a week of oral linezolid. -Encouraged importance of compliance. -Follow-up on Monday with Dr. Ralene Cork in the office. Appointment has already been made.  Return precautions discussed  Ovid Curd, DPM

## 2022-03-20 LAB — CULTURE, BLOOD (ROUTINE X 2)
Culture: NO GROWTH
Culture: NO GROWTH
Special Requests: ADEQUATE
Special Requests: ADEQUATE

## 2022-03-21 LAB — AEROBIC/ANAEROBIC CULTURE W GRAM STAIN (SURGICAL/DEEP WOUND)
Culture: NO GROWTH
Gram Stain: NONE SEEN

## 2022-03-22 ENCOUNTER — Ambulatory Visit (INDEPENDENT_AMBULATORY_CARE_PROVIDER_SITE_OTHER): Payer: BC Managed Care – PPO | Admitting: Podiatry

## 2022-03-22 DIAGNOSIS — Z9889 Other specified postprocedural states: Secondary | ICD-10-CM

## 2022-03-22 DIAGNOSIS — Z91199 Patient's noncompliance with other medical treatment and regimen due to unspecified reason: Secondary | ICD-10-CM

## 2022-03-22 NOTE — Progress Notes (Signed)
No show

## 2022-03-24 ENCOUNTER — Other Ambulatory Visit: Payer: Self-pay | Admitting: Podiatry

## 2022-03-24 DIAGNOSIS — M86671 Other chronic osteomyelitis, right ankle and foot: Secondary | ICD-10-CM

## 2022-03-24 DIAGNOSIS — R0602 Shortness of breath: Secondary | ICD-10-CM | POA: Diagnosis not present

## 2022-03-24 DIAGNOSIS — J4541 Moderate persistent asthma with (acute) exacerbation: Secondary | ICD-10-CM | POA: Diagnosis not present

## 2022-03-24 DIAGNOSIS — F901 Attention-deficit hyperactivity disorder, predominantly hyperactive type: Secondary | ICD-10-CM | POA: Diagnosis not present

## 2022-03-24 NOTE — Op Note (Signed)
PATIENT:  George Shelton  41 y.o. male   PRE-OPERATIVE DIAGNOSIS:  Abscess, osteomyelitis   POST-OPERATIVE DIAGNOSIS:  Abscess, osteomyelitis   PROCEDURE:  Procedure(s) with comments: TRANSMETATARSAL AMPUTATION/I&D RIGHT FOOT/BONE BIOPSY (Right) - patient would like a block   SURGEON:  Surgeon(s) and Role:    * Jettson Crable, Lesia Sago, DPM - Primary   PHYSICIAN ASSISTANT:    ASSISTANTS: none    ANESTHESIA:   general   EBL:  60 mL    BLOOD ADMINISTERED:none   DRAINS: none    LOCAL MEDICATIONS USED:  NONE   SPECIMEN:  Source of Specimen:  wound culture, bone pathology   DISPOSITION OF SPECIMEN:  PATHOLOGY   COUNTS:  YES   TOURNIQUET:  * Missing tourniquet times found for documented tourniquets in log: [id number] *   DICTATION: .Dragon Dictation   PLAN OF CARE: Admit to inpatient    PATIENT DISPOSITION:  PACU - hemodynamically stable.   Delay start of Pharmacological VTE agent (>24hrs) due to surgical blood loss or risk of bleeding: no   Intraoperative findings: No abscess/purulence found. Bone appeared hard. Therefore I did not proceed with revision of the TMA but I took bone pathology and wound culture.   Indications for surgery: 41 year old male admitted to the hospital with concerns of infection, osteomyelitis of his right foot.  Previous underwent transmetatarsal amputation.  He states the incision was healing well and the foot was normal until Saturday when he had an injury.  Originally seen at [facility] and then left AGAINST MEDICAL ADVICE and he came to [facility] for further evaluation and treatment.  MRI was concerning for osteomyelitis and abscess.  Given his history of infections I recommended surgical intervention and possible revision of the transmetatarsal potation site, I&D.  We discussed the surgery as well as postoperative course in detail on multiple occasions.  Alternatives, risks, complications were discussed.  No promises or guarantees were given second  the procedure and all questions were answered to the best my ability.  Procedure in detail: The patient's with verbally and visually identified by myself and nursing staff in the anesthesia staff preoperatively.  Transferred to the operating room via stretcher and placed on the operative table in supine position.  After adequate plane of anesthesia obtained right lower extremities and scrubbed prepped and draped in normal sterile fashion.  Attention was directed on the along the distal portion of the foot on the previous transmetatarsal amputation site and his incision was made proximal to the area of the previous incision given skin breakdown.  This was superficial skin breakdown no purulence noted.  Incision about 3 cm in length.  Incision was made with a 15 blade scalpel from skin to bone.  There was no purulence noted initially only bleeding.  I then probed along the interspaces was not able to identify any purulence.  I did take a wound culture.  I then identified the bone and a bone clinically appear to be hard in nature, white in color and appear to be viable.  Due to this I did not elect to proceed with a transmetatarsal amputation and I did perform a bone biopsy.  I utilized a rongeur to remove a piece of bone and sent this to pathology for evaluation.  I did make a secondary incision on the fifth metatarsal remnant with a 15 by scalpel from skin to bone.  I did this to make sure there is no purulence in this area which I was not able to identify any.  Copiously irrigated the incision followed by hemostasis.  I then closed the incision with 3-0 nylon.  Betadine skin over the incision, Xeroform dry sterile dressing.  He was awoken anesthesia and found to tolerate the procedure well without any complications.  Transferred to PACU vital signs stable and vascular status intact.

## 2022-04-01 ENCOUNTER — Encounter: Payer: Self-pay | Admitting: Internal Medicine

## 2022-04-01 ENCOUNTER — Ambulatory Visit (INDEPENDENT_AMBULATORY_CARE_PROVIDER_SITE_OTHER): Payer: BC Managed Care – PPO | Admitting: Internal Medicine

## 2022-04-01 ENCOUNTER — Other Ambulatory Visit: Payer: Self-pay

## 2022-04-01 VITALS — BP 149/98 | HR 109 | Ht 73.0 in | Wt 187.0 lb

## 2022-04-01 DIAGNOSIS — S99921D Unspecified injury of right foot, subsequent encounter: Secondary | ICD-10-CM

## 2022-04-01 NOTE — Patient Instructions (Signed)
I suspect the object falling on your foot had resulted in the imaging finding... the actual surgical exploration, culture, and labs otherwise do not reveal sign of infection   We will check blood test again for inflammation level and repeat exam in 1-2 weeks. If your foot continues to heal well and labs remain normal, we can part way then   Please follow up with your foot doctor as well

## 2022-04-01 NOTE — Progress Notes (Signed)
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   MRN: [id number]  Chief Complaint  Patient presents with   Follow-up    HPI:  George Shelton is a 41 y.o. male here for hospital f/u foot injury  He was previously seen by dr Luciana Axe 11/2021 for right 2nd toe om. He lacked improvement on iv abx and ended up with tma 6/20. He did well since  He was admitted to [facility] long on 8/14 due to a heavy object falling on his tma stump with pain/swelling  Workup in hospital showed mri hyperintensity in distal tma stump. Podiatry explored and there was no pus or sign of bone infection  Crp was normal  Culture operative was negative  The bone path:  Reactive cortical and trabecular bone with fibrotic marrow compatible  with chronic osteomyelitis  Negative for acute inflammation   He was given 1 week linezolid on discharge     He is here today, most sutures removed, the incision is healing nicely. No fever/chill. Mild swelling. No pus   Allergies  Allergen Reactions   Codeine    Other     History of drug addiction. Does not want pain medication of any kind. Sober/clean for 8 1/2 years.   Vancomycin     "It kills my blood cells"       Outpatient Medications Prior to Visit  Medication Sig Dispense Refill   albuterol (VENTOLIN HFA) 108 (90 Base) MCG/ACT inhaler SMARTSIG:1-2 Puff(s) Via Inhaler Every 4-6 Hours PRN     FLOVENT HFA 110 MCG/ACT inhaler SMARTSIG:2 Puff(s) By Mouth Twice Daily     gabapentin (NEURONTIN) 600 MG tablet Take 1 tablet (600 mg total) by mouth 3 (three) times daily. (Patient taking differently: Take 600 mg by mouth 3 (three) times daily as needed (pain).) 90 tablet 2   ibuprofen (ADVIL) 200 MG tablet Take 200 mg by mouth every 6 (six) hours as needed for moderate pain.     nebivolol (BYSTOLIC) 10 MG tablet Take 1 tablet (10 mg total) by mouth daily. 30 tablet 2   ondansetron (ZOFRAN) 4 MG tablet Take 1 tablet (4 mg total) by mouth every 8 (eight) hours as needed for nausea or vomiting. 20 tablet 0   traMADol (ULTRAM) 50 MG tablet Take 1 tablet (50 mg total) by mouth every 6 (six) hours as needed for moderate pain. 20 tablet 0   gabapentin (NEURONTIN) 300 MG capsule Take 1 capsule (300 mg total) by mouth at bedtime. 90 capsule 3   No facility-administered medications prior to visit.     Social History   Socioeconomic History   Marital status: Married    Spouse name: Not on file   Number of children: Not on  file   Years of education: Not on file   Highest education level: Not on file  Occupational History   Not on file  Tobacco Use   Smoking status: Former    Packs/day: 1.50    Types: Cigarettes    Quit date: 11/2021    Years since quitting: 0.3   Smokeless tobacco: Former    Types: Chew  Substance and Sexual Activity   Alcohol use: Not Currently    Comment: occasionally   Drug use: Never   Sexual activity: Not on file  Other Topics Concern   Not on file  Social History Narrative   Not on file   Social Determinants of Health   Financial Resource Strain: Not on file  Food Insecurity: Not on file  Transportation Needs: Not on file  Physical Activity: Not on file  Stress:  Not on file  Social Connections: Not on file  Intimate Partner Violence: Not on file      Review of Systems     Objective:    BP (!) 149/98 Comment: Provider notified  Pulse (!) 109   Ht 6\' 1"  (1.854 m)   Wt 187 lb (84.8 kg)   SpO2 98%   BMI 24.67 kg/m  Nursing note and vital signs reviewed.  Physical Exam     General/constitutional: on the phone throughout question without much eye contact, terse, cooperative HEENT: Normocephalic, PER, Conj Clear, EOMI, Oropharynx clear Neck supple CV: rrr no mrg Lungs: clear to auscultation, normal respiratory effort Abd: Soft, Nontender Ext: no edema Skin/msk: slight duskiness right foot; incision mostly healed with a few small spots scabbing; no purulence/fluctuance/tenderness Neuro: nonfocal         Labs:  Micro:  Serology:  Imaging: 8/14 mri right foot 1. Prior transmetatarsal amputation. Diffuse marrow edema involving the majority of the first through fifth metatarsals, sparing the bases, consistent with osteomyelitis. 2. First, second, and third intermetatarsal fluid collections which possibly communicate, suspicious for large abscess. Small abscess at the plantar base of the fifth metatarsal amputation stump.   Assessment & Plan:   Problem List Items Addressed This Visit   None Visit Diagnoses     Injury of right foot, subsequent encounter    -  Primary   Relevant Orders   CBC w/Diff   C-reactive protein   COMPLETE METABOLIC PANEL WITH GFR         No orders of the defined types were placed in this encounter.    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  Mri shows possible om of the tma stump and abscess. Podiatry evaluated. Underwent exploration of tma stump --> no sign of infection; bone hard/viable. Culture negative. Discharged on 7 days linezolid  Pathology reviewed reactive changes suggestive of chronic OM. But clinically more suggestive of inflammation from trauma  Clinically he doesn't have osteomyelitis. Crp was normal   Will repeat inflammatory labs today  Discuss with patient who appears to want antibiotics and believe he has infection. Advised him of our plan to monitor with serial labs and repeat evaluation. Will see him again in 1-2 weeks. No sign of infection based on history and exam today   Follow-up: Return in  about 2 weeks (around 04/15/2022).      George Band, MD [facility] 04/01/2022, 10:37 AM

## 2022-04-02 ENCOUNTER — Encounter: Payer: Self-pay | Admitting: Podiatry

## 2022-04-02 LAB — CBC WITH DIFFERENTIAL/PLATELET
Absolute Monocytes: 580 cells/uL (ref 200–950)
Basophils Absolute: 41 cells/uL (ref 0–200)
Basophils Relative: 0.6 %
Eosinophils Absolute: 97 cells/uL (ref 15–500)
Eosinophils Relative: 1.4 %
HCT: 37.6 % — ABNORMAL LOW (ref 38.5–50.0)
Hemoglobin: 13 g/dL — ABNORMAL LOW (ref 13.2–17.1)
Lymphs Abs: 1173 cells/uL (ref 850–3900)
MCH: 29.9 pg (ref 27.0–33.0)
MCHC: 34.6 g/dL (ref 32.0–36.0)
MCV: 86.4 fL (ref 80.0–100.0)
MPV: 8.4 fL (ref 7.5–12.5)
Monocytes Relative: 8.4 %
Neutro Abs: 5009 cells/uL (ref 1500–7800)
Neutrophils Relative %: 72.6 %
Platelets: 305 10*3/uL (ref 140–400)
RBC: 4.35 10*6/uL (ref 4.20–5.80)
RDW: 12.8 % (ref 11.0–15.0)
Total Lymphocyte: 17 %
WBC: 6.9 10*3/uL (ref 3.8–10.8)

## 2022-04-02 LAB — COMPLETE METABOLIC PANEL WITH GFR
AG Ratio: 1.8 (calc) (ref 1.0–2.5)
ALT: 41 U/L (ref 9–46)
AST: 35 U/L (ref 10–40)
Albumin: 3.9 g/dL (ref 3.6–5.1)
Alkaline phosphatase (APISO): 72 U/L (ref 36–130)
BUN: 16 mg/dL (ref 7–25)
CO2: 28 mmol/L (ref 20–32)
Calcium: 9.3 mg/dL (ref 8.6–10.3)
Chloride: 103 mmol/L (ref 98–110)
Creat: 1.18 mg/dL (ref 0.60–1.29)
Globulin: 2.2 g/dL (calc) (ref 1.9–3.7)
Glucose, Bld: 114 mg/dL — ABNORMAL HIGH (ref 65–99)
Potassium: 3.2 mmol/L — ABNORMAL LOW (ref 3.5–5.3)
Sodium: 140 mmol/L (ref 135–146)
Total Bilirubin: 0.5 mg/dL (ref 0.2–1.2)
Total Protein: 6.1 g/dL (ref 6.1–8.1)
eGFR: 79 mL/min/{1.73_m2} (ref >=60)

## 2022-04-02 LAB — C-REACTIVE PROTEIN: CRP: 5.1 mg/L (ref <8.0)

## 2022-04-02 NOTE — Telephone Encounter (Signed)
Called patient to discuss labs and concern for infection. Eased patients mind on concern for labs and look of foot at this time do not feel active infection present but will keep an eye on the area.

## 2022-04-14 ENCOUNTER — Other Ambulatory Visit: Payer: Self-pay

## 2022-04-14 ENCOUNTER — Encounter: Payer: Self-pay | Admitting: Internal Medicine

## 2022-04-14 ENCOUNTER — Ambulatory Visit (INDEPENDENT_AMBULATORY_CARE_PROVIDER_SITE_OTHER): Payer: BC Managed Care – PPO | Admitting: Internal Medicine

## 2022-04-14 VITALS — BP 137/92 | HR 105 | Temp 98.2°F

## 2022-04-14 DIAGNOSIS — R9389 Abnormal findings on diagnostic imaging of other specified body structures: Secondary | ICD-10-CM | POA: Diagnosis not present

## 2022-04-14 DIAGNOSIS — S99921D Unspecified injury of right foot, subsequent encounter: Secondary | ICD-10-CM

## 2022-04-14 LAB — FUNGUS CULTURE RESULT

## 2022-04-14 LAB — FUNGAL ORGANISM REFLEX

## 2022-04-14 LAB — FUNGUS CULTURE WITH STAIN

## 2022-04-14 NOTE — Patient Instructions (Signed)
Your foot looks really good today without active sign of infection  If your blood test crp looks normal again today likely approaching 100% that you didn't have a bone infection    Continue to monitor for increased redness/incision breakdown/pus for the next few weeks --> if present let me and your foot doctor know   Please stay off your foot for the next few weeks while the bone/incision fully heal

## 2022-04-14 NOTE — Progress Notes (Signed)
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   MRN: [id number]  Chief Complaint  Patient presents with   Follow-up    Injury of right foot, subsequent encounter    HPI:  George Shelton is a 41 y.o. male here for hospital f/u foot injury  He was previously seen by dr Luciana Axe 11/2021 for right 2nd toe om. He lacked improvement on iv abx and ended up with tma 6/20. He did well since  He was admitted to [facility] on 8/14-8/17 due to a heavy object falling on his tma stump with pain/swelling  Workup in hospital showed mri hyperintensity in distal tma stump. Podiatry explored and there was no pus or sign of bone infection  Crp was normal  Culture operative was negative  The bone path:  Reactive cortical and trabecular bone with fibrotic marrow compatible  with chronic osteomyelitis  Negative for acute inflammation   He was given 1 week linezolid on discharge     He is here today, most sutures removed, the incision is healing nicely. No fever/chill. Mild swelling. No pus   ------------ 04/14/22 id clinic f/u He has been off linezolid now for 3 weeks His incision is healing well No f/c Recent crp last visit with me 8/31  remained normal 1 week post linezolid     Allergies  Allergen Reactions   Codeine    Other     History of drug addiction. Does not want pain medication of any kind. Sober/clean for 8 1/2 years.   Vancomycin     "It kills my blood cells"      Outpatient Medications Prior to Visit  Medication Sig Dispense Refill   albuterol (VENTOLIN HFA) 108 (90 Base) MCG/ACT inhaler SMARTSIG:1-2 Puff(s) Via Inhaler Every 4-6 Hours PRN     FLOVENT HFA 110 MCG/ACT inhaler SMARTSIG:2 Puff(s) By Mouth Twice Daily     gabapentin (NEURONTIN) 300 MG capsule Take 1 capsule (300 mg total) by mouth at bedtime. 90 capsule 3   gabapentin (NEURONTIN) 600 MG tablet Take 1 tablet (600 mg total) by mouth 3 (three) times daily. (Patient taking differently: Take 600 mg by mouth 3 (three) times daily as needed (pain).) 90 tablet 2   ibuprofen (ADVIL) 200 MG tablet Take 200 mg by mouth every 6 (six) hours as needed for moderate pain.     nebivolol (BYSTOLIC) 10 MG tablet Take 1 tablet (10 mg total) by mouth daily. 30 tablet 2   ondansetron (ZOFRAN) 4 MG tablet Take 1 tablet (4 mg total) by mouth every 8 (eight) hours as needed for nausea or vomiting. 20 tablet 0   traMADol (ULTRAM) 50 MG tablet Take 1 tablet (50 mg total) by mouth every 6 (six)  hours as needed for moderate pain. 20 tablet 0   No facility-administered medications prior to visit.     Social History   Socioeconomic History   Marital status: Married    Spouse name: Not on file   Number of children: Not on file   Years of education: Not on file   Highest education level: Not on file  Occupational History   Not on file  Tobacco Use   Smoking status: Former    Packs/day: 1.50    Types: Cigarettes    Quit date: 11/2021    Years since quitting: 0.3   Smokeless tobacco: Former    Types: Chew  Substance and Sexual Activity   Alcohol use: Not Currently    Comment: occasionally   Drug use: Never   Sexual activity: Not on file  Other Topics  Concern   Not on file  Social History Narrative   Not on file   Social Determinants of Health   Financial Resource Strain: Not on file  Food Insecurity: Not on file  Transportation Needs: Not on file  Physical Activity: Not on file  Stress: Not on file  Social Connections: Not on file  Intimate Partner Violence: Not on file      Review of Systems    All other ros negative  Objective:    There were no vitals taken for this visit. Nursing note and vital signs reviewed.  Physical Exam     General/constitutional: no distress, pleasant HEENT: Normocephalic, PER, Conj Clear, EOMI, Oropharynx clear Neck supple CV: rrr no mrg Lungs: clear to auscultation, normal respiratory effort Abd: Soft, Nontender Ext: no edema    Skin/msk: no dehiscence, tenderness, swelling, purulence; slight eschar bottom of foot where there seems to be a blister       Neuro: nonfocal         Labs:  Micro:  Serology:  Imaging: 8/14 mri right foot 1. Prior transmetatarsal amputation. Diffuse marrow edema involving the majority of the first through fifth metatarsals, sparing the bases, consistent with osteomyelitis. 2. First, second, and third intermetatarsal fluid collections which possibly communicate, suspicious for large abscess. Small abscess at the plantar base of the fifth metatarsal amputation stump.   Assessment & Plan:   Problem List Items Addressed This Visit   None Visit Diagnoses     Injury of right foot, subsequent encounter    -  Primary   Relevant Orders   CBC w/Diff   C-reactive protein   COMPLETE METABOLIC PANEL WITH GFR   Abnormal MRI            No orders of the defined types were placed in this encounter.    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  Mri shows possible om of the tma stump and abscess. Podiatry evaluated. Underwent exploration of tma stump --> no sign of infection; bone hard/viable. Culture negative. Discharged on 7 days linezolid  Pathology reviewed reactive changes suggestive of chronic OM. But clinically more suggestive of inflammation from trauma  Clinically he doesn't have osteomyelitis. Crp was normal   Will repeat inflammatory labs today  Discuss with patient who appears to want antibiotics and believe he has infection.  Advised him of our plan to monitor with serial labs and repeat evaluation. Will see him again in 1-2 weeks. No sign of infection based on history and exam today   ---------- 04/14/22 ID clinic assessment Clinically the foot continues to look better in terms of swelling/redness post op. No dehiscence. Slight blister which now also healed bottom of foot  Advise staying off foot as podiatry advised  Labs today If crp normal unlikely he had bone infection.   F/u as needed (or if crp is high will see again)    Follow-up: Return if symptoms worsen or fail to improve.      Raymondo Band, MD [facility] 04/14/2022, 4:26 PM

## 2022-04-15 LAB — CBC WITH DIFFERENTIAL/PLATELET
Absolute Monocytes: 405 cells/uL (ref 200–950)
Basophils Absolute: 40 cells/uL (ref 0–200)
Basophils Relative: 0.7 %
Eosinophils Absolute: 228 cells/uL (ref 15–500)
Eosinophils Relative: 4 %
HCT: 40.4 % (ref 38.5–50.0)
Hemoglobin: 13.6 g/dL (ref 13.2–17.1)
Lymphs Abs: 827 cells/uL — ABNORMAL LOW (ref 850–3900)
MCH: 29.8 pg (ref 27.0–33.0)
MCHC: 33.7 g/dL (ref 32.0–36.0)
MCV: 88.4 fL (ref 80.0–100.0)
MPV: 8.5 fL (ref 7.5–12.5)
Monocytes Relative: 7.1 %
Neutro Abs: 4201 cells/uL (ref 1500–7800)
Neutrophils Relative %: 73.7 %
Platelets: 254 10*3/uL (ref 140–400)
RBC: 4.57 10*6/uL (ref 4.20–5.80)
RDW: 12.4 % (ref 11.0–15.0)
Total Lymphocyte: 14.5 %
WBC: 5.7 10*3/uL (ref 3.8–10.8)

## 2022-04-15 LAB — COMPLETE METABOLIC PANEL WITH GFR
AG Ratio: 1.7 (calc) (ref 1.0–2.5)
ALT: 44 U/L (ref 9–46)
AST: 41 U/L — ABNORMAL HIGH (ref 10–40)
Albumin: 4.1 g/dL (ref 3.6–5.1)
Alkaline phosphatase (APISO): 81 U/L (ref 36–130)
BUN/Creatinine Ratio: 9 (calc) (ref 6–22)
BUN: 12 mg/dL (ref 7–25)
CO2: 27 mmol/L (ref 20–32)
Calcium: 8.9 mg/dL (ref 8.6–10.3)
Chloride: 107 mmol/L (ref 98–110)
Creat: 1.3 mg/dL — ABNORMAL HIGH (ref 0.60–1.29)
Globulin: 2.4 g/dL (calc) (ref 1.9–3.7)
Glucose, Bld: 100 mg/dL — ABNORMAL HIGH (ref 65–99)
Potassium: 3.8 mmol/L (ref 3.5–5.3)
Sodium: 142 mmol/L (ref 135–146)
Total Bilirubin: 0.5 mg/dL (ref 0.2–1.2)
Total Protein: 6.5 g/dL (ref 6.1–8.1)
eGFR: 70 mL/min/{1.73_m2} (ref >=60)

## 2022-04-15 LAB — C-REACTIVE PROTEIN: CRP: 4.4 mg/L (ref <8.0)

## 2022-04-20 ENCOUNTER — Encounter (HOSPITAL_COMMUNITY): Admission: EM | Disposition: A | Discharge status: Home / Self Care | Payer: Self-pay | Source: Home / Self Care

## 2022-04-20 ENCOUNTER — Emergency Department (HOSPITAL_COMMUNITY): Payer: BC Managed Care – PPO | Admitting: Anesthesiology

## 2022-04-20 ENCOUNTER — Other Ambulatory Visit: Payer: Self-pay

## 2022-04-20 ENCOUNTER — Emergency Department (HOSPITAL_COMMUNITY): Payer: BC Managed Care – PPO

## 2022-04-20 ENCOUNTER — Inpatient Hospital Stay (HOSPITAL_COMMUNITY)
Admission: EM | Admit: 2022-04-20 | Discharge: 2022-04-23 | DRG: 603 | Disposition: A | Discharge status: Home / Self Care | Payer: BC Managed Care – PPO | Attending: General Surgery | Admitting: General Surgery

## 2022-04-20 ENCOUNTER — Encounter (HOSPITAL_COMMUNITY): Payer: Self-pay

## 2022-04-20 DIAGNOSIS — F419 Anxiety disorder, unspecified: Secondary | ICD-10-CM | POA: Diagnosis not present

## 2022-04-20 DIAGNOSIS — L03314 Cellulitis of groin: Secondary | ICD-10-CM | POA: Diagnosis not present

## 2022-04-20 DIAGNOSIS — E875 Hyperkalemia: Secondary | ICD-10-CM | POA: Diagnosis present

## 2022-04-20 DIAGNOSIS — Z91199 Patient's noncompliance with other medical treatment and regimen due to unspecified reason: Secondary | ICD-10-CM

## 2022-04-20 DIAGNOSIS — L02214 Cutaneous abscess of groin: Secondary | ICD-10-CM | POA: Diagnosis not present

## 2022-04-20 DIAGNOSIS — R000 Tachycardia, unspecified: Secondary | ICD-10-CM | POA: Diagnosis not present

## 2022-04-20 DIAGNOSIS — Z885 Allergy status to narcotic agent status: Secondary | ICD-10-CM

## 2022-04-20 DIAGNOSIS — Z881 Allergy status to other antibiotic agents status: Secondary | ICD-10-CM

## 2022-04-20 DIAGNOSIS — Z91148 Patient's other noncompliance with medication regimen for other reason: Secondary | ICD-10-CM

## 2022-04-20 DIAGNOSIS — Z79899 Other long term (current) drug therapy: Secondary | ICD-10-CM

## 2022-04-20 DIAGNOSIS — G4733 Obstructive sleep apnea (adult) (pediatric): Secondary | ICD-10-CM | POA: Diagnosis present

## 2022-04-20 DIAGNOSIS — N493 Fournier gangrene: Secondary | ICD-10-CM | POA: Diagnosis not present

## 2022-04-20 DIAGNOSIS — I10 Essential (primary) hypertension: Secondary | ICD-10-CM | POA: Diagnosis present

## 2022-04-20 DIAGNOSIS — B9562 Methicillin resistant Staphylococcus aureus infection as the cause of diseases classified elsewhere: Secondary | ICD-10-CM | POA: Diagnosis not present

## 2022-04-20 DIAGNOSIS — M1990 Unspecified osteoarthritis, unspecified site: Secondary | ICD-10-CM | POA: Diagnosis not present

## 2022-04-20 DIAGNOSIS — Z8614 Personal history of Methicillin resistant Staphylococcus aureus infection: Secondary | ICD-10-CM

## 2022-04-20 DIAGNOSIS — Z87891 Personal history of nicotine dependence: Secondary | ICD-10-CM | POA: Diagnosis not present

## 2022-04-20 DIAGNOSIS — Z89431 Acquired absence of right foot: Secondary | ICD-10-CM

## 2022-04-20 DIAGNOSIS — L0291 Cutaneous abscess, unspecified: Secondary | ICD-10-CM | POA: Diagnosis present

## 2022-04-20 DIAGNOSIS — Z8249 Family history of ischemic heart disease and other diseases of the circulatory system: Secondary | ICD-10-CM | POA: Diagnosis not present

## 2022-04-20 HISTORY — DX: Cutaneous abscess, unspecified: L02.91

## 2022-04-20 HISTORY — PX: IRRIGATION AND DEBRIDEMENT ABSCESS: SHX5252

## 2022-04-20 HISTORY — DX: Cutaneous abscess of groin: L02.214

## 2022-04-20 LAB — CBC WITH DIFFERENTIAL/PLATELET
Abs Immature Granulocytes: 0.12 10*3/uL — ABNORMAL HIGH (ref 0.00–0.07)
Basophils Absolute: 0 10*3/uL (ref 0.0–0.1)
Basophils Relative: 0 %
Eosinophils Absolute: 0.1 10*3/uL (ref 0.0–0.5)
Eosinophils Relative: 1 %
HCT: 41.8 % (ref 39.0–52.0)
Hemoglobin: 13.7 g/dL (ref 13.0–17.0)
Immature Granulocytes: 1 %
Lymphocytes Relative: 3 %
Lymphs Abs: 0.5 10*3/uL — ABNORMAL LOW (ref 0.7–4.0)
MCH: 29.1 pg (ref 26.0–34.0)
MCHC: 32.8 g/dL (ref 30.0–36.0)
MCV: 88.7 fL (ref 80.0–100.0)
Monocytes Absolute: 0.9 10*3/uL (ref 0.1–1.0)
Monocytes Relative: 6 %
Neutro Abs: 13.8 10*3/uL — ABNORMAL HIGH (ref 1.7–7.7)
Neutrophils Relative %: 89 %
Platelets: 251 10*3/uL (ref 150–400)
RBC: 4.71 MIL/uL (ref 4.22–5.81)
RDW: 12.8 % (ref 11.5–15.5)
WBC: 15.4 10*3/uL — ABNORMAL HIGH (ref 4.0–10.5)
nRBC: 0 % (ref 0.0–0.2)

## 2022-04-20 LAB — COMPREHENSIVE METABOLIC PANEL
ALT: 29 U/L (ref 0–44)
AST: 25 U/L (ref 15–41)
Albumin: 3.7 g/dL (ref 3.5–5.0)
Alkaline Phosphatase: 73 U/L (ref 38–126)
Anion gap: 10 (ref 5–15)
BUN: 11 mg/dL (ref 6–20)
CO2: 25 mmol/L (ref 22–32)
Calcium: 8.9 mg/dL (ref 8.9–10.3)
Chloride: 99 mmol/L (ref 98–111)
Creatinine, Ser: 1.04 mg/dL (ref 0.61–1.24)
GFR, Estimated: >60 mL/min (ref >60)
Glucose, Bld: 109 mg/dL — ABNORMAL HIGH (ref 70–99)
Potassium: 3.6 mmol/L (ref 3.5–5.1)
Sodium: 134 mmol/L — ABNORMAL LOW (ref 135–145)
Total Bilirubin: 1.3 mg/dL — ABNORMAL HIGH (ref 0.3–1.2)
Total Protein: 7 g/dL (ref 6.5–8.1)

## 2022-04-20 LAB — HIV ANTIBODY (ROUTINE TESTING W REFLEX): HIV Screen 4th Generation wRfx: NONREACTIVE

## 2022-04-20 LAB — LACTIC ACID, PLASMA
Lactic Acid, Venous: 2.2 mmol/L (ref 0.5–1.9)
Lactic Acid, Venous: 1.9 mmol/L (ref 0.5–1.9)

## 2022-04-20 SURGERY — IRRIGATION AND DEBRIDEMENT ABSCESS
Anesthesia: General | Site: Groin | Laterality: Right

## 2022-04-20 MED ORDER — ONDANSETRON 4 MG PO TBDP: 4.0000 mg | ORAL_TABLET | Freq: Four times a day (QID) | ORAL | Status: DC | PRN

## 2022-04-20 MED ORDER — SODIUM CHLORIDE (PF) 0.9 % IJ SOLN
INTRAMUSCULAR | Status: AC
  Filled 2022-04-20: qty 50

## 2022-04-20 MED ORDER — ACETAMINOPHEN 500 MG PO TABS
1000.0000 mg | ORAL_TABLET | Freq: Once | ORAL | Status: AC
  Administered 2022-04-20: 1000 mg via ORAL
  Filled 2022-04-20: qty 2

## 2022-04-20 MED ORDER — VANCOMYCIN HCL 1500 MG/300ML IV SOLN
1500.0000 mg | Freq: Once | INTRAVENOUS | Status: DC
  Filled 2022-04-20: qty 300

## 2022-04-20 MED ORDER — METRONIDAZOLE 500 MG/100ML IV SOLN
500.0000 mg | Freq: Two times a day (BID) | INTRAVENOUS | Status: DC
  Administered 2022-04-20: 500 mg via INTRAVENOUS
  Filled 2022-04-20: qty 100

## 2022-04-20 MED ORDER — KETOROLAC TROMETHAMINE 30 MG/ML IJ SOLN
30.0000 mg | Freq: Four times a day (QID) | INTRAMUSCULAR | Status: DC
  Administered 2022-04-21 – 2022-04-23 (×10): 30 mg via INTRAVENOUS
  Filled 2022-04-20 (×10): qty 1

## 2022-04-20 MED ORDER — ACETAMINOPHEN 500 MG PO TABS
1000.0000 mg | ORAL_TABLET | Freq: Four times a day (QID) | ORAL | Status: DC
  Administered 2022-04-20 – 2022-04-23 (×10): 1000 mg via ORAL
  Filled 2022-04-20 (×11): qty 2

## 2022-04-20 MED ORDER — DEXAMETHASONE SODIUM PHOSPHATE 10 MG/ML IJ SOLN
INTRAMUSCULAR | Status: DC | PRN
  Administered 2022-04-20: 10 mg via INTRAVENOUS

## 2022-04-20 MED ORDER — PROPOFOL 10 MG/ML IV BOLUS
INTRAVENOUS | Status: AC
  Filled 2022-04-20: qty 20

## 2022-04-20 MED ORDER — DEXMEDETOMIDINE HCL IN NACL 200 MCG/50ML IV SOLN
INTRAVENOUS | Status: DC | PRN
  Administered 2022-04-20: 8 ug via INTRAVENOUS
  Administered 2022-04-20: 12 ug via INTRAVENOUS

## 2022-04-20 MED ORDER — KETOROLAC TROMETHAMINE 30 MG/ML IJ SOLN
INTRAMUSCULAR | Status: DC | PRN
  Administered 2022-04-20: 30 mg via INTRAVENOUS

## 2022-04-20 MED ORDER — AMISULPRIDE (ANTIEMETIC) 5 MG/2ML IV SOLN: 10.0000 mg | Freq: Once | INTRAVENOUS | Status: DC | PRN

## 2022-04-20 MED ORDER — ONDANSETRON HCL 4 MG/2ML IJ SOLN
INTRAMUSCULAR | Status: DC | PRN
  Administered 2022-04-20: 4 mg via INTRAVENOUS

## 2022-04-20 MED ORDER — LACTATED RINGERS IV BOLUS
1000.0000 mL | Freq: Once | INTRAVENOUS | Status: AC
  Administered 2022-04-20: 1000 mL via INTRAVENOUS

## 2022-04-20 MED ORDER — ONDANSETRON HCL 4 MG/2ML IJ SOLN
INTRAMUSCULAR | Status: AC
  Filled 2022-04-20: qty 2

## 2022-04-20 MED ORDER — PROMETHAZINE HCL 25 MG/ML IJ SOLN: 6.2500 mg | INTRAMUSCULAR | Status: DC | PRN

## 2022-04-20 MED ORDER — GABAPENTIN 300 MG PO CAPS: 300.0000 mg | ORAL_CAPSULE | Freq: Every day | ORAL | Status: DC

## 2022-04-20 MED ORDER — CHLORHEXIDINE GLUCONATE 0.12 % MT SOLN
15.0000 mL | Freq: Once | OROMUCOSAL | Status: AC
  Administered 2022-04-20: 15 mL via OROMUCOSAL

## 2022-04-20 MED ORDER — LACTATED RINGERS IV SOLN: INTRAVENOUS | Status: DC

## 2022-04-20 MED ORDER — MIDAZOLAM HCL 5 MG/5ML IJ SOLN
INTRAMUSCULAR | Status: DC | PRN
  Administered 2022-04-20: 2 mg via INTRAVENOUS

## 2022-04-20 MED ORDER — SODIUM CHLORIDE 0.9 % IV SOLN
2.0000 g | Freq: Once | INTRAVENOUS | Status: AC
  Administered 2022-04-20: 2 g via INTRAVENOUS
  Filled 2022-04-20: qty 12.5

## 2022-04-20 MED ORDER — PROPOFOL 10 MG/ML IV BOLUS
INTRAVENOUS | Status: DC | PRN
  Administered 2022-04-20: 50 mg via INTRAVENOUS
  Administered 2022-04-20: 200 mg via INTRAVENOUS

## 2022-04-20 MED ORDER — BUDESONIDE 0.25 MG/2ML IN SUSP
0.2500 mg | Freq: Two times a day (BID) | RESPIRATORY_TRACT | Status: DC
  Administered 2022-04-20 – 2022-04-23 (×6): 0.25 mg via RESPIRATORY_TRACT
  Filled 2022-04-20 (×6): qty 2

## 2022-04-20 MED ORDER — KETOROLAC TROMETHAMINE 30 MG/ML IJ SOLN
INTRAMUSCULAR | Status: AC
  Filled 2022-04-20: qty 1

## 2022-04-20 MED ORDER — METHOCARBAMOL 500 MG PO TABS
500.0000 mg | ORAL_TABLET | Freq: Four times a day (QID) | ORAL | Status: DC | PRN
  Administered 2022-04-20 – 2022-04-21 (×2): 500 mg via ORAL
  Filled 2022-04-20 (×2): qty 1

## 2022-04-20 MED ORDER — ACETAMINOPHEN 10 MG/ML IV SOLN
INTRAVENOUS | Status: DC | PRN
  Administered 2022-04-20: 1000 mg via INTRAVENOUS

## 2022-04-20 MED ORDER — LIDOCAINE HCL (PF) 2 % IJ SOLN
INTRAMUSCULAR | Status: AC
  Filled 2022-04-20: qty 5

## 2022-04-20 MED ORDER — SODIUM CHLORIDE 0.9 % IV SOLN: 2.0000 g | Freq: Three times a day (TID) | INTRAVENOUS | Status: DC

## 2022-04-20 MED ORDER — GABAPENTIN 300 MG PO CAPS
600.0000 mg | ORAL_CAPSULE | Freq: Three times a day (TID) | ORAL | Status: DC
  Administered 2022-04-20 – 2022-04-23 (×8): 600 mg via ORAL
  Filled 2022-04-20 (×8): qty 2

## 2022-04-20 MED ORDER — AMPHETAMINE-DEXTROAMPHETAMINE 10 MG PO TABS
20.0000 mg | ORAL_TABLET | Freq: Two times a day (BID) | ORAL | Status: DC
  Administered 2022-04-21 – 2022-04-23 (×5): 20 mg via ORAL
  Filled 2022-04-20 (×6): qty 2

## 2022-04-20 MED ORDER — FENTANYL CITRATE (PF) 250 MCG/5ML IJ SOLN
INTRAMUSCULAR | Status: AC
  Filled 2022-04-20: qty 5

## 2022-04-20 MED ORDER — MIDAZOLAM HCL 2 MG/2ML IJ SOLN
INTRAMUSCULAR | Status: AC
  Filled 2022-04-20: qty 2

## 2022-04-20 MED ORDER — LINEZOLID 600 MG/300ML IV SOLN
600.0000 mg | Freq: Two times a day (BID) | INTRAVENOUS | Status: DC
  Administered 2022-04-20 – 2022-04-23 (×6): 600 mg via INTRAVENOUS
  Filled 2022-04-20 (×9): qty 300

## 2022-04-20 MED ORDER — ALBUTEROL SULFATE HFA 108 (90 BASE) MCG/ACT IN AERS: 2.0000 | INHALATION_SPRAY | RESPIRATORY_TRACT | Status: DC | PRN

## 2022-04-20 MED ORDER — ENOXAPARIN SODIUM 40 MG/0.4ML IJ SOSY
40.0000 mg | PREFILLED_SYRINGE | INTRAMUSCULAR | Status: DC
  Administered 2022-04-21 – 2022-04-22 (×2): 40 mg via SUBCUTANEOUS
  Filled 2022-04-20 (×3): qty 0.4

## 2022-04-20 MED ORDER — TRAMADOL HCL 50 MG PO TABS: 50.0000 mg | ORAL_TABLET | Freq: Four times a day (QID) | ORAL | Status: DC | PRN

## 2022-04-20 MED ORDER — DIAZEPAM 5 MG PO TABS
5.0000 mg | ORAL_TABLET | Freq: Three times a day (TID) | ORAL | Status: DC | PRN
  Administered 2022-04-20 – 2022-04-22 (×3): 5 mg via ORAL
  Filled 2022-04-20 (×3): qty 1

## 2022-04-20 MED ORDER — NEBIVOLOL HCL 10 MG PO TABS
10.0000 mg | ORAL_TABLET | Freq: Every day | ORAL | Status: DC
  Administered 2022-04-20 – 2022-04-23 (×4): 10 mg via ORAL
  Filled 2022-04-20 (×4): qty 1

## 2022-04-20 MED ORDER — ALBUTEROL SULFATE (2.5 MG/3ML) 0.083% IN NEBU
2.5000 mg | INHALATION_SOLUTION | RESPIRATORY_TRACT | Status: DC | PRN
  Administered 2022-04-22: 2.5 mg via RESPIRATORY_TRACT
  Filled 2022-04-20: qty 3

## 2022-04-20 MED ORDER — DEXAMETHASONE SODIUM PHOSPHATE 10 MG/ML IJ SOLN
INTRAMUSCULAR | Status: AC
  Filled 2022-04-20: qty 1

## 2022-04-20 MED ORDER — METOPROLOL TARTRATE 5 MG/5ML IV SOLN: 5.0000 mg | Freq: Four times a day (QID) | INTRAVENOUS | Status: DC | PRN

## 2022-04-20 MED ORDER — PIPERACILLIN-TAZOBACTAM 3.375 G IVPB
3.3750 g | Freq: Three times a day (TID) | INTRAVENOUS | Status: DC
  Administered 2022-04-20 – 2022-04-23 (×8): 3.375 g via INTRAVENOUS
  Filled 2022-04-20 (×8): qty 50

## 2022-04-20 MED ORDER — ONDANSETRON HCL 4 MG/2ML IJ SOLN: 4.0000 mg | Freq: Four times a day (QID) | INTRAMUSCULAR | Status: DC | PRN

## 2022-04-20 MED ORDER — DEXTROSE-NACL 5-0.9 % IV SOLN: INTRAVENOUS | Status: DC

## 2022-04-20 MED ORDER — SODIUM CHLORIDE 0.9 % IR SOLN
Status: DC | PRN
  Administered 2022-04-20: 1000 mL

## 2022-04-20 SURGICAL SUPPLY — 32 items
BAG COUNTER SPONGE SURGICOUNT (BAG) IMPLANT
BLADE HEX COATED 2.75 (ELECTRODE) ×1 IMPLANT
BLADE SURG SZ10 CARB STEEL (BLADE) ×2 IMPLANT
DERMABOND ADVANCED .7 DNX12 (GAUZE/BANDAGES/DRESSINGS) ×1 IMPLANT
DRAPE LAPAROTOMY T 102X78X121 (DRAPES) IMPLANT
DRAPE LAPAROTOMY TRNSV 102X78 (DRAPES) IMPLANT
DRAPE SHEET LG 3/4 BI-LAMINATE (DRAPES) IMPLANT
ELECT REM PT RETURN 15FT ADLT (MISCELLANEOUS) ×1 IMPLANT
EVACUATOR SILICONE 100CC (DRAIN) IMPLANT
GAUZE PAD ABD 8X10 STRL (GAUZE/BANDAGES/DRESSINGS) IMPLANT
GAUZE SPONGE 4X4 12PLY STRL (GAUZE/BANDAGES/DRESSINGS) ×1 IMPLANT
GLOVE BIO SURGEON STRL SZ7.5 (GLOVE) ×2 IMPLANT
GLOVE BIOGEL PI IND STRL 7.0 (GLOVE) ×1 IMPLANT
GOWN STRL REUS W/ TWL XL LVL3 (GOWN DISPOSABLE) ×2 IMPLANT
GOWN STRL REUS W/TWL XL LVL3 (GOWN DISPOSABLE) ×2
KIT BASIN OR (CUSTOM PROCEDURE TRAY) ×1 IMPLANT
KIT TURNOVER KIT A (KITS) IMPLANT
MARKER SKIN DUAL TIP RULER LAB (MISCELLANEOUS) IMPLANT
NDL HYPO 25X1 1.5 SAFETY (NEEDLE) ×1 IMPLANT
NEEDLE HYPO 25X1 1.5 SAFETY (NEEDLE) IMPLANT
NS IRRIG 1000ML POUR BTL (IV SOLUTION) ×1 IMPLANT
PACK BASIC VI WITH GOWN DISP (CUSTOM PROCEDURE TRAY) ×1 IMPLANT
PENCIL SMOKE EVACUATOR (MISCELLANEOUS) IMPLANT
SOL PREP POV-IOD 4OZ 10% (MISCELLANEOUS) ×1 IMPLANT
SPIKE FLUID TRANSFER (MISCELLANEOUS) IMPLANT
SPONGE T-LAP 4X18 ~~LOC~~+RFID (SPONGE) ×1 IMPLANT
STAPLER VISISTAT 35W (STAPLE) IMPLANT
SUT MNCRL AB 4-0 PS2 18 (SUTURE) IMPLANT
SUT VIC AB 3-0 SH 18 (SUTURE) IMPLANT
SYR CONTROL 10ML LL (SYRINGE) ×1 IMPLANT
TOWEL OR 17X26 10 PK STRL BLUE (TOWEL DISPOSABLE) ×1 IMPLANT
WATER STERILE IRR 1000ML POUR (IV SOLUTION) ×1 IMPLANT

## 2022-04-20 NOTE — ED Provider Notes (Signed)
[facility]-EMERGENCY DEPT Provider Note   CSN: [id number] Arrival date & time: 04/20/22  1236     History  Chief Complaint  Patient presents with   Abscess    George Shelton is a 41 y.o. male.   Abscess    Patient with medical history of remote IV drug use, MRSA infective endocarditis in 2013, osteomyelitis status post transmetatarsal amputation 01/19/2022, 03/15/22 admission to [facility] for OM of tma stump and abscess, since a due to right inguinal abscess x2 days.  Started as a small pimple 2 days ago. Yesterday evening the erythema spread up right inguinal area and to rectum. Endorses swelling and worsening pain.  He is urinating, denies penile discharge, dysuria, or new sexual partners.  He has been unable to have a bowel movement, states the abscess extends into his rectum.  He feels nauseated, not vomiting.  Endorses fevers and chills.  Home Medications Prior to Admission medications   Medication Sig Start Date End Date Taking? Authorizing Provider  albuterol (VENTOLIN HFA) 108 (90 Base) MCG/ACT inhaler Inhale 2 puffs into the lungs every 4 (four) hours as needed for wheezing or shortness of breath. 03/24/22  Yes [provider]  amphetamine-dextroamphetamine (ADDERALL) 20 MG tablet Take 20 mg by mouth 2 (two) times daily.   Yes [provider]  FLOVENT HFA 110 MCG/ACT inhaler Inhale 2 puffs into the lungs 2 (two) times daily. 03/25/22  Yes [provider]  gabapentin (NEURONTIN) 600 MG tablet Take 1 tablet (600 mg total) by mouth 3 (three) times daily. Patient taking differently: Take 600 mg by mouth 2 (two) times daily. 12/01/21  Yes Stover, Titorya, DPM  ibuprofen (ADVIL) 200 MG tablet Take 800 mg by mouth every 6 (six) hours as needed for mild pain, moderate pain or headache.   Yes [provider]  nebivolol (BYSTOLIC) 10 MG tablet Take 1 tablet (10 mg total) by mouth daily. 03/18/22 06/16/22 Yes [place], Eric J, DO  gabapentin  (NEURONTIN) 300 MG capsule Take 1 capsule (300 mg total) by mouth at bedtime. Patient not taking: Reported on 04/20/2022 02/22/22   Louann Sjogren, DPM  ondansetron (ZOFRAN) 4 MG tablet Take 1 tablet (4 mg total) by mouth every 8 (eight) hours as needed for nausea or vomiting. Patient not taking: Reported on 04/20/2022 01/19/22   Louann Sjogren, DPM  traMADol (ULTRAM) 50 MG tablet Take 1 tablet (50 mg total) by mouth every 6 (six) hours as needed for moderate pain. Patient not taking: Reported on 04/20/2022 03/18/22   [place], Eric J, DO      Allergies    Codeine, Other, and Vancomycin    Review of Systems   Review of Systems  Physical Exam Updated Vital Signs BP (!) 146/89   Pulse (!) 103   Temp 98.1 F (36.7 C) (Oral)   Resp 16   Ht 6\' 1"  (1.854 m)   Wt 86.1 kg   SpO2 96%   BMI 25.04 kg/m  Physical Exam Vitals and nursing note reviewed. Exam conducted with a chaperone present.  Constitutional:      Appearance: Normal appearance. He is ill-appearing.  HENT:     Head: Normocephalic and atraumatic.  Eyes:     General: No scleral icterus.       Right eye: No discharge.        Left eye: No discharge.     Extraocular Movements: Extraocular movements intact.     Pupils: Pupils are equal, round, and reactive to light.  Cardiovascular:     Rate and Rhythm: Regular rhythm. Tachycardia present.     Pulses: Normal pulses.     Heart sounds: Normal heart sounds. No murmur heard.    No friction rub. No gallop.  Pulmonary:     Effort: Pulmonary effort is normal. No respiratory distress.     Breath sounds: Normal breath sounds.  Abdominal:     General: Abdomen is flat. Bowel sounds are normal. There is no distension.     Palpations: Abdomen is soft.     Tenderness: There is no abdominal tenderness.  Genitourinary:    Comments: Indurated area to right inguinal area with purulent drainage.  Erythema extends to the groin to the rectum, see photo Skin:    General: Skin is warm and dry.      Capillary Refill: Capillary refill takes less than 2 seconds.     Coloration: Skin is not jaundiced.     Findings: Erythema present.  Neurological:     Mental Status: He is alert. Mental status is at baseline.     Coordination: Coordination normal.      ED Results / Procedures / Treatments   Labs (all labs ordered are listed, but only abnormal results are displayed) Labs Reviewed  CBC WITH DIFFERENTIAL/PLATELET - Abnormal; Notable for the following components:      Result Value   WBC 15.4 (*)    Neutro Abs 13.8 (*)    Lymphs Abs 0.5 (*)    Abs Immature Granulocytes 0.12 (*)    All other components within normal limits  COMPREHENSIVE METABOLIC PANEL - Abnormal; Notable for the following components:   Sodium 134 (*)    Glucose, Bld 109 (*)    Total Bilirubin 1.3 (*)    All other components within normal limits  LACTIC ACID, PLASMA - Abnormal; Notable for the following components:   Lactic Acid, Venous 2.2 (*)    All other components within normal limits  CULTURE, BLOOD (ROUTINE X 2)  CULTURE, BLOOD (ROUTINE X 2)  LACTIC ACID, PLASMA  HIV ANTIBODY (ROUTINE TESTING W REFLEX)  URINALYSIS, ROUTINE W REFLEX MICROSCOPIC  PROTIME-INR  APTT  RAPID URINE DRUG SCREEN, HOSP PERFORMED  GC/CHLAMYDIA PROBE AMP ([place]) NOT AT [facility]    EKG None  Radiology No results found.  Procedures .Critical Care  Performed by: Sherrill Raring, PA-C Authorized by: Sherrill Raring, PA-C   Critical care provider statement:    Critical care time (minutes):  35   Critical care start time:  04/20/2022 2:20 PM   Critical care end time:  04/20/2022 2:55 PM   Critical care was necessary to treat or prevent imminent or life-threatening deterioration of the following conditions:  Sepsis (fourniers gangrene)   Critical care was time spent personally by me on the following activities:  Development of treatment plan with patient or surrogate, discussions with consultants, evaluation of patient's response  to treatment, examination of patient, ordering and review of laboratory studies, ordering and review of radiographic studies, ordering and performing treatments and interventions, pulse oximetry, re-evaluation of patient's condition and review of old charts   Care discussed with: admitting provider       Medications Ordered in ED Medications  metroNIDAZOLE (FLAGYL) IVPB 500 mg ( Intravenous Automatically Held 04/28/22 1400)  linezolid (ZYVOX) IVPB 600 mg ( Intravenous Automatically Held 04/28/22 1500)  lactated ringers infusion (has no administration in time range)  lactated ringers infusion ( Intravenous New Bag/Given 04/20/22 1638)  chlorhexidine (PERIDEX) 0.12 % solution 15 mL (  has no administration in time range)  acetaminophen (TYLENOL) tablet 1,000 mg (1,000 mg Oral Given 04/20/22 1418)  ceFEPIme (MAXIPIME) 2 g in sodium chloride 0.9 % 100 mL IVPB (0 g Intravenous Stopped 04/20/22 1507)  lactated ringers bolus 1,000 mL (1,000 mLs Intravenous New Bag/Given 04/20/22 1408)  sodium chloride (PF) 0.9 % injection (  Given 04/20/22 1557)    ED Course/ Medical Decision Making/ A&P Clinical Course as of 04/20/22 1638  Tue Apr 20, 2022  1353 This is an ex IV drug user who presented with rapidly developing erythema warmth and tenderness in his groin region who  I have personally seen and evaluated.  He has erythema and tenderness all along his right perineal region and groin extending to right buttock, active purulent drainage no crepitus.  His presentation is concerning for sepsis and Fourniers.  We are covering with vancomycin, cefepime and Flagyl.  Plan to reach out to surgery team for evaluation. [VB]  1403 Spoke with PA Brooke with general surgery, given patient is stable they would advise CT scan and they will come evaluate the patient. [HS]  1404 I called CT, they will take him back to CT next.  Medical urgency outweighs waiting for Cr. Reviewed charts, his creatinine was 1.3 a week ago. [HS]   1425 Surgery PA has evaluated the patient, no need for CT scan as they will proceed to take the patient to the OR.  Appreciate their consult  [HS]    Clinical Course User Index [HS] Theron Arista, PA-C [VB] Mardene Sayer, MD                           Medical Decision Making Amount and/or Complexity of Data Reviewed Labs: ordered. Radiology: ordered.  Risk OTC drugs. Prescription drug management. Decision regarding hospitalization.   Patient presents due to genital abscess per differential includes not limited to Fournier's gangrene, scrotal abscess, sepsis.  Patient is acutely ill on exam, he is tachycardic, febrile with physical exam findings concerning for Fournier's gangrene. -BP 139/70   Pulse (!) 112   Temp (!) 101.2 F (38.4 C) (Oral)   Resp 16   Ht 6\' 1"  (1.854 m)   Wt 86.2 kg   SpO2 100%   BMI 25.07 kg/m   Reviewed patient's past medical records external chart review including infectious disease note from later this week. See HPI.   I ordered, viewed and interpreted laboratory work-up. -CBC with leukocytosis of 15.4 with left shift. -CMP without gross electrolyte derangement or transaminitis. -Lactic acid elevated at 2.2.  I ordered 2 g cefepime, 500 mg of Flagyl, vancomycin 1500 IV.  I reviewed patient's home medication list.  Patient is on cardiac monitoring, EKG shows sinus tachycardia.  Please see ED course for consults.  Patient will be going to the OR with surgery team for management of Fournier's gangrene.  Discussed HPI, physical exam and plan of care for this patient with attending . The attending physician evaluated this patient as part of a shared visit and agrees with plan of care.         Final Clinical Impression(s) / ED Diagnoses Final diagnoses:  Fournier gangrene    Rx / DC Orders ED Discharge Orders     None         Vivien Rossetti, Theron Arista 04/20/22 1638    04/22/22, MD 04/20/22 [phone]

## 2022-04-20 NOTE — Anesthesia Preprocedure Evaluation (Addendum)
Anesthesia Evaluation  Patient identified by MRN, date of birth, ID band Patient awake    Reviewed: Allergy & Precautions, NPO status , Patient's Chart, lab work & pertinent test results  Airway Mallampati: II  TM Distance: >3 FB Neck ROM: Full    Dental  (+) Dental Advisory Given, Chipped   Pulmonary neg pulmonary ROS, former smoker,    Pulmonary exam normal breath sounds clear to auscultation       Cardiovascular hypertension, Pt. on home beta blockers + angina Normal cardiovascular exam Rhythm:Regular Rate:Normal  Stress/MPS 07/2021 .  Findings are consistent with no ischemia and no prior myocardial infarction. The study is low risk. .  No ST deviation was noted. .  Left ventricular function is normal. Nuclear stress EF: 59 %. The left ventricular ejection fraction is normal (55-65%). End diastolic cavity size is normal. .  Prior study not available for comparison. .  Diaphragmatic attenuation noted.   Echo 05/2021 Normal LVSF    Neuro/Psych negative neurological ROS     GI/Hepatic negative GI ROS, (+)     substance abuse  alcohol use, Hepatitis -, C  Endo/Other  negative endocrine ROS  Renal/GU negative Renal ROS     Musculoskeletal  (+) Arthritis ,   Abdominal   Peds  Hematology negative hematology ROS (+)   Anesthesia Other Findings   Reproductive/Obstetrics                             Anesthesia Physical  Anesthesia Plan  ASA: 3 and emergent  Anesthesia Plan: General   Post-op Pain Management: Tylenol PO (pre-op)* and Toradol IV (intra-op)*   Induction: Intravenous, Rapid sequence and Cricoid pressure planned  PONV Risk Score and Plan: 2 and Ondansetron, Dexamethasone, Treatment may vary due to age or medical condition and Midazolam  Airway Management Planned: Oral ETT  Additional Equipment:   Intra-op Plan:   Post-operative Plan: Extubation in OR  Informed  Consent: I have reviewed the patients History and Physical, chart, labs and discussed the procedure including the risks, benefits and alternatives for the proposed anesthesia with the patient or authorized representative who has indicated his/her understanding and acceptance.     Dental advisory given  Plan Discussed with: CRNA  Anesthesia Plan Comments:        Anesthesia Quick Evaluation

## 2022-04-20 NOTE — ED Triage Notes (Signed)
Patient states that he has an abscess to the right groin x 2 days. Patient states the abscess extends from the right groin to his right buttock.

## 2022-04-20 NOTE — Op Note (Signed)
04/20/2022  7:57 PM  PATIENT:  George Shelton  41 y.o. male  PRE-OPERATIVE DIAGNOSIS:  Right groin abscess  POST-OPERATIVE DIAGNOSIS:  Right groin abscess  PROCEDURE:  Procedure(s): IRRIGATION AND DEBRIDEMENT groin ABSCESS (Right) 25x3cm incision  SURGEON:  Surgeon(s) and Role:    Ralene Ok, MD - Primary  ANESTHESIA:   general  EBL:  25cc   BLOOD ADMINISTERED:none  DRAINS: saline soaked kerlix in the wound   LOCAL MEDICATIONS USED:  NONE  SPECIMEN:  Source of Specimen:  r groin abscess   DISPOSITION OF SPECIMEN:  micro  COUNTS:  YES  TOURNIQUET:  * No tourniquets in log *  DICTATION: .Dragon Dictation  Indication procedure: Patient is a 41 year old male, with a history of MRSA who comes in secondary to a right inguinal abscess.  Patient was seen in the ER was scheduled for I&D of abscess.  Findings: Patient had a tubular like abscess in the right inguinal crease.  There is tract from the right ASIS area towards the right buttock.  This was opened up in its entirety.  Large amount of purulence was expressed.  The area was then packed with saline soaked Kerlix.  Details of procedure: After the patient was consented he was taken back to the OR and placed in supine position with bilateral SCDs in place.  He underwent general endotracheal intubation.  Patient was then prepped and draped in standard fashion.  A timeout was called all facts were verified.  At this time a #10 blade was used to make an incision in the area of greatest fluctuance.  Dissection was taken down to the abscess cavity.  Large amount of purulence was expressed.  Both aerobic and anaerobic cultures were obtained.  At this time using digital dissection the tract lead superior and inferior towards the buttocks.  All loculations were broken up.  All pus was drained.  There was then irrigated out with sterile saline.  Cautery was used to maintain hemostasis.  At this time saline soaked Kerlix was  then placed into the wound.  The wound was then dressed with ABD pad and mesh panties.  Patient tolerated procedure well was taken to the recovery in stable condition.  PLAN OF CARE: Admit to inpatient   PATIENT DISPOSITION:  PACU - hemodynamically stable.   Delay start of Pharmacological VTE agent (>24hrs) due to surgical blood loss or risk of bleeding: yes

## 2022-04-20 NOTE — Anesthesia Procedure Notes (Signed)
Procedure Name: LMA Insertion Date/Time: 04/20/2022 7:38 PM  Performed by: [place] Manus Rudd, CRNAPre-anesthesia Checklist: Patient identified, Emergency Drugs available, Suction available and Patient being monitored Patient Re-evaluated:Patient Re-evaluated prior to induction Oxygen Delivery Method: Circle system utilized Preoxygenation: Pre-oxygenation with 100% oxygen Induction Type: IV induction Ventilation: Mask ventilation without difficulty LMA: LMA inserted LMA Size: 4.0 Number of attempts: 1 Airway Equipment and Method: Bite block Placement Confirmation: positive ETCO2 Tube secured with: Tape Dental Injury: Teeth and Oropharynx as per pre-operative assessment

## 2022-04-20 NOTE — Progress Notes (Signed)
A consult was received from an ED physician for Vancomycin per pharmacy dosing.  The patient's profile has been reviewed for ht/wt/allergies/indication/available labs.    Noted vancomycin  allergy "It kills my blood cells"  9/13 was most recent CBC: WBC, Hgb, Plt all WNL Discussed with EDP Dr. Nechama Guard, ok to proceed with vancomycin today.  Will continue to monitor CBC.    A one time order has been placed for Vancomycin 1500mg .   Further antibiotics/pharmacy consults should be ordered by admitting physician if indicated.                       Thank you,  Gretta Arab PharmD, BCPS Clinical Pharmacist [facility] main pharmacy [phone] 04/20/2022 1:55 PM

## 2022-04-20 NOTE — Transfer of Care (Signed)
Immediate Anesthesia Transfer of Care Note  Patient: George Shelton  Procedure(s) Performed: IRRIGATION AND DEBRIDEMENT groinABSCESS (Right: Groin)  Patient Location: PACU  Anesthesia Type:General  Level of Consciousness: awake, alert  and oriented  Airway & Oxygen Therapy: Patient Spontanous Breathing and Patient connected to face mask oxygen  Post-op Assessment: Report given to RN and Post -op Vital signs reviewed and stable  Post vital signs: Reviewed and stable  Last Vitals:  Vitals Value Taken Time  BP 106/48 04/20/22 2007  Temp    Pulse 99 04/20/22 2009  Resp 17 04/20/22 2009  SpO2 100 % 04/20/22 2009  Vitals shown include unvalidated device data.  Last Pain:  Vitals:   04/20/22 1632  TempSrc: Oral  PainSc: 3          Complications: No notable events documented.

## 2022-04-20 NOTE — Anesthesia Postprocedure Evaluation (Signed)
Anesthesia Post Note  Patient: Saunders [place]  Procedure(s) Performed: IRRIGATION AND DEBRIDEMENT groinABSCESS (Right: Groin)     Patient location during evaluation: PACU Anesthesia Type: General Level of consciousness: awake and alert Pain management: pain level controlled Vital Signs Assessment: post-procedure vital signs reviewed and stable Respiratory status: spontaneous breathing, nonlabored ventilation and respiratory function stable Cardiovascular status: blood pressure returned to baseline and stable Postop Assessment: no apparent nausea or vomiting Anesthetic complications: no   No notable events documented.  Last Vitals:  Vitals:   04/20/22 2015 04/20/22 2031  BP: 116/62 121/72  Pulse: 96 99  Resp: 17 19  Temp:  37.3 C  SpO2: 100% 100%    Last Pain:  Vitals:   04/20/22 2015  TempSrc:   PainSc: Asleep                 Lynda Rainwater

## 2022-04-20 NOTE — H&P (Signed)
George Shelton 25-Nov-1980  [id number].    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  He states he popped a small pimple in his right groin 3 days ago.  Yesterday he noted mild right groin soreness which progressed to severe pain, redness, and warmth today.  Associated symptoms include fever and tachycardia.  He denies dysuria, hematuria, or other urinary symptoms.  Denies blood in his stool.  He lives at home with his wife and daughter.  He currently works 2 jobs.  States he quit smoking in June 2023.  Reports using Adderall but no other drugs including marijuana, cocaine, or opioid medications.  Reports a drug allergy to vancomycin, states it attacks his blood cells and almost killed him during a previous hospital stay.   His cardiologist is Dr. Dulce Sellar, who saw the patient in March 2023 for chest pain.  He had a normal myocardial perfusion study with no signs of ischemia.  His doctor continued him on his beta-blocker, which he states he is not taking every single day as prescribed.  ROS: As above Review of Systems  All other systems reviewed and are negative.   Family History  Problem Relation Age of Onset   Heart attack Father    Hypertension Father    Diabetes Father    Melanoma Father    Sleep apnea Father    Hypertension Maternal Grandmother    Atrial fibrillation Maternal Grandmother    Hypertension Maternal Grandfather    Pancreatic cancer Paternal Grandmother     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[facility])     HSV-1 (herpes simplex virus 1) infection    lip ulcer per Pt   HSV-2 (herpes simplex virus 2) infection    Left atrial enlargement    MRSA (methicillin resistant staph aureus) culture positive    Nicotine dependence    quit July 6,2023   Polysubstance abuse (HCC)    Sepsis (HCC)     Past Surgical History:  Procedure Laterality Date   TOE AMPUTATION     Big Toe   TRANSMETATARSAL AMPUTATION Right 03/16/2022   Procedure: TRANSMETATARSAL AMPUTATION/I&D RIGHT FOOT/BONE BIOPSY;  Surgeon: Vivi Barrack, DPM;  Location: [facility];  Service: Podiatry;  Laterality: Right;  patient would like a block    Social History:  reports that he quit smoking about 4 months ago. His smoking use included cigarettes. He smoked an average of 1.5 packs per day. He has quit using smokeless tobacco.  His smokeless tobacco use included chew. He reports that he does not currently use alcohol. He reports that he does not use drugs.  Allergies:  Allergies  Allergen Reactions   Codeine    Other     History of drug addiction. Does not want pain medication of any kind. Sober/clean for 8 1/2 years.   Patient states NO OPIATES   Vancomycin     "It kills my blood cells"    (Not in a hospital admission)    Physical Exam: Blood pressure (!) 140/76, pulse (!) 109, temperature (!) 101.2 F (38.4 C), temperature source  Oral, resp. rate 19, height 6\' 1"  (1.854 m), weight 86.2 kg, SpO2 100 %. General: white male  laying on hospital bed, appears stated age, NAD, appears anxious.  HEENT: head -normocephalic, atraumatic; Eyes: PERRLA, no conjunctival injection Neck- Trachea is midline, no thyromegaly or JVD appreciated.  CV-sinus tachycardia, no murmurs rubs or gallops, no lower extremity edema Pulm- breathing is non-labored on room air Abd- soft, NT/ND, appropriate bowel sounds in 4 quadrants, no masses, hernias, or organomegaly. GU- there is redness of the right inguinal region with a small carbuncle of the  right groin, just lateral to the scrotum.  There is roughly 3x20 cm of induration that tracks anteriorly as well as posteriorly towards the right buttock.  His scrotum is erythematous but it is soft and not indurated.  Normal penile anatomy.  MSK-upper extremities are symmetrical, no cyanosis; right foot status post transmetatarsal amputation with mild edema but no cellulitis no drainage.  There are paresthesias of the right foot Neuro-on focal exam, not assessed  Psych- Alert and Oriented x3  Skin: warm and dry, no rashes or lesions   Results for orders placed or performed during the hospital encounter of 04/20/22 (from the past 48 hour(s))  CBC with Differential     Status: Abnormal   Collection Time: 04/20/22  1:45 PM  Result Value Ref Range   WBC 15.4 (H) 4.0 - 10.5 K/uL   RBC 4.71 4.22 - 5.81 MIL/uL   Hemoglobin 13.7 13.0 - 17.0 g/dL   HCT 41.8 39.0 - 52.0 %   MCV 88.7 80.0 - 100.0 fL   MCH 29.1 26.0 - 34.0 pg   MCHC 32.8 30.0 - 36.0 g/dL   RDW 12.8 11.5 - 15.5 %   Platelets 251 150 - 400 K/uL   nRBC 0.0 0.0 - 0.2 %   Neutrophils Relative % 89 %   Neutro Abs 13.8 (H) 1.7 - 7.7 K/uL   Lymphocytes Relative 3 %   Lymphs Abs 0.5 (L) 0.7 - 4.0 K/uL   Monocytes Relative 6 %   Monocytes Absolute 0.9 0.1 - 1.0 K/uL   Eosinophils Relative 1 %   Eosinophils Absolute 0.1 0.0 - 0.5 K/uL   Basophils Relative 0 %   Basophils Absolute 0.0 0.0 - 0.1 K/uL   Immature Granulocytes 1 %   Abs Immature Granulocytes 0.12 (H) 0.00 - 0.07 K/uL    Comment: Performed at [facility], [place] [address], [place], [place] [address]   No results found.    Assessment/Plan Right groin abscess, cannot exclude Fournier's gangrene -Tmax 101.2.  Heart rate 109-128, normal blood pressure, WBC 15.4 -Some tachycardia appears to be baseline for this patient and has been attributed to anxiety and hypertension in the past which is why he remains on a beta-blocker as prescribed by his  cardiologist, but I suspect this is mostly reactive to acute infection.  He has no obvious external signs of tissue necrosis on physical exam -- there is no crepitus of the skin or visible tissue necrosis, however given rapidly progressive infection that was reported by the patient with significant increase in redness, pain, and induration in less than 24 hours cannot exclude a necrotizing soft tissue infection.  Recommend n.p.o., IV fluid resuscitation, IV linezolid per pharmacy protocol for Fournier's gangrene, and operative intervention today with irrigation and debridement of the right groin.  Discussed the risks of surgery with the patient including bleeding, infection, damage to surrounding structures, need for additional surgeries, prolonged hospital stay, as  well as the risks of general anesthesia.  The patient expresses understanding and wishes to proceed with surgery.   FEN -NPO, IVF VTE -SCDs, hold chemical VTE for now due to plans for surgery ID -linezolid, given history of recurrent MRSA infections, osteomyelitis, may end up needing pharmacy involvement for guidance with antibiotics but will hold off for now. Admit -[facility] service  Essential hypertension -- home meds reordered, as needed IV metoprolol OSA --noncompliant with CPAP, will encourage compliance Polysubstance abuse --patient states he has not used IV drugs for over 8 years History of frequent MRSA infections, including infective endocarditis in 2013, right great toe osteomyelitis status post transmetatarsal amputation 12/2021 followed outpatient by infectious disease.  No signs of dehiscence of the surgical site.  No redness or cellulitis   I reviewed nursing notes, ED provider notes, last 24 h vitals and pain scores, last 48 h intake and output, last 24 h labs and trends, and last 24 h imaging results.  Adam Phenix, [facility] 04/20/2022, 2:51 PM Please see Amion for pager number during day hours  7:00am-4:30pm or 7:00am -11:30am on weekends

## 2022-04-21 ENCOUNTER — Telehealth: Payer: Self-pay | Admitting: Podiatry

## 2022-04-21 ENCOUNTER — Encounter: Payer: BC Managed Care – PPO | Admitting: Podiatry

## 2022-04-21 ENCOUNTER — Encounter (HOSPITAL_COMMUNITY): Payer: Self-pay | Admitting: General Surgery

## 2022-04-21 ENCOUNTER — Other Ambulatory Visit: Payer: Self-pay

## 2022-04-21 DIAGNOSIS — Z8614 Personal history of Methicillin resistant Staphylococcus aureus infection: Secondary | ICD-10-CM | POA: Diagnosis not present

## 2022-04-21 DIAGNOSIS — Z87891 Personal history of nicotine dependence: Secondary | ICD-10-CM | POA: Diagnosis not present

## 2022-04-21 DIAGNOSIS — Z89431 Acquired absence of right foot: Secondary | ICD-10-CM | POA: Diagnosis not present

## 2022-04-21 DIAGNOSIS — Z91148 Patient's other noncompliance with medication regimen for other reason: Secondary | ICD-10-CM | POA: Diagnosis not present

## 2022-04-21 DIAGNOSIS — G4733 Obstructive sleep apnea (adult) (pediatric): Secondary | ICD-10-CM | POA: Diagnosis present

## 2022-04-21 DIAGNOSIS — Z8249 Family history of ischemic heart disease and other diseases of the circulatory system: Secondary | ICD-10-CM | POA: Diagnosis not present

## 2022-04-21 DIAGNOSIS — L02214 Cutaneous abscess of groin: Secondary | ICD-10-CM | POA: Diagnosis present

## 2022-04-21 DIAGNOSIS — Z885 Allergy status to narcotic agent status: Secondary | ICD-10-CM | POA: Diagnosis not present

## 2022-04-21 DIAGNOSIS — Z881 Allergy status to other antibiotic agents status: Secondary | ICD-10-CM | POA: Diagnosis not present

## 2022-04-21 DIAGNOSIS — Z91199 Patient's noncompliance with other medical treatment and regimen due to unspecified reason: Secondary | ICD-10-CM | POA: Diagnosis not present

## 2022-04-21 DIAGNOSIS — E875 Hyperkalemia: Secondary | ICD-10-CM | POA: Diagnosis present

## 2022-04-21 DIAGNOSIS — F419 Anxiety disorder, unspecified: Secondary | ICD-10-CM | POA: Diagnosis present

## 2022-04-21 DIAGNOSIS — Z79899 Other long term (current) drug therapy: Secondary | ICD-10-CM | POA: Diagnosis not present

## 2022-04-21 DIAGNOSIS — L0291 Cutaneous abscess, unspecified: Secondary | ICD-10-CM | POA: Diagnosis not present

## 2022-04-21 DIAGNOSIS — I10 Essential (primary) hypertension: Secondary | ICD-10-CM | POA: Diagnosis present

## 2022-04-21 DIAGNOSIS — B9562 Methicillin resistant Staphylococcus aureus infection as the cause of diseases classified elsewhere: Secondary | ICD-10-CM | POA: Diagnosis present

## 2022-04-21 DIAGNOSIS — N493 Fournier gangrene: Secondary | ICD-10-CM | POA: Diagnosis present

## 2022-04-21 LAB — URINALYSIS, ROUTINE W REFLEX MICROSCOPIC
Bacteria, UA: NONE SEEN
Bilirubin Urine: NEGATIVE
Glucose, UA: >=500 mg/dL — AB
Hgb urine dipstick: NEGATIVE
Ketones, ur: NEGATIVE mg/dL
Leukocytes,Ua: NEGATIVE
Nitrite: NEGATIVE
Protein, ur: NEGATIVE mg/dL
Specific Gravity, Urine: 1 — ABNORMAL LOW (ref 1.005–1.030)
pH: 7 (ref 5.0–8.0)

## 2022-04-21 LAB — BASIC METABOLIC PANEL
Anion gap: 7 (ref 5–15)
BUN: 12 mg/dL (ref 6–20)
CO2: 25 mmol/L (ref 22–32)
Calcium: 9.1 mg/dL (ref 8.9–10.3)
Chloride: 107 mmol/L (ref 98–111)
Creatinine, Ser: 1.08 mg/dL (ref 0.61–1.24)
GFR, Estimated: >60 mL/min (ref >60)
Glucose, Bld: 189 mg/dL — ABNORMAL HIGH (ref 70–99)
Potassium: 5.5 mmol/L — ABNORMAL HIGH (ref 3.5–5.1)
Sodium: 139 mmol/L (ref 135–145)

## 2022-04-21 LAB — CBC
HCT: 45 % (ref 39.0–52.0)
Hemoglobin: 14.5 g/dL (ref 13.0–17.0)
MCH: 29.6 pg (ref 26.0–34.0)
MCHC: 32.2 g/dL (ref 30.0–36.0)
MCV: 91.8 fL (ref 80.0–100.0)
Platelets: 237 10*3/uL (ref 150–400)
RBC: 4.9 MIL/uL (ref 4.22–5.81)
RDW: 12.8 % (ref 11.5–15.5)
WBC: 20 10*3/uL — ABNORMAL HIGH (ref 4.0–10.5)
nRBC: 0 % (ref 0.0–0.2)

## 2022-04-21 LAB — RAPID URINE DRUG SCREEN, HOSP PERFORMED
Amphetamines: POSITIVE — AB
Barbiturates: NOT DETECTED
Benzodiazepines: POSITIVE — AB
Cocaine: NOT DETECTED
Opiates: NOT DETECTED
Tetrahydrocannabinol: NOT DETECTED

## 2022-04-21 LAB — HEMOGLOBIN A1C
Hgb A1c MFr Bld: 5 % (ref 4.8–5.6)
Mean Plasma Glucose: 96.8 mg/dL

## 2022-04-21 LAB — PROTIME-INR
INR: 1 (ref 0.8–1.2)
Prothrombin Time: 13.2 seconds (ref 11.4–15.2)

## 2022-04-21 LAB — APTT: aPTT: 34 seconds (ref 24–36)

## 2022-04-21 NOTE — Telephone Encounter (Signed)
I will see if I can come by and see him later today. Usually we do have to be consulted but will let them know. Thanks

## 2022-04-21 NOTE — Plan of Care (Signed)
  Problem: Clinical Measurements: Goal: Ability to maintain clinical measurements within normal limits will improve Outcome: Progressing   

## 2022-04-21 NOTE — Telephone Encounter (Signed)
Pt called and canceled his appt for today as he is in the hospital and would like for someone to come see him at the hospital as he is still having a lot of bruising and pain around right ankle. He is concerned about infection.  He said you are the best. I told pt I was not sure if our providers could just come examine pts at the hospital without being consulted.

## 2022-04-21 NOTE — Progress Notes (Signed)
1 Day Post-Op   Chief Complaint/Subjective: Feeling much better, some seep through on dressing and bed  Objective: Vital signs in last 24 hours: Temp:  [97.6 F (36.4 C)-101.2 F (38.4 C)] 97.6 F (36.4 C) (09/20 0515) Pulse Rate:  [78-128] 78 (09/20 0515) Resp:  [16-22] 18 (09/20 0515) BP: (106-156)/(48-89) 117/72 (09/20 0515) SpO2:  [96 %-100 %] 99 % (09/20 0753) Weight:  [86.1 kg-86.2 kg] 86.1 kg (09/19 1632) Last BM Date : 04/19/22 Intake/Output from previous day: 09/19 0701 - 09/20 0700 In: 1619 [P.O.:360; I.V.:720.3; IV Piggyback:538.7] Out: 1500 [Urine:1500] Intake/Output this shift: No intake/output data recorded.  PE: Gen: NAD Resp: nonlabored Card: RRR Abd: soft, Right groin incision bandaged, no erythema  Lab Results:  Recent Labs    04/20/22 1345 04/21/22 0432  WBC 15.4* 20.0*  HGB 13.7 14.5  HCT 41.8 45.0  PLT 251 237   BMET Recent Labs    04/20/22 1345 04/21/22 0432  NA 134* 139  K 3.6 5.5*  CL 99 107  CO2 25 25  GLUCOSE 109* 189*  BUN 11 12  CREATININE 1.04 1.08  CALCIUM 8.9 9.1   PT/INR Recent Labs    04/20/22 2235  LABPROT 13.2  INR 1.0   CMP     Component Value Date/Time   NA 139 04/21/2022 0432   K 5.5 (H) 04/21/2022 0432   CL 107 04/21/2022 0432   CO2 25 04/21/2022 0432   GLUCOSE 189 (H) 04/21/2022 0432   BUN 12 04/21/2022 0432   CREATININE 1.08 04/21/2022 0432   CREATININE 1.30 (H) 04/14/2022 0427   CALCIUM 9.1 04/21/2022 0432   PROT 7.0 04/20/2022 1345   ALBUMIN 3.7 04/20/2022 1345   AST 25 04/20/2022 1345   ALT 29 04/20/2022 1345   ALKPHOS 73 04/20/2022 1345   BILITOT 1.3 (H) 04/20/2022 1345   GFRNONAA >60 04/21/2022 0432   Lipase  No results found for: "LIPASE"  Studies/Results: No results found.  Anti-infectives: Anti-infectives (From admission, onward)    Start     Dose/Rate Route Frequency Ordered Stop   04/20/22 2200  ceFEPIme (MAXIPIME) 2 g in sodium chloride 0.9 % 100 mL IVPB  Status:   Discontinued        2 g 200 mL/hr over 30 Minutes Intravenous Every 8 hours 04/20/22 1708 04/20/22 2056   04/20/22 2200  piperacillin-tazobactam (ZOSYN) IVPB 3.375 g        3.375 g 12.5 mL/hr over 240 Minutes Intravenous Every 8 hours 04/20/22 2028 04/25/22 2159   04/20/22 1500  linezolid (ZYVOX) IVPB 600 mg        600 mg 300 mL/hr over 60 Minutes Intravenous Every 12 hours 04/20/22 1446     04/20/22 1415  vancomycin (VANCOREADY) IVPB 1500 mg/300 mL  Status:  Discontinued        1,500 mg 150 mL/hr over 120 Minutes Intravenous  Once 04/20/22 1411 04/20/22 1453   04/20/22 1400  ceFEPIme (MAXIPIME) 2 g in sodium chloride 0.9 % 100 mL IVPB        2 g 200 mL/hr over 30 Minutes Intravenous  Once 04/20/22 1348 04/20/22 1507   04/20/22 1400  metroNIDAZOLE (FLAGYL) IVPB 500 mg  Status:  Discontinued        500 mg 100 mL/hr over 60 Minutes Intravenous Every 12 hours 04/20/22 1349 04/20/22 2056       Assessment/Plan  s/p Procedure(s): IRRIGATION AND DEBRIDEMENT groinABSCESS 04/20/2022    FEN - reg diet VTE - lovenox ID - zosyn,  zyvox Disposition - change dressing tomorrow, reinforce as needed today   LOS: 1 day   I reviewed last 24 h vitals and pain scores, last 48 h intake and output, last 24 h labs and trends, and last 24 h imaging results.  This care required high  level of medical decision making.   De Blanch [facility] Surgery 04/21/2022, 8:15 AM Please see Amion for pager number during day hours 7:00am-4:30pm or 7:00am -11:30am on weekends

## 2022-04-22 DIAGNOSIS — L0291 Cutaneous abscess, unspecified: Secondary | ICD-10-CM | POA: Diagnosis not present

## 2022-04-22 LAB — CBC
HCT: 37.1 % — ABNORMAL LOW (ref 39.0–52.0)
Hemoglobin: 12.1 g/dL — ABNORMAL LOW (ref 13.0–17.0)
MCH: 29.8 pg (ref 26.0–34.0)
MCHC: 32.6 g/dL (ref 30.0–36.0)
MCV: 91.4 fL (ref 80.0–100.0)
Platelets: 255 10*3/uL (ref 150–400)
RBC: 4.06 MIL/uL — ABNORMAL LOW (ref 4.22–5.81)
RDW: 12.8 % (ref 11.5–15.5)
WBC: 19.8 10*3/uL — ABNORMAL HIGH (ref 4.0–10.5)
nRBC: 0 % (ref 0.0–0.2)

## 2022-04-22 LAB — BASIC METABOLIC PANEL
Anion gap: 6 (ref 5–15)
BUN: 13 mg/dL (ref 6–20)
CO2: 23 mmol/L (ref 22–32)
Calcium: 8.4 mg/dL — ABNORMAL LOW (ref 8.9–10.3)
Chloride: 110 mmol/L (ref 98–111)
Creatinine, Ser: 0.98 mg/dL (ref 0.61–1.24)
GFR, Estimated: >60 mL/min (ref >60)
Glucose, Bld: 139 mg/dL — ABNORMAL HIGH (ref 70–99)
Potassium: 3.6 mmol/L (ref 3.5–5.1)
Sodium: 139 mmol/L (ref 135–145)

## 2022-04-22 LAB — GC/CHLAMYDIA PROBE AMP (~~LOC~~) NOT AT ARMC
Chlamydia: NEGATIVE
Comment: NEGATIVE
Comment: NORMAL
Neisseria Gonorrhea: NEGATIVE

## 2022-04-22 NOTE — Progress Notes (Signed)
2 Days Post-Op  Subjective: CC: Seen with attending.  Pain at incision and R foot that had recent transmetatarsal amputation. Note from podiatry states they plan to come by and see.  Tolerating diet without n/v Voiding Last BM 9/18  Objective: Vital signs in last 24 hours: Temp:  [97.7 F (36.5 C)-98.8 F (37.1 C)] 97.8 F (36.6 C) (09/21 0542) Pulse Rate:  [76-88] 79 (09/21 0542) Resp:  [16-20] 16 (09/21 0542) BP: (119-149)/(67-106) 119/67 (09/21 0542) SpO2:  [95 %-99 %] 99 % (09/21 0755) Last BM Date : 04/19/22  Intake/Output from previous day: 09/20 0701 - 09/21 0700 In: 3675.9 [P.O.:2180; I.V.:741.9; IV Piggyback:753.9] Out: 0  Intake/Output this shift: No intake/output data recorded.  PE: Gen:  Alert, NAD, pleasant Card:  Reg Pulm:  Rate and effort normal Abd: Soft, ND, NT. R groin wound packing removed. The wound has some granulation tissue as well as fibrinous tissue. Still a little soupy but no active drainage. Would like to watch overnight to ensure it is cleaning up with wtd dressing changes. No periwound erythema, heat, induration or surrounding fluctuance.  Ext:  No LE edema. R transmetatarsal amputation noted Psych: A&Ox3   Lab Results:  Recent Labs    04/21/22 0432 04/22/22 0455  WBC 20.0* 19.8*  HGB 14.5 12.1*  HCT 45.0 37.1*  PLT 237 255   BMET Recent Labs    04/20/22 1345 04/21/22 0432  NA 134* 139  K 3.6 5.5*  CL 99 107  CO2 25 25  GLUCOSE 109* 189*  BUN 11 12  CREATININE 1.04 1.08  CALCIUM 8.9 9.1   PT/INR Recent Labs    04/20/22 2235  LABPROT 13.2  INR 1.0   CMP     Component Value Date/Time   NA 139 04/21/2022 0432   K 5.5 (H) 04/21/2022 0432   CL 107 04/21/2022 0432   CO2 25 04/21/2022 0432   GLUCOSE 189 (H) 04/21/2022 0432   BUN 12 04/21/2022 0432   CREATININE 1.08 04/21/2022 0432   CREATININE 1.30 (H) 04/14/2022 0427   CALCIUM 9.1 04/21/2022 0432   PROT 7.0 04/20/2022 1345   ALBUMIN 3.7 04/20/2022 1345    AST 25 04/20/2022 1345   ALT 29 04/20/2022 1345   ALKPHOS 73 04/20/2022 1345   BILITOT 1.3 (H) 04/20/2022 1345   GFRNONAA >60 04/21/2022 0432   Lipase  No results found for: "LIPASE"  Studies/Results: No results found.  Anti-infectives: Anti-infectives (From admission, onward)    Start     Dose/Rate Route Frequency Ordered Stop   04/20/22 2200  ceFEPIme (MAXIPIME) 2 g in sodium chloride 0.9 % 100 mL IVPB  Status:  Discontinued        2 g 200 mL/hr over 30 Minutes Intravenous Every 8 hours 04/20/22 1708 04/20/22 2056   04/20/22 2200  piperacillin-tazobactam (ZOSYN) IVPB 3.375 g        3.375 g 12.5 mL/hr over 240 Minutes Intravenous Every 8 hours 04/20/22 2028 04/25/22 2159   04/20/22 1500  linezolid (ZYVOX) IVPB 600 mg        600 mg 300 mL/hr over 60 Minutes Intravenous Every 12 hours 04/20/22 1446     04/20/22 1415  vancomycin (VANCOREADY) IVPB 1500 mg/300 mL  Status:  Discontinued        1,500 mg 150 mL/hr over 120 Minutes Intravenous  Once 04/20/22 1411 04/20/22 1453   04/20/22 1400  ceFEPIme (MAXIPIME) 2 g in sodium chloride 0.9 % 100 mL IVPB  2 g 200 mL/hr over 30 Minutes Intravenous  Once 04/20/22 1348 04/20/22 1507   04/20/22 1400  metroNIDAZOLE (FLAGYL) IVPB 500 mg  Status:  Discontinued        500 mg 100 mL/hr over 60 Minutes Intravenous Every 12 hours 04/20/22 1349 04/20/22 2056        Assessment/Plan POD 2 s/p I&D of R groin abscess by Dr. Rosendo Gros on 04/20/22 - Would like to watch wound overnight to ensure cleaning up with wtd dressing changes. No plans for further surgery at this time.  - Cont abx. Cx pending. Afebrile. WBC stble at 19.8 - WTD BID. Please teach patient/family how to change dressing - discussed with RN - Mobilize (per last podiatry note he should be NWB w/ CAM boot to RLE) - Pulm toilet  FEN - Reg, SLIV. Hyperkalemia resolved  VTE - SCDs,  ID - Zosyn from our standpoint. On Linezolid per Podiatry (see below)? WBC 19.8 Foley - None,  voiding  Hx R transmetatarsal amputation on 03/16/22 by Dr. Jacqualyn Posey - Per last podiatry note he should be NWB w/ CAM boot to RLE and on a week of oral linezolid from 8/17. Per note from Podiatry they plan to come by and see while in the hospital   LOS: 2 days    Jillyn Ledger , [facility] Surgery 04/22/2022, 8:21 AM Please see Amion for pager number during day hours 7:00am-4:30pm

## 2022-04-22 NOTE — Consult Note (Signed)
Subjective:  Patient ID: George Shelton, male    DOB: 03-Jun-1981,  MRN: [id number]  Patient with past medical history of Polysubstance abuse, sepsis and right transmetatersal amputation and subsequent injury and revision of amputation admitted to hospital for right groin abscess and subsequent I&D seen at beside today for concern of right foot pain. He was supposed to follow-up in our clinic on Wednesday but was hospitalized a patient requested a visit to be evaluated. Relates he has continued to have pain in the right foot and ankle and concerned for infection and injury. Relates doing well after groin surgery.   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[facility])    HSV-1 (herpes simplex virus 1) infection    lip ulcer per Pt   HSV-2 (herpes simplex virus 2) infection    Left atrial enlargement    MRSA (methicillin resistant staph aureus) culture positive    Nicotine dependence    quit July 6,2023   Polysubstance abuse (HCC)    Sepsis [facility])      Past Surgical History:  Procedure Laterality Date   IRRIGATION AND DEBRIDEMENT ABSCESS Right 04/20/2022   Procedure: IRRIGATION AND DEBRIDEMENT groinABSCESS;  Surgeon: Axel Filler, MD;  Location: [facility] ORS;  Service: General;  Laterality: Right;   TOE AMPUTATION     Big Toe   TRANSMETATARSAL AMPUTATION Right 03/16/2022   Procedure: TRANSMETATARSAL AMPUTATION/I&D RIGHT FOOT/BONE BIOPSY;  Surgeon: Vivi Barrack, DPM;  Location: [facility] ORS;  Service: Podiatry;  Laterality: Right;  patient would like a block       Latest Ref Rng & Units 04/22/2022    4:55 AM 04/21/2022    4:32 AM 04/20/2022    1:45 PM  CBC  WBC 4.0 - 10.5 K/uL 19.8  20.0  15.4   Hemoglobin 13.0 - 17.0 g/dL 95.2  84.1  32.4   Hematocrit 39.0 - 52.0 % 37.1  45.0  41.8   Platelets 150 - 400 K/uL 255  237  251        Latest Ref Rng & Units 04/22/2022    8:13 AM 04/21/2022    4:32 AM 04/20/2022    1:45 PM  BMP  Glucose 70 - 99 mg/dL 401  027  253   BUN 6 - 20  mg/dL 13  12  11    Creatinine 0.61 - 1.24 mg/dL  6.64  4.03   Sodium 135 - 145 mmol/L 139  139  134   Potassium 3.5 - 5.1 mmol/L 3.6  5.5  3.6   Chloride 98 - 111 mmol/L 110  107  99   CO2 22 - 32 mmol/L 23  25  25    Calcium 8.9 - 10.3 mg/dL 8.4  9.1  8.9      Objective:   Vitals:   04/22/22 0755 04/22/22 1427  BP:  138/77  Pulse:  83  Resp:  18  Temp:  98 F (36.7 C)  SpO2: 99% 98%    General:AA&O x 3. Normal mood and affect   Vascular: DP and PT pulses 2/4 bilateral. Brisk capillary refill to all digits. Pedal hair present   Neruological. Epicritic sensation grossly intact.   Derm: Right foot distal incision well healed and no signs of dehiscence No erythema or edema present. No signs of infection noted.  Interspaces clears of maceration. Nails well groomed and normal in appearance  MSK: MMT 5/5 in dorsiflexion, plantar flexion, inversion and eversion. Normal joint ROM without pain or crepitus. Tenderness noted  circumferentially around foot and ankle.     Assessment & Plan:  Patient was evaluated and treated and all questions answered.  DX: s/p transmetatarsal amputation right foot Recommend keeping DSD with gauze and kerlix to keep protected as history of noncompliance with foot.  Antibiotics: Per primary  DME: CAM boot with WBAT  Discussed with patient diagnosis and treatment options. Advised to stay in CAM boot when ambulating until follow-up for re-evaluation.  Imaging reviewed.  Discussed no concern for infection at this point foot appears to be healing well. Discussed pain from injury with skid steer several weeks ago and keeping in CAM boot until soft tissue injuries are fully healed. Discussed potential nerve injury as a long term consequence but will see as time progresses.   Patient in agreement with plan and all questions answered.  Will follow-up with [place] in a few weeks upon discharge in clinic. Will get schedule for him.   Lorenda Peck,  DPM  Accessible via secure chat for questions or concerns.

## 2022-04-23 ENCOUNTER — Encounter: Payer: BC Managed Care – PPO | Admitting: Podiatry

## 2022-04-23 LAB — CBC
HCT: 39 % (ref 39.0–52.0)
Hemoglobin: 12.5 g/dL — ABNORMAL LOW (ref 13.0–17.0)
MCH: 29.3 pg (ref 26.0–34.0)
MCHC: 32.1 g/dL (ref 30.0–36.0)
MCV: 91.3 fL (ref 80.0–100.0)
Platelets: 312 10*3/uL (ref 150–400)
RBC: 4.27 MIL/uL (ref 4.22–5.81)
RDW: 13.2 % (ref 11.5–15.5)
WBC: 10.6 10*3/uL — ABNORMAL HIGH (ref 4.0–10.5)
nRBC: 0 % (ref 0.0–0.2)

## 2022-04-23 LAB — BASIC METABOLIC PANEL
Anion gap: 7 (ref 5–15)
BUN: 10 mg/dL (ref 6–20)
CO2: 25 mmol/L (ref 22–32)
Calcium: 8.3 mg/dL — ABNORMAL LOW (ref 8.9–10.3)
Chloride: 110 mmol/L (ref 98–111)
Creatinine, Ser: 1.08 mg/dL (ref 0.61–1.24)
GFR, Estimated: >60 mL/min (ref >60)
Glucose, Bld: 142 mg/dL — ABNORMAL HIGH (ref 70–99)
Potassium: 3.4 mmol/L — ABNORMAL LOW (ref 3.5–5.1)
Sodium: 142 mmol/L (ref 135–145)

## 2022-04-23 MED ORDER — LINEZOLID 600 MG PO TABS: 600.0000 mg | ORAL_TABLET | Freq: Two times a day (BID) | ORAL | 0 refills | Status: DC

## 2022-04-23 MED ORDER — TRAMADOL HCL 50 MG PO TABS: 50.0000 mg | ORAL_TABLET | Freq: Four times a day (QID) | ORAL | 0 refills | Status: DC | PRN

## 2022-04-23 NOTE — Discharge Summary (Signed)
Patient ID: George Shelton [id number] 02/07/1981 41 y.o.  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  He states he popped a small pimple in his right groin 3 days ago.  Yesterday he noted mild right groin soreness which progressed to severe pain, redness, and warmth today.  Associated symptoms include fever and tachycardia.  He denies dysuria, hematuria, or other urinary symptoms.  Denies blood in his stool.  He lives at home with his wife and daughter.  He currently works 2 jobs.  States he quit smoking in June 2023.  Reports using Adderall but no other drugs including marijuana, cocaine, or opioid medications.  Reports a drug allergy to vancomycin, states it attacks his blood cells and almost killed him during a previous hospital stay.   His cardiologist is Dr. Dulce Sellar, who saw the patient in March 2023 for chest pain.  He had a normal myocardial perfusion study with no signs of ischemia.  His doctor continued him on his beta-blocker, which he states he is not taking every single day as prescribed.  Procedures Dr. Derrell Lolling - 04/20/22 -irrigation and debridement of groin abscess (right) 25 x 3 cm incision  Hospital Course:  Patient presented as above.  He was found to have a right groin infection and was taken to the OR by Dr. Derrell Lolling and underwent irrigation debridement on 9/19.  He was stayed on antibiotics postop.  Cultures grew MRSA with  sensitivities still spending.  Pharmacy recommended transitioning to p.o. Zyvox twice daily x7 days.  On 9/22 he was changed to dry to dry dressing changes.  See progress note from earlier that day.  Patient was felt stable for discharge home.  Follow-up was arranged as below. Strict return precautions.    Allergies as of 04/23/2022       Reactions   Codeine Other (See Comments)   PATIENT DOES NOT WANT THIS   Other Other (See Comments)   History of drug addiction, per the patient- does NOT want pain medication of any kind. Sober/clean for 8 1/2 years. Patient states "NO OPIATES"!!!   Vancomycin Other (See Comments)   "It kills my white blood cells"- PATIENT SAID HE **WILL NOT, UNDER ANY CIRCUMSTANCES** TAKE THIS        Medication List     TAKE these medications    albuterol 108 (90 Base) MCG/ACT inhaler Commonly known as: VENTOLIN HFA Inhale 2 puffs into the lungs every 4 (four) hours as needed for wheezing or shortness of breath.   amphetamine-dextroamphetamine 20 MG tablet Commonly known as: ADDERALL Take 20 mg by mouth 2 (two) times daily.   Flovent HFA 110 MCG/ACT inhaler Generic drug: fluticasone Inhale 2 puffs into the lungs 2 (two) times daily.   gabapentin 600 MG tablet Commonly known as: Neurontin Take 1 tablet (600 mg total) by mouth 3 (three) times daily. What changed:  when to take this Another medication with the same name was removed. Continue taking this medication, and follow the directions you see here.   ibuprofen 200 MG tablet Commonly  known as: ADVIL Take 800 mg by mouth every 6 (six) hours as needed for mild pain, moderate pain or headache.   linezolid 600 MG tablet Commonly known as: Zyvox Take 1 tablet (600 mg total) by mouth 2 (two) times daily.   nebivolol 10 MG tablet Commonly known as: Bystolic Take 1 tablet (10 mg total) by mouth daily.   ondansetron 4 MG tablet Commonly known as: Zofran Take 1 tablet (4 mg total) by mouth every 8  (eight) hours as needed for nausea or vomiting.   traMADol 50 MG tablet Commonly known as: ULTRAM Take 1 tablet (50 mg total) by mouth every 6 (six) hours as needed (breakthrough pain). What changed: reasons to take this          Follow-up Information     [facility], [place] Follow up on 04/30/2022.   Specialty: General Surgery Why: 2pm for a nurse visit/wound check. Please arrive 30 minutes prior to your appointment for paperwork. Please bring a copy of your photo ID and insurance card. Contact information: [address] [place]  [address] [phone]         [facility], [place] Follow up on 05/07/2022.   Specialty: General Surgery Why: 9am. For a follow up appointment with the PA. Please arrive 30 minutes prior to your appointment for paperwork. Please bring a copy of your photo ID and insurance card. Contact information: [address] [place] [address] [phone]         Lorenda Peck, DPM. Schedule an appointment as soon as possible for a visit.   Specialty: Podiatry Contact information: [address] [place] [address] [phone]                 Signed: Yobany Vroom, [facility] Surgery 04/23/2022, 12:24 PM Please see Amion for pager number during day hours 7:00am-4:30pm

## 2022-04-23 NOTE — Progress Notes (Signed)
Patient was given discharge instructions, and all questions were answered. Patient was stable for discharged and was walked to the main exit.

## 2022-04-23 NOTE — Progress Notes (Addendum)
3 Days Post-Op  Subjective: CC: Reports yesterday afternoon he had an area of redness that the nurse marked on his right groin to right lower abdomen. Redness resolved this am. Tolerating diet without n/v. Voiding. Seen by Podiatry yesterday. Last BM yesterday per I/O.   Objective: Vital signs in last 24 hours: Temp:  [97.6 F (36.4 C)-98.5 F (36.9 C)] 97.6 F (36.4 C) (09/22 0611) Pulse Rate:  [71-83] 80 (09/22 0611) Resp:  [16-18] 16 (09/22 0611) BP: (136-159)/(77-98) 159/98 (09/22 0611) SpO2:  [98 %-99 %] 98 % (09/22 0840) Last BM Date : 04/22/22  Intake/Output from previous day: 09/21 0701 - 09/22 0700 In: 2391.1 [P.O.:1440; I.V.:198.4; IV Piggyback:752.7] Out: 2250 [Urine:2250] Intake/Output this shift: No intake/output data recorded.  PE: Gen:  Alert, NAD, pleasant Card:  Reg Pulm:  Rate and effort normal Abd: Soft, ND, NT. R groin wound packing removed. The wound has some granulation tissue as well as fibrinous/fibropurulent tissue. No periwound erythema, heat, induration or surrounding fluctuance appreciated Ext:  No LE edema. R transmetatarsal amputation noted Psych: A&Ox3     Lab Results:  Recent Labs    04/22/22 0455 04/23/22 0438  WBC 19.8* 10.6*  HGB 12.1* 12.5*  HCT 37.1* 39.0  PLT 255 312   BMET Recent Labs    04/22/22 0813 04/23/22 0438  NA 139 142  K 3.6 3.4*  CL 110 110  CO2 23 25  GLUCOSE 139* 142*  BUN 13 10  CREATININE 0.98 1.08  CALCIUM 8.4* 8.3*   PT/INR Recent Labs    04/20/22 2235  LABPROT 13.2  INR 1.0   CMP     Component Value Date/Time   NA 142 04/23/2022 0438   K 3.4 (L) 04/23/2022 0438   CL 110 04/23/2022 0438   CO2 25 04/23/2022 0438   GLUCOSE 142 (H) 04/23/2022 0438   BUN 10 04/23/2022 0438   CREATININE 1.08 04/23/2022 0438   CREATININE 1.30 (H) 04/14/2022 0427   CALCIUM 8.3 (L) 04/23/2022 0438   PROT 7.0 04/20/2022 1345   ALBUMIN 3.7 04/20/2022 1345   AST 25 04/20/2022 1345   ALT 29 04/20/2022 1345    ALKPHOS 73 04/20/2022 1345   BILITOT 1.3 (H) 04/20/2022 1345   GFRNONAA >60 04/23/2022 0438   Lipase  No results found for: "LIPASE"  Studies/Results: No results found.  Anti-infectives: Anti-infectives (From admission, onward)    Start     Dose/Rate Route Frequency Ordered Stop   04/20/22 2200  ceFEPIme (MAXIPIME) 2 g in sodium chloride 0.9 % 100 mL IVPB  Status:  Discontinued        2 g 200 mL/hr over 30 Minutes Intravenous Every 8 hours 04/20/22 1708 04/20/22 2056   04/20/22 2200  piperacillin-tazobactam (ZOSYN) IVPB 3.375 g        3.375 g 12.5 mL/hr over 240 Minutes Intravenous Every 8 hours 04/20/22 2028 04/25/22 2159   04/20/22 1500  linezolid (ZYVOX) IVPB 600 mg        600 mg 300 mL/hr over 60 Minutes Intravenous Every 12 hours 04/20/22 1446     04/20/22 1415  vancomycin (VANCOREADY) IVPB 1500 mg/300 mL  Status:  Discontinued        1,500 mg 150 mL/hr over 120 Minutes Intravenous  Once 04/20/22 1411 04/20/22 1453   04/20/22 1400  ceFEPIme (MAXIPIME) 2 g in sodium chloride 0.9 % 100 mL IVPB        2 g 200 mL/hr over 30 Minutes Intravenous  Once 04/20/22  1348 04/20/22 1507   04/20/22 1400  metroNIDAZOLE (FLAGYL) IVPB 500 mg  Status:  Discontinued        500 mg 100 mL/hr over 60 Minutes Intravenous Every 12 hours 04/20/22 1349 04/20/22 2056        Assessment/Plan POD 3 s/p I&D of R groin abscess by Dr. Rosendo Gros on 04/20/22 - Will discuss with MD but likely would watch wound overnight to ensure cleaning up with BID dry to dry dressing changes. No plans for further surgery at this time.  - Cont abx. Cx with staph aureus. Afebrile. WBC down at 10.6 - Please teach patient/family how to change dressing as near d/c  - Mobilize (per last podiatry note he should be NWB w/ CAM boot to RLE) - Pulm toilet  Addendum: Discussed with MD. Will plan for discharge with dry to dry dressing changes on 7d Zyvox (discussed with pharmacy) today. Close follow up in the office. RN going to  ensure he is able to perform dressing changes before d/c.     FEN - Reg, replace K VTE - SCDs,  ID - On Zosyn/Linezolid. Will discuss with pharm about narrowing Foley - None, voiding   Hx R transmetatarsal amputation on 03/16/22 by Dr. Jacqualyn Posey - Podiatry has seen.  NWB w/ CAM boot to RLE and on a week of oral linezolid per note from 8/17.    LOS: 3 days    Jillyn Ledger , [facility] Surgery 04/23/2022, 9:08 AM Please see Amion for pager number during day hours 7:00am-4:30pm

## 2022-04-23 NOTE — Progress Notes (Signed)
Orthopedic Tech Progress Note Patient Details:  George Shelton 24-Nov-1980 [id number]  Ortho Devices Type of Ortho Device: CAM walker Ortho Device/Splint Location: Right foot Ortho Device/Splint Interventions: Application   Post Interventions Patient Tolerated: Well  Linus Salmons Depaul Arizpe 04/23/2022, 9:41 AM

## 2022-04-23 NOTE — Discharge Instructions (Addendum)
Dry to Dry WOUND CARE: - Change dressing twice daily - Supplies: sterile saline, kerlex, scissors, ABD pads, tape  Remove dressing and all packing carefully, moistening with sterile saline as needed to avoid packing/internal dressing sticking to the wound. 2.   Clean edges of skin around the wound with water/gauze, making sure there is no tape debris or leakage left on skin that could cause skin irritation or breakdown. 3.   Pack wound with clean sterile kerflix from wound base to skin level, making sure to take note of any possible areas of wound tracking, tunneling and packing appropriately. Wound can be packed loosely. Trim kerlex to size if a whole kerlex is not required. 4.   Cover wound with a dry ABD pad and secure with tape or mesh underwear.  5.   Write the date/time on the dry dressing/tape to better track when the last dressing change occurred. - apply any skin protectant/powder if recommended by clinician to protect skin/skin folds. - change dressing as needed if leakage occurs, wound gets contaminated, or patient requests to shower. - You may shower daily with wound open and following the shower the wound should be dried and a clean dressing placed.  - Medical grade tape as well as packing supplies can be found at [employer] on [address] or [employer] on [address]. The remaining supplies can be found at your local drug store, [employer] etc.  Please call the office or seek medical care immediatly if you have More redness, swelling, or pain around your abscess. More fluid or blood coming from your abscess. Warm skin around your abscess. More pus or a bad smell coming from your abscess. Muscle aches. Chills or a general ill feeling. Have severe pain. See red streaks on your skin spreading away from the abscess. See redness that spreads quickly. Have a fever or chills.  Per podiatry - instructions after your transmetatarsal amputation right  foot Recommend keeping DSD with gauze and kerlix to keep protected DME: CAM boot with weight bearing as tolerated Stay in CAM boot when ambulating until follow-up for re-evaluation.  Please follow up with podiatry

## 2022-04-25 LAB — CULTURE, BLOOD (ROUTINE X 2)
Culture: NO GROWTH
Culture: NO GROWTH

## 2022-04-25 LAB — AEROBIC/ANAEROBIC CULTURE W GRAM STAIN (SURGICAL/DEEP WOUND): Gram Stain: NONE SEEN

## 2022-04-26 DIAGNOSIS — R0602 Shortness of breath: Secondary | ICD-10-CM | POA: Diagnosis not present

## 2022-04-26 DIAGNOSIS — J4541 Moderate persistent asthma with (acute) exacerbation: Secondary | ICD-10-CM | POA: Diagnosis not present

## 2022-04-26 DIAGNOSIS — J309 Allergic rhinitis, unspecified: Secondary | ICD-10-CM | POA: Diagnosis not present

## 2022-04-26 DIAGNOSIS — I10 Essential (primary) hypertension: Secondary | ICD-10-CM | POA: Diagnosis not present

## 2022-05-25 ENCOUNTER — Emergency Department (HOSPITAL_COMMUNITY): Payer: BC Managed Care – PPO

## 2022-05-25 ENCOUNTER — Encounter (HOSPITAL_COMMUNITY): Payer: Self-pay | Admitting: *Deleted

## 2022-05-25 ENCOUNTER — Telehealth: Payer: Self-pay | Admitting: Podiatry

## 2022-05-25 ENCOUNTER — Inpatient Hospital Stay (HOSPITAL_COMMUNITY)
Admission: EM | Admit: 2022-05-25 | Discharge: 2022-05-28 | DRG: 478 | Disposition: A | Discharge status: Home / Self Care | Payer: BC Managed Care – PPO | Attending: Internal Medicine | Admitting: Internal Medicine

## 2022-05-25 DIAGNOSIS — B009 Herpesviral infection, unspecified: Secondary | ICD-10-CM | POA: Diagnosis present

## 2022-05-25 DIAGNOSIS — M25571 Pain in right ankle and joints of right foot: Secondary | ICD-10-CM | POA: Diagnosis not present

## 2022-05-25 DIAGNOSIS — M86671 Other chronic osteomyelitis, right ankle and foot: Secondary | ICD-10-CM | POA: Diagnosis present

## 2022-05-25 DIAGNOSIS — L0390 Cellulitis, unspecified: Secondary | ICD-10-CM

## 2022-05-25 DIAGNOSIS — L02611 Cutaneous abscess of right foot: Secondary | ICD-10-CM | POA: Diagnosis not present

## 2022-05-25 DIAGNOSIS — I10 Essential (primary) hypertension: Secondary | ICD-10-CM | POA: Diagnosis not present

## 2022-05-25 DIAGNOSIS — Z885 Allergy status to narcotic agent status: Secondary | ICD-10-CM | POA: Diagnosis not present

## 2022-05-25 DIAGNOSIS — Z79899 Other long term (current) drug therapy: Secondary | ICD-10-CM | POA: Diagnosis not present

## 2022-05-25 DIAGNOSIS — R000 Tachycardia, unspecified: Secondary | ICD-10-CM | POA: Diagnosis not present

## 2022-05-25 DIAGNOSIS — I891 Lymphangitis: Secondary | ICD-10-CM

## 2022-05-25 DIAGNOSIS — A6002 Herpesviral infection of other male genital organs: Secondary | ICD-10-CM | POA: Diagnosis present

## 2022-05-25 DIAGNOSIS — Z8614 Personal history of Methicillin resistant Staphylococcus aureus infection: Secondary | ICD-10-CM | POA: Diagnosis not present

## 2022-05-25 DIAGNOSIS — T8743 Infection of amputation stump, right lower extremity: Principal | ICD-10-CM | POA: Diagnosis present

## 2022-05-25 DIAGNOSIS — Z833 Family history of diabetes mellitus: Secondary | ICD-10-CM | POA: Diagnosis not present

## 2022-05-25 DIAGNOSIS — F1910 Other psychoactive substance abuse, uncomplicated: Secondary | ICD-10-CM | POA: Diagnosis not present

## 2022-05-25 DIAGNOSIS — Y838 Other surgical procedures as the cause of abnormal reaction of the patient, or of later complication, without mention of misadventure at the time of the procedure: Secondary | ICD-10-CM | POA: Diagnosis not present

## 2022-05-25 DIAGNOSIS — J45909 Unspecified asthma, uncomplicated: Secondary | ICD-10-CM

## 2022-05-25 DIAGNOSIS — Z87891 Personal history of nicotine dependence: Secondary | ICD-10-CM | POA: Diagnosis not present

## 2022-05-25 DIAGNOSIS — Z89421 Acquired absence of other right toe(s): Secondary | ICD-10-CM

## 2022-05-25 DIAGNOSIS — Z800 Family history of malignant neoplasm of digestive organs: Secondary | ICD-10-CM | POA: Diagnosis not present

## 2022-05-25 DIAGNOSIS — R0602 Shortness of breath: Secondary | ICD-10-CM | POA: Diagnosis not present

## 2022-05-25 DIAGNOSIS — M86679 Other chronic osteomyelitis, unspecified ankle and foot: Secondary | ICD-10-CM | POA: Diagnosis not present

## 2022-05-25 DIAGNOSIS — L03115 Cellulitis of right lower limb: Secondary | ICD-10-CM

## 2022-05-25 DIAGNOSIS — Z881 Allergy status to other antibiotic agents status: Secondary | ICD-10-CM

## 2022-05-25 DIAGNOSIS — L539 Erythematous condition, unspecified: Secondary | ICD-10-CM | POA: Diagnosis not present

## 2022-05-25 DIAGNOSIS — J4541 Moderate persistent asthma with (acute) exacerbation: Secondary | ICD-10-CM | POA: Diagnosis not present

## 2022-05-25 DIAGNOSIS — Z8249 Family history of ischemic heart disease and other diseases of the circulatory system: Secondary | ICD-10-CM | POA: Diagnosis not present

## 2022-05-25 DIAGNOSIS — R042 Hemoptysis: Secondary | ICD-10-CM

## 2022-05-25 DIAGNOSIS — M792 Neuralgia and neuritis, unspecified: Secondary | ICD-10-CM

## 2022-05-25 DIAGNOSIS — Z9889 Other specified postprocedural states: Secondary | ICD-10-CM | POA: Diagnosis not present

## 2022-05-25 DIAGNOSIS — G629 Polyneuropathy, unspecified: Secondary | ICD-10-CM | POA: Diagnosis not present

## 2022-05-25 DIAGNOSIS — M869 Osteomyelitis, unspecified: Secondary | ICD-10-CM | POA: Diagnosis not present

## 2022-05-25 DIAGNOSIS — Z808 Family history of malignant neoplasm of other organs or systems: Secondary | ICD-10-CM | POA: Diagnosis not present

## 2022-05-25 DIAGNOSIS — A419 Sepsis, unspecified organism: Secondary | ICD-10-CM | POA: Diagnosis not present

## 2022-05-25 DIAGNOSIS — L02415 Cutaneous abscess of right lower limb: Secondary | ICD-10-CM | POA: Diagnosis not present

## 2022-05-25 DIAGNOSIS — J309 Allergic rhinitis, unspecified: Secondary | ICD-10-CM | POA: Diagnosis not present

## 2022-05-25 DIAGNOSIS — M79671 Pain in right foot: Secondary | ICD-10-CM | POA: Diagnosis not present

## 2022-05-25 DIAGNOSIS — M7989 Other specified soft tissue disorders: Secondary | ICD-10-CM | POA: Diagnosis not present

## 2022-05-25 HISTORY — DX: Unspecified asthma, uncomplicated: J45.909

## 2022-05-25 HISTORY — DX: Neuralgia and neuritis, unspecified: M79.2

## 2022-05-25 HISTORY — DX: Hemoptysis: R04.2

## 2022-05-25 HISTORY — DX: Lymphangitis: I89.1

## 2022-05-25 HISTORY — DX: Cellulitis, unspecified: L03.90

## 2022-05-25 LAB — CBC WITH DIFFERENTIAL/PLATELET
Abs Immature Granulocytes: 0.02 10*3/uL (ref 0.00–0.07)
Basophils Absolute: 0 10*3/uL (ref 0.0–0.1)
Basophils Relative: 0 %
Eosinophils Absolute: 0.1 10*3/uL (ref 0.0–0.5)
Eosinophils Relative: 1 %
HCT: 35.9 % — ABNORMAL LOW (ref 39.0–52.0)
Hemoglobin: 11.7 g/dL — ABNORMAL LOW (ref 13.0–17.0)
Immature Granulocytes: 0 %
Lymphocytes Relative: 8 %
Lymphs Abs: 0.8 10*3/uL (ref 0.7–4.0)
MCH: 28.8 pg (ref 26.0–34.0)
MCHC: 32.6 g/dL (ref 30.0–36.0)
MCV: 88.4 fL (ref 80.0–100.0)
Monocytes Absolute: 0.6 10*3/uL (ref 0.1–1.0)
Monocytes Relative: 6 %
Neutro Abs: 7.8 10*3/uL — ABNORMAL HIGH (ref 1.7–7.7)
Neutrophils Relative %: 85 %
Platelets: 225 10*3/uL (ref 150–400)
RBC: 4.06 MIL/uL — ABNORMAL LOW (ref 4.22–5.81)
RDW: 13.6 % (ref 11.5–15.5)
WBC: 9.2 10*3/uL (ref 4.0–10.5)
nRBC: 0 % (ref 0.0–0.2)

## 2022-05-25 LAB — COMPREHENSIVE METABOLIC PANEL
ALT: 37 U/L (ref 0–44)
AST: 35 U/L (ref 15–41)
Albumin: 3.4 g/dL — ABNORMAL LOW (ref 3.5–5.0)
Alkaline Phosphatase: 66 U/L (ref 38–126)
Anion gap: 7 (ref 5–15)
BUN: 22 mg/dL — ABNORMAL HIGH (ref 6–20)
CO2: 24 mmol/L (ref 22–32)
Calcium: 8.6 mg/dL — ABNORMAL LOW (ref 8.9–10.3)
Chloride: 105 mmol/L (ref 98–111)
Creatinine, Ser: 1.43 mg/dL — ABNORMAL HIGH (ref 0.61–1.24)
GFR, Estimated: >60 mL/min (ref >60)
Glucose, Bld: 105 mg/dL — ABNORMAL HIGH (ref 70–99)
Potassium: 3.5 mmol/L (ref 3.5–5.1)
Sodium: 136 mmol/L (ref 135–145)
Total Bilirubin: 0.6 mg/dL (ref 0.3–1.2)
Total Protein: 6.2 g/dL — ABNORMAL LOW (ref 6.5–8.1)

## 2022-05-25 LAB — PROTIME-INR
INR: 0.9 (ref 0.8–1.2)
Prothrombin Time: 12.2 seconds (ref 11.4–15.2)

## 2022-05-25 LAB — APTT: aPTT: 32 seconds (ref 24–36)

## 2022-05-25 LAB — LACTIC ACID, PLASMA: Lactic Acid, Venous: 1.2 mmol/L (ref 0.5–1.9)

## 2022-05-25 MED ORDER — BUDESONIDE 0.25 MG/2ML IN SUSP
0.2500 mg | Freq: Two times a day (BID) | RESPIRATORY_TRACT | Status: DC
  Administered 2022-05-26 – 2022-05-28 (×4): 0.25 mg via RESPIRATORY_TRACT
  Filled 2022-05-25 (×6): qty 2

## 2022-05-25 MED ORDER — SODIUM CHLORIDE 0.9 % IV SOLN
1.0000 g | INTRAVENOUS | Status: DC
  Administered 2022-05-25 – 2022-05-27 (×3): 1 g via INTRAVENOUS
  Filled 2022-05-25 (×3): qty 10

## 2022-05-25 MED ORDER — POLYETHYLENE GLYCOL 3350 17 G PO PACK: 17.0000 g | PACK | Freq: Every day | ORAL | Status: DC | PRN

## 2022-05-25 MED ORDER — GABAPENTIN 300 MG PO CAPS
600.0000 mg | ORAL_CAPSULE | Freq: Two times a day (BID) | ORAL | Status: DC
  Administered 2022-05-25 – 2022-05-28 (×5): 600 mg via ORAL
  Filled 2022-05-25 (×5): qty 2

## 2022-05-25 MED ORDER — NEBIVOLOL HCL 10 MG PO TABS
10.0000 mg | ORAL_TABLET | Freq: Every day | ORAL | Status: DC
  Administered 2022-05-26 – 2022-05-28 (×2): 10 mg via ORAL
  Filled 2022-05-25 (×3): qty 1

## 2022-05-25 MED ORDER — ALBUTEROL SULFATE HFA 108 (90 BASE) MCG/ACT IN AERS: 2.0000 | INHALATION_SPRAY | RESPIRATORY_TRACT | Status: DC | PRN

## 2022-05-25 MED ORDER — ALBUTEROL SULFATE (2.5 MG/3ML) 0.083% IN NEBU
2.5000 mg | INHALATION_SOLUTION | Freq: Four times a day (QID) | RESPIRATORY_TRACT | Status: DC | PRN
  Administered 2022-05-26: 2.5 mg via RESPIRATORY_TRACT
  Filled 2022-05-25: qty 3

## 2022-05-25 MED ORDER — ACETAMINOPHEN 325 MG PO TABS
650.0000 mg | ORAL_TABLET | Freq: Four times a day (QID) | ORAL | Status: DC | PRN
  Administered 2022-05-28: 650 mg via ORAL
  Filled 2022-05-25: qty 2

## 2022-05-25 MED ORDER — LACTATED RINGERS IV SOLN: INTRAVENOUS | Status: AC

## 2022-05-25 MED ORDER — ENOXAPARIN SODIUM 40 MG/0.4ML IJ SOSY
40.0000 mg | PREFILLED_SYRINGE | Freq: Every day | INTRAMUSCULAR | Status: DC
  Administered 2022-05-25: 40 mg via SUBCUTANEOUS
  Filled 2022-05-25 (×2): qty 0.4

## 2022-05-25 MED ORDER — ACETAMINOPHEN 650 MG RE SUPP: 650.0000 mg | Freq: Four times a day (QID) | RECTAL | Status: DC | PRN

## 2022-05-25 MED ORDER — SODIUM CHLORIDE 0.9% FLUSH
3.0000 mL | Freq: Two times a day (BID) | INTRAVENOUS | Status: DC
  Administered 2022-05-25 – 2022-05-27 (×4): 3 mL via INTRAVENOUS

## 2022-05-25 MED ORDER — VALACYCLOVIR HCL 500 MG PO TABS
500.0000 mg | ORAL_TABLET | Freq: Every day | ORAL | Status: DC
  Administered 2022-05-26 – 2022-05-28 (×2): 500 mg via ORAL
  Filled 2022-05-25 (×3): qty 1

## 2022-05-25 MED ORDER — AMPHETAMINE-DEXTROAMPHETAMINE 10 MG PO TABS
20.0000 mg | ORAL_TABLET | Freq: Two times a day (BID) | ORAL | Status: DC
  Administered 2022-05-26 – 2022-05-28 (×3): 20 mg via ORAL
  Filled 2022-05-25 (×3): qty 2

## 2022-05-25 MED ORDER — FLUTICASONE PROPIONATE HFA 110 MCG/ACT IN AERO: 2.0000 | INHALATION_SPRAY | Freq: Two times a day (BID) | RESPIRATORY_TRACT | Status: DC

## 2022-05-25 NOTE — ED Notes (Signed)
Pt is coming from [place] due to Osteomyelitis of foot. Pt has extensive history and amputation of toes. They are faxing notes from PCP office.

## 2022-05-25 NOTE — ED Notes (Signed)
Pt appears to be sleeping, observe even RR and unlabored, NAD noted, side rails up x2 for safety, plan of care ongoing, call light within reach, no further concerns as of present.  Wife at the bedside. Pt has urinal next to him for when he needs to urinate, is aware of sample needed

## 2022-05-25 NOTE — Telephone Encounter (Signed)
Patient was told to go to the ED for further evaluation due to me not getting enough details as to what was going on with the patient from Dr. Jimmye Norman. Patient is going to [facility] to be further evaluated.

## 2022-05-25 NOTE — H&P (Signed)
History and Physical   George Shelton TDV:[id number] DOB: Oct 28, 1980 DOA: 05/25/2022  PCP: Pcp, No   Patient coming from: Home  Chief Complaint: Leg pain and erythema  HPI: George Shelton is a 41 y.o. male with medical history significant of substance use, cellulitis, MRSA, HSV, osteomyelitis, status post transmetatarsal amputation senting with right foot erythema and pain with red streaking up his leg.  Patient reports pain in his right foot for the past day or so.  He has also noticed erythema that is began to streak up his right leg.  He is also noted some drainage of the right foot.  Of note does have a history of osteomyelitis status post transmetatarsal amputation and history of MRSA infection.  Also reports history of severe reaction to vancomycin so he cannot take this.  Was recently admitted by surgical service for right groin infection for which she underwent debridement and irrigation and was treated with antibiotics in addition.  He reports a productive cough with some intermittent hemoptysis with associated shortness of breath for the past few weeks.  No  He denies fevers, chills, chest pain, abdominal pain, constipation, diarrhea, nausea, vomiting.  ED Course: Vital signs in the ED notable for initial heart rate in the 100s improved to the 90s with initial interventions, blood pressure in the 062I to 948N systolic.  Respiratory rate initially in the 20s but improved to the teens with initial interventions.  Lab work-up included CMP with BUN 22, creatinine elevated to 1.43 from baseline of 1, glucose 105, calcium 8.2, protein 6.2, albumin 3.4.  CBC with hemoglobin stable 11.7.  Lactic acid normal with repeat pending.  Urinalysis and urine culture pending.  Chest x-ray showed no acute abnormality.  Right foot x-ray showed no evidence of osteomyelitis and demonstrated patient is status post amputation.  Patient received IV fluids in the ED.  Review of Systems: As per HPI  otherwise all other systems reviewed and are negative.  Past Medical History:  Diagnosis Date   Abscess 04/20/2022   Angina at rest    Cellulitis of right foot 03/15/2022   Crush injury to foot, right, initial encounter 03/15/2022   HSV-1 (herpes simplex virus 1) infection    lip ulcer per Pt   HSV-2 (herpes simplex virus 2) infection    Infection of right wrist ([place]) 07/12/2021   Left atrial enlargement    Medication monitoring encounter 12/30/2021   MRSA (methicillin resistant staph aureus) culture positive    Nicotine dependence    quit July 6,2023   PICC (peripherally inserted central catheter) in place 12/30/2021   Polysubstance abuse ([place])    Sepsis ([place])    Wrist joint infection ([place]) 07/14/2021    Past Surgical History:  Procedure Laterality Date   IRRIGATION AND DEBRIDEMENT ABSCESS Right 04/20/2022   Procedure: IRRIGATION AND DEBRIDEMENT groinABSCESS;  Surgeon: Ralene Ok, MD;  Location: [facility];  Service: General;  Laterality: Right;   TOE AMPUTATION     Big Toe   TRANSMETATARSAL AMPUTATION Right 03/16/2022   Procedure: TRANSMETATARSAL AMPUTATION/I&D RIGHT FOOT/BONE BIOPSY;  Surgeon: Trula Slade, DPM;  Location: [facility];  Service: Podiatry;  Laterality: Right;  patient would like a block    Social History  reports that he quit smoking about 5 months ago. His smoking use included cigarettes. He smoked an average of 1.5 packs per day. He has quit using smokeless tobacco.  His smokeless tobacco use included chew. He reports that he does not currently use alcohol. He reports that  he does not use drugs.  Allergies  Allergen Reactions   Codeine Other (See Comments)    PATIENT DOES NOT WANT THIS   Other Other (See Comments)    History of drug addiction, per the patient- does NOT want pain medication of any kind. Sober/clean for 8 1/2 years.  Patient states "NO OPIATES"!!!   Vancomycin Other (See Comments)    "It kills my white blood cells"- PATIENT SAID HE **WILL  NOT, UNDER ANY CIRCUMSTANCES** TAKE THIS    Family History  Problem Relation Age of Onset   Heart attack Father    Hypertension Father    Diabetes Father    Melanoma Father    Sleep apnea Father    Hypertension Maternal Grandmother    Atrial fibrillation Maternal Grandmother    Hypertension Maternal Grandfather    Pancreatic cancer Paternal Grandmother   Reviewed on admission  Prior to Admission medications   Medication Sig Start Date End Date Taking? Authorizing Provider  albuterol (VENTOLIN HFA) 108 (90 Base) MCG/ACT inhaler Inhale 2 puffs into the lungs every 4 (four) hours as needed for wheezing or shortness of breath. 03/24/22  Yes [provider]  amphetamine-dextroamphetamine (ADDERALL) 20 MG tablet Take 20 mg by mouth 2 (two) times daily.   Yes [provider]  FLOVENT HFA 110 MCG/ACT inhaler Inhale 2 puffs into the lungs 2 (two) times daily. 03/25/22  Yes [provider]  gabapentin (NEURONTIN) 600 MG tablet Take 1 tablet (600 mg total) by mouth 3 (three) times daily. Patient taking differently: Take 600 mg by mouth 2 (two) times daily. 12/01/21  Yes Stover, Titorya, DPM  ibuprofen (ADVIL) 200 MG tablet Take 800 mg by mouth every 6 (six) hours as needed for mild pain, moderate pain or headache.   Yes [provider]  linezolid (ZYVOX) 600 MG tablet Take 1 tablet (600 mg total) by mouth 2 (two) times daily. 04/23/22  Yes Maczis, Barth Kirks, PA-C  nebivolol (BYSTOLIC) 10 MG tablet Take 1 tablet (10 mg total) by mouth daily. 03/18/22 06/16/22 Yes [place] Eric J, DO  valACYclovir (VALTREX) 500 MG tablet Take 500 mg by mouth daily. 04/27/22  Yes [provider]  ondansetron (ZOFRAN) 4 MG tablet Take 1 tablet (4 mg total) by mouth every 8 (eight) hours as needed for nausea or vomiting. Patient not taking: Reported on 04/20/2022 01/19/22   Lorenda Peck, DPM  traMADol (ULTRAM) 50 MG tablet Take 1 tablet (50 mg total) by mouth every 6 (six) hours as  needed (breakthrough pain). Patient not taking: Reported on 05/25/2022 04/23/22   George, Shelton    Physical Exam: Vitals:   05/25/22 2145 05/25/22 2200 05/25/22 2215 05/25/22 2230  BP: 127/74 138/70 (!) 150/72 (!) 156/98  Pulse: 99 97 99 99  Resp: '14 17 19 ' (!) 21  Temp:      SpO2: 99% 98% 99% 98%  Weight:      Height:        Physical Exam Constitutional:      General: He is not in acute distress.    Appearance: Normal appearance.  HENT:     Head: Normocephalic and atraumatic.     Mouth/Throat:     Mouth: Mucous membranes are moist.     Pharynx: Oropharynx is clear.  Eyes:     Extraocular Movements: Extraocular movements intact.     Pupils: Pupils are equal, round, and reactive to light.  Cardiovascular:     Rate and Rhythm: Regular rhythm. Tachycardia  present.     Pulses: Normal pulses.     Heart sounds: Normal heart sounds.  Pulmonary:     Effort: Pulmonary effort is normal. No respiratory distress.     Breath sounds: Normal breath sounds.  Abdominal:     General: Bowel sounds are normal. There is no distension.     Palpations: Abdomen is soft.     Tenderness: There is no abdominal tenderness.  Musculoskeletal:        General: No swelling or deformity.     Comments: Status post right transmetatarsal amputation.  Skin:    General: Skin is warm and dry.     Findings: Erythema present.     Comments: Right foot erythema, edema, warmth with erythema streaking up the right leg.  Neurological:     General: No focal deficit present.     Mental Status: Mental status is at baseline.    Labs on Admission: I have personally reviewed following labs and imaging studies  CBC: Recent Labs  Lab 05/25/22 2043  WBC 9.2  NEUTROABS 7.8*  HGB 11.7*  HCT 35.9*  MCV 88.4  PLT 808    Basic Metabolic Panel: Recent Labs  Lab 05/25/22 2043  NA 136  K 3.5  CL 105  CO2 24  GLUCOSE 105*  BUN 22*  CREATININE 1.43*  CALCIUM 8.6*    GFR: Estimated Creatinine  Clearance: 76.1 mL/min (A) (by C-G formula based on SCr of 1.43 mg/dL (H)).  Liver Function Tests: Recent Labs  Lab 05/25/22 2043  AST 35  ALT 37  ALKPHOS 66  BILITOT 0.6  PROT 6.2*  ALBUMIN 3.4*    Urine analysis:    Component Value Date/Time   COLORURINE STRAW (A) 04/20/2022 1322   APPEARANCEUR CLEAR 04/20/2022 1322   LABSPEC 1.000 (L) 04/20/2022 1322   PHURINE 7.0 04/20/2022 1322   GLUCOSEU >=500 (A) 04/20/2022 1322   HGBUR NEGATIVE 04/20/2022 1322   BILIRUBINUR NEGATIVE 04/20/2022 1322   KETONESUR NEGATIVE 04/20/2022 1322   PROTEINUR NEGATIVE 04/20/2022 1322   NITRITE NEGATIVE 04/20/2022 1322   LEUKOCYTESUR NEGATIVE 04/20/2022 1322    Radiological Exams on Admission: DG Foot Complete Right  Result Date: 05/25/2022 CLINICAL DATA:  Questionable sepsis - evaluate for abnormality EXAM: RIGHT FOOT COMPLETE - 3+ VIEW COMPARISON:  X-ray right foot 03/15/2022 FINDINGS: Status post amputation of all toes at the level of distal metatarsal. No cortical erosion or destruction. There is no evidence of fracture or dislocation. There is no evidence of arthropathy or other focal bone abnormality. Soft tissues are unremarkable. IMPRESSION: 1. No radiographic finding of osteomyelitis. If high clinical suspicion, please consider MRI (with intravenous contrast if GFR greater than 30) for further evaluation. 2. Status post amputation of all toes at the level of distal metatarsal. Electronically Signed   By: Iven Finn M.D.   On: 05/25/2022 21:00   DG Chest Port 1 View  Result Date: 05/25/2022 CLINICAL DATA:  Questionable sepsis.  Erythema in the leg. EXAM: PORTABLE CHEST 1 VIEW COMPARISON:  None Available. FINDINGS: The heart size and mediastinal contours are within normal limits. Both lungs are clear. The visualized skeletal structures are unremarkable. IMPRESSION: No active disease. Electronically Signed   By: San Morelle M.D.   On: 05/25/2022 20:57    EKG: Independently  reviewed.  Sinus tachycardia 103 bpm.  Some baseline artifact in V3.  Nonspecific T wave flattening.  Assessment/Plan Principal Problem:   Lymphangitis Active Problems:   HSV-1 (herpes simplex virus 1) infection  Asthma, chronic   Cough with hemoptysis   Cellulitis   Lymphangitis Cellulitis Rule out osteomyelitis > Known history of prior osteomyelitis status post transmetatarsal amputation.  In the right foot.  Now with right foot pain and redness with redness streaking up the leg. > Examination is consistent with cellulitis/lymphangitis.  Did have some tachycardia but no leukocytosis on presentation. > Given his history we will also work to further rule out osteomyelitis.  X-ray showed no evidence of osteomyelitis.  Will check ESR and CRP. > History of MRSA, reports some mild purulent drainage at home. We will start with ceftriaxone for now.  Does have severe allergy to vancomycin. - Monitor on telemetry - Ceftriaxone IV daily, may need to transition to Zyvox if fails to respond (use appears to be restricted to ID) - Trend fever curve and WBC - Monitor skin changes response to treatment - MRSA swab - ESR, CRP  Hemoptysis > Reporting several weeks of intermittent shortness of breath and cough productive of blood. > Unclear etiology, current chest x-ray was unremarkable. - We will continue to monitor for recurrent episode and check for Hemoccult if occurs - If he ends up being seen by ID their input could be useful  History of HSV - Continue home Valtrex  Asthma - Continue home Flovent and as needed albuterol  Neuropathic pain - Gabapentin  DVT prophylaxis: Lovenox Code Status:   Full Family Communication:  Updated at bedside Disposition Plan:   Patient is from:  Home  Anticipated DC to:  Home  Anticipated DC date:  3 days  Anticipated DC barriers: None  Consults called:  None Admission status:  Observation, telemetry  Severity of Illness: The appropriate patient  status for this patient is OBSERVATION. Observation status is judged to be reasonable and necessary in order to provide the required intensity of service to ensure the patient's safety. The patient's presenting symptoms, physical exam findings, and initial radiographic and laboratory data in the context of their medical condition is felt to place them at decreased risk for further clinical deterioration. Furthermore, it is anticipated that the patient will be medically stable for discharge from the hospital within 2 midnights of admission.   Marcelyn Bruins MD [facility]  How to contact the [facility] Attending or Consulting provider [facility] or covering provider during after hours [place], for this patient?   Check the care team in [facility] and look for a) attending/consulting [facility] provider listed and b) the [facility] team listed Log into [email or web address] and use [place] universal password to access. If you do not have the password, please contact the hospital operator. Locate the [facility] provider you are looking for under [facility] and page to a number that you can be directly reached. If you still have difficulty reaching the provider, please page the [facility] (Director on Call) for the Hospitalists listed on amion for assistance.  05/25/2022, 10:57 PM

## 2022-05-25 NOTE — ED Provider Notes (Signed)
[facility] DEPT Provider Note   CSN: [id number] Arrival date & time: 05/25/22  1954     History {Add pertinent medical, surgical, social history, OB history to HPI:1} Chief Complaint  Patient presents with   Leg Pain    George Shelton is a 41 y.o. male.  He has a complex medical history with prior transmetatarsal amputation right foot and recent admission to general surgery for right groin infection.  He continues to have pain in his right foot and today noticed drainage from the foot and red streaking going up in his leg.  He also noticed a knot in his right upper leg.  He is also had some cough productive of some blood and shortness of breath its been going on for 3 weeks.  He denies any fevers although has been taking Tylenol and ibuprofen.  Former smoker an IV drug user denies any recent use.  The history is provided by the patient.  Leg Pain Location:  Foot and leg Injury: no   Leg location:  R upper leg and R lower leg Foot location:  R foot Pain details:    Quality:  Aching   Severity:  Moderate   Onset quality:  Gradual   Duration:  1 month   Timing:  Constant   Progression:  Worsening Chronicity:  Recurrent Prior injury to area:  Yes Relieved by:  Nothing Worsened by:  Bearing weight Ineffective treatments:  None tried Associated symptoms: no fever        Home Medications Prior to Admission medications   Medication Sig Start Date End Date Taking? Authorizing Provider  albuterol (VENTOLIN HFA) 108 (90 Base) MCG/ACT inhaler Inhale 2 puffs into the lungs every 4 (four) hours as needed for wheezing or shortness of breath. 03/24/22   [provider]  amphetamine-dextroamphetamine (ADDERALL) 20 MG tablet Take 20 mg by mouth 2 (two) times daily.    [provider]  FLOVENT HFA 110 MCG/ACT inhaler Inhale 2 puffs into the lungs 2 (two) times daily. 03/25/22   [provider]  gabapentin (NEURONTIN) 600 MG tablet  Take 1 tablet (600 mg total) by mouth 3 (three) times daily. Patient taking differently: Take 600 mg by mouth 2 (two) times daily. 12/01/21   Landis Martins, DPM  ibuprofen (ADVIL) 200 MG tablet Take 800 mg by mouth every 6 (six) hours as needed for mild pain, moderate pain or headache.    [provider]  linezolid (ZYVOX) 600 MG tablet Take 1 tablet (600 mg total) by mouth 2 (two) times daily. 04/23/22   Maczis, Barth Kirks, PA-C  nebivolol (BYSTOLIC) 10 MG tablet Take 1 tablet (10 mg total) by mouth daily. 03/18/22 06/16/22  [place] Eric J, DO  ondansetron (ZOFRAN) 4 MG tablet Take 1 tablet (4 mg total) by mouth every 8 (eight) hours as needed for nausea or vomiting. Patient not taking: Reported on 04/20/2022 01/19/22   Lorenda Peck, DPM  traMADol (ULTRAM) 50 MG tablet Take 1 tablet (50 mg total) by mouth every 6 (six) hours as needed (breakthrough pain). 04/23/22   Maczis, Barth Kirks, PA-C      Allergies    Codeine, Other, and Vancomycin    Review of Systems   Review of Systems  Constitutional:  Negative for fever.  HENT:  Negative for sore throat.   Respiratory:  Positive for cough and shortness of breath.   Cardiovascular:  Negative for chest pain.  Gastrointestinal:  Negative for abdominal pain.  Genitourinary:  Negative  for dysuria.  Skin:  Positive for rash and wound.    Physical Exam Updated Vital Signs Pulse (!) 109   Temp 98.5 F (36.9 C)   Resp (!) 24   Ht 6\' 1"  (1.854 m)   Wt 88.5 kg   SpO2 99%   BMI 25.73 kg/m  Physical Exam Vitals and nursing note reviewed.  Constitutional:      General: He is not in acute distress.    Appearance: Normal appearance. He is well-developed.  HENT:     Head: Normocephalic and atraumatic.  Eyes:     Conjunctiva/sclera: Conjunctivae normal.  Cardiovascular:     Rate and Rhythm: Regular rhythm. Tachycardia present.     Heart sounds: No murmur heard. Pulmonary:     Effort: Pulmonary effort is normal. No respiratory  distress.     Breath sounds: Normal breath sounds.  Abdominal:     Palpations: Abdomen is soft.     Tenderness: There is no abdominal tenderness. There is no guarding or rebound.  Musculoskeletal:        General: Tenderness present. Normal range of motion.     Cervical back: Neck supple.     Comments: She has a well-healed surgical incision over his right transmetatarsal area.  There is an area of scab and some erythema on the top of his foot.  There are some lymphangitic streaking going up to his groin.  There are some tender lymph nodes there although no fluctuance.  No crepitus.  There is a well-healed surgical incision in his right perineal area that is nontender.  Skin:    General: Skin is warm and dry.     Capillary Refill: Capillary refill takes less than 2 seconds.  Neurological:     General: No focal deficit present.     Mental Status: He is alert.     Sensory: No sensory deficit.     Motor: No weakness.     ED Results / Procedures / Treatments   Labs (all labs ordered are listed, but only abnormal results are displayed) Labs Reviewed - No data to display  EKG EKG Interpretation  Date/Time:  Tuesday May 25 2022 20:08:43 EDT Ventricular Rate:  103 PR Interval:  132 QRS Duration: 78 QT Interval:  304 QTC Calculation: 398 R Axis:   75 Text Interpretation: Sinus tachycardia Borderline T wave abnormalities No significant change since prior 9/23 Confirmed by 10/23 [phone]) on 05/25/2022 8:25:15 PM  Radiology No results found.  Procedures Procedures  {Document cardiac monitor, telemetry assessment procedure when appropriate:1}  Medications Ordered in ED Medications  lactated ringers infusion (has no administration in time range)    ED Course/ Medical Decision Making/ A&P                           Medical Decision Making Amount and/or Complexity of Data Reviewed Labs: ordered. Radiology: ordered.  Risk Prescription drug management.   This  patient complains of ***; this involves an extensive number of treatment Options and is a complaint that carries with it a high risk of complications and morbidity. The differential includes ***  I ordered, reviewed and interpreted labs, which included *** I ordered medication *** and reviewed PMP when indicated. I ordered imaging studies which included *** and I independently    visualized and interpreted imaging which showed *** Additional history obtained from *** Previous records obtained and reviewed *** I consulted *** and discussed lab and imaging findings  and discussed disposition.  Cardiac monitoring reviewed, *** Social determinants considered, *** Critical Interventions: ***  After the interventions stated above, I reevaluated the patient and found *** Admission and further testing considered, ***   {Document critical care time when appropriate:1} {Document review of labs and clinical decision tools ie heart score, Chads2Vasc2 etc:1}  {Document your independent review of radiology images, and any outside records:1} {Document your discussion with family members, caretakers, and with consultants:1} {Document social determinants of health affecting pt's care:1} {Document your decision making why or why not admission, treatments were needed:1} Final Clinical Impression(s) / ED Diagnoses Final diagnoses:  None    Rx / DC Orders ED Discharge Orders     None

## 2022-05-25 NOTE — Telephone Encounter (Signed)
Dr Jimmye Norman was trying to take pt MRI ans stated pt was in a lot of pain and Dr saw blood streaking. Dr would like a call back at [phone] to figure out what we want to do with our pt  Please advise

## 2022-05-25 NOTE — ED Triage Notes (Signed)
Pt reporting left streaks from his left foot up through his right leg all the way up to his right groin area. Pt has incision in the right groin from surgery at the end of September when he was being treated for osteomyelitis in the right foot.  Has had been intermittently been coughing up blood for 3 weeks. Unsure of fevers

## 2022-05-26 ENCOUNTER — Observation Stay (HOSPITAL_COMMUNITY): Payer: BC Managed Care – PPO

## 2022-05-26 DIAGNOSIS — T8743 Infection of amputation stump, right lower extremity: Secondary | ICD-10-CM | POA: Diagnosis present

## 2022-05-26 DIAGNOSIS — Z87891 Personal history of nicotine dependence: Secondary | ICD-10-CM | POA: Diagnosis not present

## 2022-05-26 DIAGNOSIS — Z881 Allergy status to other antibiotic agents status: Secondary | ICD-10-CM | POA: Diagnosis not present

## 2022-05-26 DIAGNOSIS — B009 Herpesviral infection, unspecified: Secondary | ICD-10-CM | POA: Diagnosis present

## 2022-05-26 DIAGNOSIS — Z79899 Other long term (current) drug therapy: Secondary | ICD-10-CM | POA: Diagnosis not present

## 2022-05-26 DIAGNOSIS — J45909 Unspecified asthma, uncomplicated: Secondary | ICD-10-CM | POA: Diagnosis present

## 2022-05-26 DIAGNOSIS — A6002 Herpesviral infection of other male genital organs: Secondary | ICD-10-CM | POA: Diagnosis present

## 2022-05-26 DIAGNOSIS — G629 Polyneuropathy, unspecified: Secondary | ICD-10-CM | POA: Diagnosis present

## 2022-05-26 DIAGNOSIS — Y838 Other surgical procedures as the cause of abnormal reaction of the patient, or of later complication, without mention of misadventure at the time of the procedure: Secondary | ICD-10-CM | POA: Diagnosis present

## 2022-05-26 DIAGNOSIS — Z885 Allergy status to narcotic agent status: Secondary | ICD-10-CM | POA: Diagnosis not present

## 2022-05-26 DIAGNOSIS — L0390 Cellulitis, unspecified: Secondary | ICD-10-CM

## 2022-05-26 DIAGNOSIS — M86671 Other chronic osteomyelitis, right ankle and foot: Secondary | ICD-10-CM | POA: Diagnosis present

## 2022-05-26 DIAGNOSIS — Z833 Family history of diabetes mellitus: Secondary | ICD-10-CM | POA: Diagnosis not present

## 2022-05-26 DIAGNOSIS — Z89421 Acquired absence of other right toe(s): Secondary | ICD-10-CM | POA: Diagnosis not present

## 2022-05-26 DIAGNOSIS — Z800 Family history of malignant neoplasm of digestive organs: Secondary | ICD-10-CM | POA: Diagnosis not present

## 2022-05-26 DIAGNOSIS — Z8249 Family history of ischemic heart disease and other diseases of the circulatory system: Secondary | ICD-10-CM | POA: Diagnosis not present

## 2022-05-26 DIAGNOSIS — F1910 Other psychoactive substance abuse, uncomplicated: Secondary | ICD-10-CM | POA: Diagnosis present

## 2022-05-26 DIAGNOSIS — I891 Lymphangitis: Secondary | ICD-10-CM | POA: Diagnosis present

## 2022-05-26 DIAGNOSIS — R000 Tachycardia, unspecified: Secondary | ICD-10-CM | POA: Diagnosis present

## 2022-05-26 DIAGNOSIS — Z808 Family history of malignant neoplasm of other organs or systems: Secondary | ICD-10-CM | POA: Diagnosis not present

## 2022-05-26 DIAGNOSIS — L03115 Cellulitis of right lower limb: Secondary | ICD-10-CM | POA: Diagnosis present

## 2022-05-26 DIAGNOSIS — Z8614 Personal history of Methicillin resistant Staphylococcus aureus infection: Secondary | ICD-10-CM | POA: Diagnosis not present

## 2022-05-26 DIAGNOSIS — L02611 Cutaneous abscess of right foot: Secondary | ICD-10-CM | POA: Diagnosis not present

## 2022-05-26 DIAGNOSIS — R042 Hemoptysis: Secondary | ICD-10-CM | POA: Diagnosis present

## 2022-05-26 LAB — URINALYSIS, ROUTINE W REFLEX MICROSCOPIC
Bilirubin Urine: NEGATIVE
Glucose, UA: NEGATIVE mg/dL
Hgb urine dipstick: NEGATIVE
Ketones, ur: NEGATIVE mg/dL
Leukocytes,Ua: NEGATIVE
Nitrite: NEGATIVE
Protein, ur: NEGATIVE mg/dL
Specific Gravity, Urine: 1.004 — ABNORMAL LOW (ref 1.005–1.030)
pH: 7 (ref 5.0–8.0)

## 2022-05-26 LAB — COMPREHENSIVE METABOLIC PANEL
ALT: 38 U/L (ref 0–44)
AST: 34 U/L (ref 15–41)
Albumin: 3.1 g/dL — ABNORMAL LOW (ref 3.5–5.0)
Alkaline Phosphatase: 55 U/L (ref 38–126)
Anion gap: 6 (ref 5–15)
BUN: 14 mg/dL (ref 6–20)
CO2: 24 mmol/L (ref 22–32)
Calcium: 8.2 mg/dL — ABNORMAL LOW (ref 8.9–10.3)
Chloride: 108 mmol/L (ref 98–111)
Creatinine, Ser: 1 mg/dL (ref 0.61–1.24)
GFR, Estimated: >60 mL/min (ref >60)
Glucose, Bld: 93 mg/dL (ref 70–99)
Potassium: 3.6 mmol/L (ref 3.5–5.1)
Sodium: 138 mmol/L (ref 135–145)
Total Bilirubin: 0.5 mg/dL (ref 0.3–1.2)
Total Protein: 5.9 g/dL — ABNORMAL LOW (ref 6.5–8.1)

## 2022-05-26 LAB — CBC
HCT: 37.7 % — ABNORMAL LOW (ref 39.0–52.0)
Hemoglobin: 12.1 g/dL — ABNORMAL LOW (ref 13.0–17.0)
MCH: 28.3 pg (ref 26.0–34.0)
MCHC: 32.1 g/dL (ref 30.0–36.0)
MCV: 88.3 fL (ref 80.0–100.0)
Platelets: 218 10*3/uL (ref 150–400)
RBC: 4.27 MIL/uL (ref 4.22–5.81)
RDW: 13.7 % (ref 11.5–15.5)
WBC: 6.3 10*3/uL (ref 4.0–10.5)
nRBC: 0 % (ref 0.0–0.2)

## 2022-05-26 LAB — BRAIN NATRIURETIC PEPTIDE: B Natriuretic Peptide: 27.4 pg/mL (ref 0.0–100.0)

## 2022-05-26 LAB — SEDIMENTATION RATE: Sed Rate: 16 mm/hr (ref 0–16)

## 2022-05-26 LAB — C-REACTIVE PROTEIN: CRP: 4.4 mg/dL — ABNORMAL HIGH (ref <1.0)

## 2022-05-26 LAB — MRSA NEXT GEN BY PCR, NASAL: MRSA by PCR Next Gen: DETECTED — AB

## 2022-05-26 MED ORDER — KETOROLAC TROMETHAMINE 15 MG/ML IJ SOLN
15.0000 mg | Freq: Four times a day (QID) | INTRAMUSCULAR | Status: DC | PRN
  Administered 2022-05-26 – 2022-05-28 (×5): 15 mg via INTRAVENOUS
  Filled 2022-05-26 (×5): qty 1

## 2022-05-26 MED ORDER — LORAZEPAM 2 MG/ML IJ SOLN
1.0000 mg | INTRAMUSCULAR | Status: AC
  Administered 2022-05-26: 1 mg via INTRAVENOUS
  Filled 2022-05-26: qty 1

## 2022-05-26 MED ORDER — GADOBUTROL 1 MMOL/ML IV SOLN
9.0000 mL | Freq: Once | INTRAVENOUS | Status: AC | PRN
  Administered 2022-05-26: 9 mL via INTRAVENOUS

## 2022-05-26 MED ORDER — MUPIROCIN 2 % EX OINT
1.0000 | TOPICAL_OINTMENT | Freq: Two times a day (BID) | CUTANEOUS | Status: DC
  Administered 2022-05-26 – 2022-05-28 (×3): 1 via NASAL
  Filled 2022-05-26 (×2): qty 22

## 2022-05-26 MED ORDER — CHLORHEXIDINE GLUCONATE CLOTH 2 % EX PADS: 6.0000 | MEDICATED_PAD | Freq: Once | CUTANEOUS | Status: AC

## 2022-05-26 MED ORDER — CHLORHEXIDINE GLUCONATE CLOTH 2 % EX PADS
6.0000 | MEDICATED_PAD | Freq: Once | CUTANEOUS | Status: AC
  Administered 2022-05-26: 6 via TOPICAL

## 2022-05-26 NOTE — ED Notes (Signed)
Pt's wife given a recliner for comfort, pt given 2 cokes to drink, wife has order herself and pt food as pt has diet order and may eat, VSS, no needs or concerns at this time, inpatient provider Dr. Trilby Drummer just left the room after providing pt and wife update of POC. Side rail up x2 for safety, call bell in reach, pt remains on cardiac monitor.

## 2022-05-26 NOTE — ED Notes (Signed)
Pt appears to be sleeping, observe even RR and unlabored, NAD noted, side rails up x2 for safety, plan of care ongoing, call light within reach, no further concerns as of present.  Wife appears asleep at the bedside in recliner even RR noted.
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
## 2022-05-26 NOTE — ED Notes (Signed)
Pt continues to rest with eyes closed, this RN can observe unlabored & even RR , VSS, NAD noted, side rails up x2 for safety, plan of care ongoing, call light within reach, no further concerns as of present.  Wife resting at the bedside in recliner.

## 2022-05-26 NOTE — Plan of Care (Signed)
  Problem: Coping: Goal: Level of anxiety will decrease Outcome: Progressing   Problem: Pain Managment: Goal: General experience of comfort will improve Outcome: Progressing   Problem: Safety: Goal: Ability to remain free from injury will improve Outcome: Progressing   

## 2022-05-26 NOTE — ED Notes (Signed)
Pt appears to be sleeping, observe even RR and unlabored, NAD noted, side rails up x2 for safety, plan of care ongoing, call light within reach, no further concerns as of present.   

## 2022-05-26 NOTE — Progress Notes (Signed)
MRI reviewed.  As able to contact the patient to discuss this via the phone.  Discussed with him surgery tomorrow including I&D and possible revision transmetatarsal potation.  I discussed the procedure with him and he wants to proceed.  Surgery will likely be around 4:00 or after tomorrow so we will do n.p.o. after 8 AM.  I will discuss with Dr. Loel Lofty is on call for [place] tomorrow. The patient had no further questions or concerns.

## 2022-05-26 NOTE — Progress Notes (Signed)
PROGRESS NOTE    George Shelton  HEN:[id number] DOB: [birth year] DOA: 05/25/2022 PCP: Oneita Hurt, No    Brief Narrative:   George Shelton is a 41 y.o. male with past medical history significant for right foot osteomyelitis s/p TMA, HSV, history of MRSA, recurrent cellulitis, substance abuse, who presented to [facility] ED on 10/24 with pain to his right foot associated with redness and swelling.  Reports onset roughly 2 days prior to admission with notable streaking up his right leg.  Also reports drainage from his right foot.  Denies fever, no chills, no chest pain, no abdominal pain, no nausea/vomiting/diarrhea.  Recently admitted by the surgical service for right groin infection s/p I&D and treated with a 7-day course of Zyvox outpatient.  Patient reports completion of antibiotics without any missed doses.  In the ED, temperature 98.5 F, HR 109, RR 24, BP 155/91, SPO2 97% on room air.  WBC 9.2, hemoglobin 11.7, platelet count 225.  Sodium 136, potassium 3.5, chloride 105, CO2 24, glucose 105, BUN 22, creatinine 1.43.  AST 35, ALT 37, total bilirubin 0.6.  Lactic acid 1.2.  Urinalysis unrevealing.  MRSA PCR detected.  Chest x-ray with no active cardiopulmonary disease process.  Right foot with no radiographic findings of osteomyelitis.  Patient received IV fluids in the ED and started on antibiotics.  Hospital service consulted for admission for further evaluation and treatment of right foot cellulitis and concern for abscess.  Assessment & Plan:   Right lower extremity cellulitis, concern for abscess Patient presenting to ED with 2-day history prior to admission of swelling, erythema, pain to right foot with associated drainage surrounding previous TMA site.  Patient is afebrile without leukocytosis, but CRP elevated 4.4.  MR right foot with and without contrast with findings of new complex fluid collections plantar to distal aspects first and fifth metatarsals and with findings suspicious for chronic  osteomyelitis. --Podiatry following, appreciate assistance --Ceftriaxone 1 g IV every 24 hours --Acetaminophen/Toradol as needed pain control --N.p.o. after midnight, for possible I&D and revision of TMA site if needed tomorrow  Hx HSV --Continue home Valtrex 500 mg p.o. daily  Asthma Not in acute exacerbation --Pulmicort neb twice daily --Albuterol neb every 6 hours as needed wheezing/shortness of breath continue home Flovent and albuterol as needed.  Neuropathic pain --Gabapentin 600 mg p.o. twice daily   DVT prophylaxis: enoxaparin (LOVENOX) injection 40 mg Start: 05/25/22 2315    Code Status: Full Code Family Communication: Updated family present at bedside this morning  Disposition Plan:  Level of care: Med-Surg Status is: Inpatient Remains inpatient appropriate because: IV antibiotics, pending surgical intervention by podiatry tomorrow    Consultants:  Podiatry, Dr. Ardelle Anton  Procedures:  None  Antimicrobials:  Ceftriaxone 10/24>>   Subjective: Patient seen examined bedside, resting comfortably.  Remains in ED holding area.  Continues to report drainage from right foot associated with pain and redness as well as swelling.  Discussed obtaining MRI of his foot to further evaluate for deeper tissue infection versus bone involvement.  Consulted podiatry for further evaluation.  No other specific questions or concerns at this time.  Updated family present at bedside.  Denies headache, no dizziness, no fever/chills/night sweats, no nausea/vomiting/diarrhea, no chest pain, no palpitations, no shortness of breath, no abdominal pain, no focal weakness, no fatigue, no paresthesias.  No acute events overnight per nursing staff.  Objective: Vitals:   05/26/22 0926 05/26/22 1033 05/26/22 1507 05/26/22 1600  BP:  122/68 (!) 141/82 135/83  Pulse:  96 82  83  Resp:  16 14 14   Temp: 98.6 F (37 C) 98.5 F (36.9 C) 98.2 F (36.8 C)   TempSrc:   Oral   SpO2:  98% 100% 100%   Weight:      Height:        Intake/Output Summary (Last 24 hours) at 05/26/2022 1646 Last data filed at 05/26/2022 0523 Gross per 24 hour  Intake 1100 ml  Output --  Net 1100 ml   Filed Weights   05/25/22 2007  Weight: 88.5 kg    Examination:  Physical Exam: GEN: NAD, alert and oriented x 3, wd/wn HEENT: NCAT, PERRL, EOMI, sclera clear, MMM PULM: CTAB w/o wheezes/crackles, normal respiratory effort, on room air CV: RRR w/o M/G/R GI: abd soft, NTND, NABS, no R/G/M MSK: no peripheral edema, muscle strength globally intact 5/5 bilateral upper/lower extremities NEURO: CN II-XII intact, no focal deficits, sensation to light touch intact PSYCH: normal mood/affect Integumentary: Right foot with prior TMA incision site noted with scab noted to medial aspect with mild erythema/edema, otherwise no other concerning rashes/lesions/wounds noted on exposed skin surfaces.  Dry/intact, no rashes or wounds       Data Reviewed: I have personally reviewed following labs and imaging studies  CBC: Recent Labs  Lab 05/25/22 2043 05/26/22 0500  WBC 9.2 6.3  NEUTROABS 7.8*  --   HGB 11.7* 12.1*  HCT 35.9* 37.7*  MCV 88.4 88.3  PLT 225 218   Basic Metabolic Panel: Recent Labs  Lab 05/25/22 2043 05/26/22 0500  NA 136 138  K 3.5 3.6  CL 105 108  CO2 24 24  GLUCOSE 105* 93  BUN 22* 14  CREATININE 1.43* 1.00  CALCIUM 8.6* 8.2*   GFR: Estimated Creatinine Clearance: 108.8 mL/min (by C-G formula based on SCr of 1 mg/dL). Liver Function Tests: Recent Labs  Lab 05/25/22 2043 05/26/22 0500  AST 35 34  ALT 37 38  ALKPHOS 66 55  BILITOT 0.6 0.5  PROT 6.2* 5.9*  ALBUMIN 3.4* 3.1*   No results for input(s): "LIPASE", "AMYLASE" in the last 168 hours. No results for input(s): "AMMONIA" in the last 168 hours. Coagulation Profile: Recent Labs  Lab 05/25/22 2043  INR 0.9   Cardiac Enzymes: No results for input(s): "CKTOTAL", "CKMB", "CKMBINDEX", "TROPONINI" in the last 168  hours. BNP (last 3 results) No results for input(s): "PROBNP" in the last 8760 hours. HbA1C: No results for input(s): "HGBA1C" in the last 72 hours. CBG: No results for input(s): "GLUCAP" in the last 168 hours. Lipid Profile: No results for input(s): "CHOL", "HDL", "LDLCALC", "TRIG", "CHOLHDL", "LDLDIRECT" in the last 72 hours. Thyroid Function Tests: No results for input(s): "TSH", "T4TOTAL", "FREET4", "T3FREE", "THYROIDAB" in the last 72 hours. Anemia Panel: No results for input(s): "VITAMINB12", "FOLATE", "FERRITIN", "TIBC", "IRON", "RETICCTPCT" in the last 72 hours. Sepsis Labs: Recent Labs  Lab 05/25/22 2043  LATICACIDVEN 1.2    Recent Results (from the past 240 hour(s))  Blood Culture (routine x 2)     Status: None (Preliminary result)   Collection Time: 05/25/22  8:43 PM   Specimen: BLOOD  Result Value Ref Range Status   Specimen Description   Final    BLOOD BLOOD LEFT FOREARM Performed at [facility], [address] [place], [place]    Special Requests   Final    Blood Culture results may not be optimal due to an excessive volume of blood received in culture bottles BOTTLES DRAWN AEROBIC AND ANAEROBIC Performed at  [facility], [address] [place], [place] [address]    Culture   Final    NO GROWTH < 12 HOURS Performed at [facility], [address] [place], [place] [address]    Report Status PENDING  Incomplete  Blood Culture (routine x 2)     Status: None (Preliminary result)   Collection Time: 05/25/22  9:01 PM   Specimen: BLOOD  Result Value Ref Range Status   Specimen Description   Final    BLOOD LEFT ANTECUBITAL Performed at [facility], [address] [place], [place] [address]    Special Requests   Final    Blood Culture results may not be optimal due to an excessive volume of blood received in culture bottles BOTTLES DRAWN AEROBIC AND ANAEROBIC Performed at [facility], [address], [place], [place] [address]    Culture   Final    NO GROWTH < 12 HOURS Performed at [facility], [address], [place], [place] [address]    Report Status PENDING  Incomplete  MRSA Next Gen by PCR, Nasal     Status: Abnormal   Collection Time: 05/25/22 11:20 PM   Specimen: Nasal Mucosa; Nasal Swab  Result Value Ref Range Status   MRSA by PCR Next Gen DETECTED (A) NOT DETECTED Final    Comment: (NOTE) The GeneXpert MRSA Assay (FDA approved for NASAL specimens only), is one component of a comprehensive MRSA colonization surveillance program. It is not intended to diagnose MRSA infection nor to guide or monitor treatment for MRSA infections. Test performance is not FDA approved in patients less than 30 years old. Performed at [facility], [address], [place], [place] [address]          Radiology Studies: MR FOOT RIGHT W WO CONTRAST  Result Date: 05/26/2022 CLINICAL DATA:  Foot swelling, nondiabetic, osteomyelitis suspected. Transmetatarsal foot amputation between radiographs obtained 12/14/2021 and 01/25/2022. EXAM: MRI OF THE RIGHT FOREFOOT WITHOUT AND WITH CONTRAST TECHNIQUE: Multiplanar, multisequence MR imaging of the right forefoot was performed before and after the administration of intravenous contrast. CONTRAST:  43mL GADAVIST GADOBUTROL 1 MMOL/ML IV SOLN COMPARISON:  Radiographs 05/25/2022 and 03/15/2022.  MRI 03/15/2022. FINDINGS: Bones/Joint/Cartilage Postsurgical changes from transmetatarsal amputations are unchanged from the previous MRI 10 weeks ago. No progressive cortical destruction identified. There is persistent marrow T2 hyperintensity throughout the remaining 1st through 5th metatarsals, sparing the bases, not significantly changed from previous MRI low level marrow enhancement is present in these areas following contrast. The cuneiform bones, cuboid and navicular appear unremarkable. Stable small  osteochondral lesion of the talar head. No significant joint effusions. Ligaments Intact Lisfranc ligament. Muscles and Tendons T2 hyperintensity throughout the forefoot musculature has mildly improved from the previous study. No focal intramuscular fluid collections or significant tenosynovitis. Postsurgical changes within the flexor and extensor tendons related to the previous transmetatarsal amputation. Soft tissues Previously demonstrated ill-defined fluid collections surrounding the distal 2nd and 3rd metatarsals have decreased in size compared with the previous MRI. There is associated soft tissue enhancement surrounding 2nd and 3rd metatarsals. No skin ulceration is apparent. There are complex fluid collections plantar to the distal aspects of the remaining 1st and 5th metatarsals. The 5th metatarsal collection measures up to 1.7 cm on coronal image 20/6 and does not show significant abnormal associated enhancement. There is a small collection plantar to the 1st metatarsal which does show peripheral enhancement. There is generally poor  soft tissue enhancement within the lateral aspect of the remaining forefoot, suspicious for soft tissue devitalization. IMPRESSION: 1. Transmetatarsal amputation approximately 5 months ago. Compared with previous MRI of 03/15/2022, there is no significant change in marrow T2 hyperintensity and enhancement within the remaining 1st through 5th metatarsals. These findings are nonspecific but suspicious for chronic osteomyelitis. No progressive cortical destruction identified. 2. New complex fluid collections plantar to the distal aspects of the remaining 1st and 5th metatarsals. The medial component shows peripheral enhancement and could reflect a small abscess, especially if there is a skin wound in this area. The larger lateral component does not show any abnormal enhancement, although there is limited overall soft tissue enhancement laterally in the remaining forefoot,  suspicious for underlying soft tissue devitalization. 3. Interval decrease in size of ill-defined fluid collections surrounding the distal 2nd and 3rd metatarsals with associated soft tissue enhancement. Electronically Signed   By: Richardean Sale M.D.   On: 05/26/2022 15:06   DG Foot Complete Right  Result Date: 05/25/2022 CLINICAL DATA:  Questionable sepsis - evaluate for abnormality EXAM: RIGHT FOOT COMPLETE - 3+ VIEW COMPARISON:  X-ray right foot 03/15/2022 FINDINGS: Status post amputation of all toes at the level of distal metatarsal. No cortical erosion or destruction. There is no evidence of fracture or dislocation. There is no evidence of arthropathy or other focal bone abnormality. Soft tissues are unremarkable. IMPRESSION: 1. No radiographic finding of osteomyelitis. If high clinical suspicion, please consider MRI (with intravenous contrast if GFR greater than 30) for further evaluation. 2. Status post amputation of all toes at the level of distal metatarsal. Electronically Signed   By: Iven Finn M.D.   On: 05/25/2022 21:00   DG Chest Port 1 View  Result Date: 05/25/2022 CLINICAL DATA:  Questionable sepsis.  Erythema in the leg. EXAM: PORTABLE CHEST 1 VIEW COMPARISON:  None Available. FINDINGS: The heart size and mediastinal contours are within normal limits. Both lungs are clear. The visualized skeletal structures are unremarkable. IMPRESSION: No active disease. Electronically Signed   By: San Morelle M.D.   On: 05/25/2022 20:57        Scheduled Meds:  amphetamine-dextroamphetamine  20 mg Oral BID   budesonide (PULMICORT) nebulizer solution  0.25 mg Nebulization BID   enoxaparin (LOVENOX) injection  40 mg Subcutaneous QHS   gabapentin  600 mg Oral BID   nebivolol  10 mg Oral Daily   sodium chloride flush  3 mL Intravenous Q12H   valACYclovir  500 mg Oral Daily   Continuous Infusions:  cefTRIAXone (ROCEPHIN)  IV Stopped (05/25/22 2350)     LOS: 0 days    Time  spent: 51 minutes spent on chart review, discussion with nursing staff, consultants, updating family and interview/physical exam; more than 50% of that time was spent in counseling and/or coordination of care.    Maxxwell Edgett J [place] DO [facility] Available via Epic secure chat 7am-7pm After these hours, please refer to coverage provider listed on [email or web address] 05/26/2022, 4:46 PM

## 2022-05-26 NOTE — Progress Notes (Signed)
Pt very groggy and unable to answer questions on nursing admission history at this time. Pt's rn states that he has been given Ativan. Unable to complete nursing admission history at this time. Obtained available information from chart. Doroteo Bradford BSN, RN-BC Throughput Nurse 05/26/2022 3:34 PM

## 2022-05-27 ENCOUNTER — Inpatient Hospital Stay (HOSPITAL_COMMUNITY): Payer: BC Managed Care – PPO | Admitting: Anesthesiology

## 2022-05-27 ENCOUNTER — Encounter (HOSPITAL_COMMUNITY): Payer: Self-pay | Admitting: Internal Medicine

## 2022-05-27 ENCOUNTER — Inpatient Hospital Stay (HOSPITAL_COMMUNITY): Payer: BC Managed Care – PPO

## 2022-05-27 ENCOUNTER — Other Ambulatory Visit: Payer: Self-pay

## 2022-05-27 ENCOUNTER — Encounter (HOSPITAL_COMMUNITY)
Admission: EM | Disposition: A | Discharge status: Home / Self Care | Payer: Self-pay | Source: Home / Self Care | Attending: Internal Medicine

## 2022-05-27 DIAGNOSIS — L02611 Cutaneous abscess of right foot: Secondary | ICD-10-CM

## 2022-05-27 DIAGNOSIS — I891 Lymphangitis: Secondary | ICD-10-CM | POA: Diagnosis not present

## 2022-05-27 DIAGNOSIS — M86671 Other chronic osteomyelitis, right ankle and foot: Secondary | ICD-10-CM

## 2022-05-27 HISTORY — PX: INCISION AND DRAINAGE ABSCESS: SHX5864

## 2022-05-27 LAB — BASIC METABOLIC PANEL
Anion gap: 6 (ref 5–15)
BUN: 11 mg/dL (ref 6–20)
CO2: 25 mmol/L (ref 22–32)
Calcium: 8.6 mg/dL — ABNORMAL LOW (ref 8.9–10.3)
Chloride: 109 mmol/L (ref 98–111)
Creatinine, Ser: 0.98 mg/dL (ref 0.61–1.24)
GFR, Estimated: >60 mL/min (ref >60)
Glucose, Bld: 94 mg/dL (ref 70–99)
Potassium: 4.2 mmol/L (ref 3.5–5.1)
Sodium: 140 mmol/L (ref 135–145)

## 2022-05-27 LAB — CBC
HCT: 39.3 % (ref 39.0–52.0)
Hemoglobin: 12.9 g/dL — ABNORMAL LOW (ref 13.0–17.0)
MCH: 28.6 pg (ref 26.0–34.0)
MCHC: 32.8 g/dL (ref 30.0–36.0)
MCV: 87.1 fL (ref 80.0–100.0)
Platelets: 246 10*3/uL (ref 150–400)
RBC: 4.51 MIL/uL (ref 4.22–5.81)
RDW: 13.7 % (ref 11.5–15.5)
WBC: 5.4 10*3/uL (ref 4.0–10.5)
nRBC: 0 % (ref 0.0–0.2)

## 2022-05-27 LAB — URINE CULTURE: Culture: NO GROWTH

## 2022-05-27 SURGERY — INCISION AND DRAINAGE, ABSCESS
Anesthesia: General | Site: Foot | Laterality: Right

## 2022-05-27 MED ORDER — TRAMADOL HCL 50 MG PO TABS
50.0000 mg | ORAL_TABLET | Freq: Once | ORAL | Status: AC
  Administered 2022-05-27: 50 mg via ORAL
  Filled 2022-05-27: qty 1

## 2022-05-27 MED ORDER — LIDOCAINE 2% (20 MG/ML) 5 ML SYRINGE
INTRAMUSCULAR | Status: DC | PRN
  Administered 2022-05-27: 100 mg via INTRAVENOUS

## 2022-05-27 MED ORDER — FENTANYL CITRATE (PF) 100 MCG/2ML IJ SOLN
INTRAMUSCULAR | Status: AC
  Filled 2022-05-27: qty 2

## 2022-05-27 MED ORDER — LORAZEPAM 2 MG/ML IJ SOLN
1.0000 mg | INTRAMUSCULAR | Status: AC
  Administered 2022-05-27: 1 mg via INTRAVENOUS
  Filled 2022-05-27: qty 1

## 2022-05-27 MED ORDER — PROPOFOL 10 MG/ML IV BOLUS
INTRAVENOUS | Status: AC
  Filled 2022-05-27: qty 20

## 2022-05-27 MED ORDER — DEXAMETHASONE SODIUM PHOSPHATE 10 MG/ML IJ SOLN
INTRAMUSCULAR | Status: AC
  Filled 2022-05-27: qty 1

## 2022-05-27 MED ORDER — ALPRAZOLAM 1 MG PO TABS: 1.0000 mg | ORAL_TABLET | Freq: Once | ORAL | Status: DC | PRN

## 2022-05-27 MED ORDER — BUPIVACAINE-EPINEPHRINE (PF) 0.25% -1:200000 IJ SOLN
INTRAMUSCULAR | Status: AC
  Filled 2022-05-27: qty 30

## 2022-05-27 MED ORDER — PROPOFOL 10 MG/ML IV BOLUS
INTRAVENOUS | Status: DC | PRN
  Administered 2022-05-27: 200 mg via INTRAVENOUS

## 2022-05-27 MED ORDER — MIDAZOLAM HCL 2 MG/2ML IJ SOLN
INTRAMUSCULAR | Status: AC
  Filled 2022-05-27: qty 2

## 2022-05-27 MED ORDER — ONDANSETRON HCL 4 MG/2ML IJ SOLN
INTRAMUSCULAR | Status: DC | PRN
  Administered 2022-05-27: 4 mg via INTRAVENOUS

## 2022-05-27 MED ORDER — BUPIVACAINE HCL (PF) 0.25 % IJ SOLN
INTRAMUSCULAR | Status: DC | PRN
  Administered 2022-05-27: 20 mL

## 2022-05-27 MED ORDER — LACTATED RINGERS IV SOLN: INTRAVENOUS | Status: DC | PRN

## 2022-05-27 MED ORDER — BUPIVACAINE HCL 0.25 % IJ SOLN
INTRAMUSCULAR | Status: AC
  Filled 2022-05-27: qty 1

## 2022-05-27 MED ORDER — LABETALOL HCL 5 MG/ML IV SOLN
INTRAVENOUS | Status: DC | PRN
  Administered 2022-05-27: 5 mg via INTRAVENOUS

## 2022-05-27 MED ORDER — DEXMEDETOMIDINE HCL IN NACL 80 MCG/20ML IV SOLN
INTRAVENOUS | Status: DC | PRN
  Administered 2022-05-27 (×2): 12 ug via BUCCAL

## 2022-05-27 MED ORDER — LABETALOL HCL 5 MG/ML IV SOLN
INTRAVENOUS | Status: AC
  Filled 2022-05-27: qty 4

## 2022-05-27 MED ORDER — LORAZEPAM 2 MG/ML IJ SOLN
1.0000 mg | Freq: Once | INTRAMUSCULAR | Status: AC
  Administered 2022-05-27: 1 mg via INTRAVENOUS
  Filled 2022-05-27: qty 0.5

## 2022-05-27 MED ORDER — PROPOFOL 500 MG/50ML IV EMUL
INTRAVENOUS | Status: DC | PRN
  Administered 2022-05-27: 200 ug/kg/min via INTRAVENOUS

## 2022-05-27 MED ORDER — MIDAZOLAM HCL 5 MG/5ML IJ SOLN
INTRAMUSCULAR | Status: DC | PRN
  Administered 2022-05-27: 2 mg via INTRAVENOUS

## 2022-05-27 MED ORDER — DEXMEDETOMIDINE HCL IN NACL 80 MCG/20ML IV SOLN
INTRAVENOUS | Status: AC
  Filled 2022-05-27: qty 20

## 2022-05-27 MED ORDER — ONDANSETRON HCL 4 MG/2ML IJ SOLN
INTRAMUSCULAR | Status: AC
  Filled 2022-05-27: qty 2

## 2022-05-27 MED ORDER — DEXAMETHASONE SODIUM PHOSPHATE 10 MG/ML IJ SOLN
INTRAMUSCULAR | Status: DC | PRN
  Administered 2022-05-27: 4 mg via INTRAVENOUS

## 2022-05-27 MED ORDER — OXYCODONE HCL 5 MG PO TABS: 5.0000 mg | ORAL_TABLET | Freq: Once | ORAL | Status: DC

## 2022-05-27 MED ORDER — PROPOFOL 1000 MG/100ML IV EMUL
INTRAVENOUS | Status: AC
  Filled 2022-05-27: qty 100

## 2022-05-27 SURGICAL SUPPLY — 43 items
BLADE SURG 15 STRL LF DISP TIS (BLADE) ×1 IMPLANT
BLADE SURG 15 STRL SS (BLADE) ×2
BNDG ELASTIC 3X5.8 VLCR STR LF (GAUZE/BANDAGES/DRESSINGS) ×1 IMPLANT
BNDG ELASTIC 4X5.8 VLCR STR LF (GAUZE/BANDAGES/DRESSINGS) ×2 IMPLANT
BNDG ESMARK 4X9 LF (GAUZE/BANDAGES/DRESSINGS) ×2 IMPLANT
BNDG GAUZE DERMACEA FLUFF 4 (GAUZE/BANDAGES/DRESSINGS) ×2 IMPLANT
CHLORAPREP W/TINT 26 (MISCELLANEOUS) ×2 IMPLANT
CNTNR URN SCR LID CUP LEK RST (MISCELLANEOUS) ×2 IMPLANT
CONT SPEC 4OZ STRL OR WHT (MISCELLANEOUS) ×2
DRAPE 3/4 80X56 (DRAPES) ×2 IMPLANT
DRAPE EXTREMITY T 121X128X90 (DISPOSABLE) ×2 IMPLANT
DRAPE IMP U-DRAPE 54X76 (DRAPES) ×1 IMPLANT
DRAPE SHEET LG 3/4 BI-LAMINATE (DRAPES) ×2 IMPLANT
DRSG ADAPTIC 3X8 NADH LF (GAUZE/BANDAGES/DRESSINGS) ×1 IMPLANT
ELECT REM PT RETURN 15FT ADLT (MISCELLANEOUS) ×2 IMPLANT
GAUZE SPONGE 4X4 12PLY STRL (GAUZE/BANDAGES/DRESSINGS) ×1 IMPLANT
GAUZE XEROFORM 1X8 LF (GAUZE/BANDAGES/DRESSINGS) ×1 IMPLANT
GLOVE BIOGEL PI IND STRL 8 (GLOVE) ×2 IMPLANT
GLOVE ECLIPSE 8.0 STRL XLNG CF (GLOVE) ×2 IMPLANT
GOWN STRL REUS W/ TWL XL LVL3 (GOWN DISPOSABLE) ×2 IMPLANT
GOWN STRL REUS W/TWL XL LVL3 (GOWN DISPOSABLE) ×2
KIT BASIN OR (CUSTOM PROCEDURE TRAY) ×2 IMPLANT
KIT TURNOVER KIT A (KITS) IMPLANT
MANIFOLD NEPTUNE II (INSTRUMENTS) ×2 IMPLANT
NDL HYPO 25X1 1.5 SAFETY (NEEDLE) ×1 IMPLANT
NEEDLE HYPO 25X1 1.5 SAFETY (NEEDLE) ×2 IMPLANT
NS IRRIG 1000ML POUR BTL (IV SOLUTION) ×2 IMPLANT
PENCIL SMOKE EVACUATOR (MISCELLANEOUS) ×1 IMPLANT
SET IRRIG Y TYPE TUR BLADDER L (SET/KITS/TRAYS/PACK) ×2 IMPLANT
SPONGE T-LAP 4X18 ~~LOC~~+RFID (SPONGE) ×2 IMPLANT
STAPLER VISISTAT 35W (STAPLE) ×1 IMPLANT
SUCTION FRAZIER HANDLE 10FR (MISCELLANEOUS) ×2
SUCTION TUBE FRAZIER 10FR DISP (MISCELLANEOUS) ×2 IMPLANT
SUT ETHILON 3 0 PS 1 (SUTURE) ×1 IMPLANT
SUT ETHILON 4 0 PS 2 18 (SUTURE) ×1 IMPLANT
SUT MNCRL AB 3-0 PS2 18 (SUTURE) ×1 IMPLANT
SUT MNCRL AB 4-0 PS2 18 (SUTURE) ×1 IMPLANT
SUT PROLENE 3 0 PS 2 (SUTURE) ×3 IMPLANT
SUT VIC AB 2-0 SH 27 (SUTURE)
SUT VIC AB 2-0 SH 27XBRD (SUTURE) ×1 IMPLANT
SYR BULB EAR ULCER 3OZ GRN STR (SYRINGE) ×2 IMPLANT
SYR CONTROL 10ML LL (SYRINGE) ×2 IMPLANT
YANKAUER SUCT BULB TIP 10FT TU (MISCELLANEOUS) ×2 IMPLANT

## 2022-05-27 NOTE — Progress Notes (Addendum)
PODIATRY PROGRESS NOTE Patient Name: George Shelton  DOB [birth year] DOA 05/25/2022  Hospital Day: 3  Assessment:  41 y.o. male with history of right foot osteomyelitis s/p TMA, HSV, history of MRSA, recurrent cellulitis, substance abuse. Here for R foot cellulitis and MRI with concern for abscess at the TMA site and possible residual chronic OM of the distal metatarsal stumps.   AF, VSS  WBC: 5.4 ESR/CRP: 16/4.4  Wound/Bone Cultures: pending OR  Imaging:  MRI R Foot 1. Transmetatarsal amputation approximately 5 months ago. Compared with previous MRI of 03/15/2022, there is no significant change in marrow T2 hyperintensity and enhancement within the remaining 1st through 5th metatarsals. These findings are nonspecific but suspicious for chronic osteomyelitis. No progressive cortical destruction identified. 2. New complex fluid collections plantar to the distal aspects of the remaining 1st and 5th metatarsals. The medial component shows peripheral enhancement and could reflect a small abscess, especially if there is a skin wound in this area. The larger lateral component does not show any abnormal enhancement, although there is limited overall soft tissue enhancement laterally in the remaining forefoot, suspicious for underlying soft tissue devitalization.  3. Interval decrease in size of ill-defined fluid collections surrounding the distal 2nd and 3rd metatarsals with associated soft tissue enhancement.  Plan:  - NPO for OR today for I&D of possible small abscess medial and lateral TMA amputation site. Will likely take bone biopsy 1st and 5th met head also for path/culture.  - Continue IV abx pending further culture data - Ok to restart anticoagulation per primary post op Will continue to follow        Everitt Amber, DPM [facility]    Subjective:  Patient seen in pre op area. Aware of plans for surgery. Wants as little done as needs to be. Does not want  narcotic pain medication. Thinks redness and swelling in right foot have improved since admission.   Objective:   Vitals:   05/27/22 0911 05/27/22 1519  BP: (!) 154/80 (!) 150/97  Pulse: 94 77  Resp: 16 16  Temp: 98.2 F (36.8 C) 98.3 F (36.8 C)  SpO2: 100% 100%       Latest Ref Rng & Units 05/27/2022    3:10 AM 05/26/2022    5:00 AM 05/25/2022    8:43 PM  CBC  WBC 4.0 - 10.5 K/uL 5.4  6.3  9.2   Hemoglobin 13.0 - 17.0 g/dL 12.9  12.1  11.7   Hematocrit 39.0 - 52.0 % 39.3  37.7  35.9   Platelets 150 - 400 K/uL 246  218  225        Latest Ref Rng & Units 05/27/2022    3:10 AM 05/26/2022    5:00 AM 05/25/2022    8:43 PM  BMP  Glucose 70 - 99 mg/dL 94  93  105   BUN 6 - 20 mg/dL '11  14  22   ' Creatinine 0.61 - 1.24 mg/dL 0.98  1.00  1.43   Sodium 135 - 145 mmol/L 140  138  136   Potassium 3.5 - 5.1 mmol/L 4.2  3.6  3.5   Chloride 98 - 111 mmol/L 109  108  105   CO2 22 - 32 mmol/L '25  24  24   ' Calcium 8.9 - 10.3 mg/dL 8.6  8.2  8.6     General: AAOx3, NAD  Lower Extremity Exam Dermatological: Incision from transmetatarsal potation site appears to be healed however there  is some scabbing noted along the incision.  There is some fullness noted on the central aspect along the TMA site.  There is no crepitation.  Decreased edema and erythema present but the ascending status appears to be resolved at this point.   Vascular: Dorsalis Pedis artery and Posterior Tibial artery pedal pulses are 2/4 bilateral with immedate capillary fill time. There is no pain with calf compression, swelling, warmth, erythema.    Neruologic: Grossly diminished via light touch bilateral.    Musculoskeletal: Tenderness along the transmetatarsal amputation site.     Radiology:  Results reviewed. See assessment for pertinent imaging results

## 2022-05-27 NOTE — Anesthesia Procedure Notes (Signed)
Procedure Name: LMA Insertion Date/Time: 05/27/2022 4:55 PM  Performed by: Jenne Campus, CRNAPre-anesthesia Checklist: Patient identified, Emergency Drugs available, Suction available and Patient being monitored Patient Re-evaluated:Patient Re-evaluated prior to induction Oxygen Delivery Method: Circle System Utilized Preoxygenation: Pre-oxygenation with 100% oxygen Induction Type: IV induction Ventilation: Mask ventilation without difficulty LMA: LMA inserted LMA Size: 4.0 Number of attempts: 1 Airway Equipment and Method: Bite block Placement Confirmation: positive ETCO2 and breath sounds checked- equal and bilateral Tube secured with: Tape Dental Injury: Teeth and Oropharynx as per pre-operative assessment

## 2022-05-27 NOTE — Op Note (Signed)
Full Operative Report  Date of Operation: 8:13 PM, 05/27/2022   Patient: George Shelton - 41 y.o. male  Surgeon: Yevonne Pax, DPM   Assistant: None  Diagnosis: Right foot abscess, chronic osteomyelitis  Procedure:  1. Incision and drainage, TMA stump site medial lateral central, Right foot 2. Bone biopsy for pathology and culture, 1st and 5th metatarsal stump site, right foot    Anesthesia: General  No responsible provider has been recorded for the case.  Anesthesiologist: Josephine Igo, MD CRNA: Jenne Campus, CRNA   Estimated Blood Loss: Minimal   Hemostasis: 1) Anatomical dissection, mechanical compression, electrocautery 2) Ankle tourniquet inflated to 250 mmHg continuous 30 min  Implants: * No implants in log *  Materials: Prolene suture  Injectables: 1) Pre-operatively: 10 cc of 0.25% marcaine plain 2) Post-operatively: 10 cc of 0.25% marcaine plain  Specimens: deep tissue culture lateral TMA stump site. 1st metatarsal bone for pathology and for culture. 5th metatarsal bone for pathology and for culture.    Antibiotics: Abx given as scheduled from the floor.   Drains: None  Complications: Patient tolerated the procedure well without complication.   Findings: as below  Indications for Procedure: Dimitry Holsworth presents to Yevonne Pax, DPM with a chief complaint of streaking erythema to the right foot. History of TMA well healed. Recent groin abscess. MRI R foot with concern for abscess plantar 1st and 5th metatarsal stump sites. Erythema has improved with IV ceftriaxone but concern for residual abscess.  The patient has failed conservative treatments of various modalities. At this time the patient has elected to proceed with surgical correction. All alternatives, risks, and complications of the procedures were thoroughly explained to the patient. Patient exhibits appropriate understanding of all discussion points and informed  consent was signed and obtained in the chart with no guarantees to surgical outcome given or implied.  Description of Procedure: Patient was brought to the operating room and placed on the operative table in the supine position. Patient was secured to the table with safety belt, a contralateral SCD was placed, and all bony prominences were well padded. A surgical timeout was performed and all members of the operating room, the procedure, and the surgical site were identified. General anesthesia occurred. Local anesthetic as previously described was then injected about the operative field in a local infiltrative block.   A well padded pneumatic tourniquet was placed to the operative limb. The Right lower extremity was then prepped and draped in the usual sterile manner. The pneumatic tourniquet was inflated. The following procedure then began.  Attention was directed to the Right lower extremity. Using a #15 blade, a linear incision was made at the TMA amputation site medial lateral and central separated buy skin bridges in between. Sharp and blunt dissection was carried deep to subcutaneous tissues, being careful to protect all neurovascular structures. At all 3 sites no purulence was encountered. No evidence of abscess at any site indicated on MRI.  A deep tissue culture was obtained. Manual pressure was  applied but no purulence expressed. Using a hemostat, the subcutaneous tissue layers were explored for any sinus tracts, gross debris, and to free up any remaining fluid matter.   Bone biopsy 1st and 5th met stump sites via open incision was then performed. Soft tissue was cleared off the distal aspect of the 5th and 1st metatarsal stumps. A sagittal saw was then used to resect a 1 cm margin of bone from both sites. The bone was freed with scalpel  and passed to back table. Bone did not appear grossly necrotic or soft.  Pieces of this marginal bone was sent for culture and the rest for path for both the  1st and 5th met stump.  1080m of saline was used under power pulse lavage to irrigate the surgical site. The 3 incision sites was then closed with prolene suture.   The surgical site was then dressed with betadine adaptic 4x4 kerlix and ace. The tourniquet was deflated after 30 minutes with immediate vascular return noted to the operative foot. The patient tolerated both the procedure and anesthesia well with vital signs stable throughout. The patient was transferred from the OR to recovery under the discretion of anesthesia.  Condition: Patient was taken to PACU in good condition and all vital signs stable and neurovascular status intact to the operative limb.  Discharge: Pt will be readmitted to the floor for ongoing wound monitoring and IV abx per ID recs. Currently no further surgical plans this admission.   Andreal Vultaggio, ANena Sanii Kukla DPM will follow the patient throughout the entire post-operative course and the patient is aware of all post-operative protocols in place. The patient will follow the protocol of rest, ice, and elevation. The patient will be weightbearing in a post op shoe to the operative limb until further instructed. The dressing is to remain clean, dry, and intact.   AAmes Coupe DPM

## 2022-05-27 NOTE — Progress Notes (Addendum)
Patient ID: George Shelton, male   DOB: 1981/06/04, 41 y.o.   MRN: [id number]  MD order for NPO at 0800. Patient has food and drink in room from [place] brought by wife. Patient states he is going to eat his food now and would not allow tech to remove it. Will notify physician.   Haydee Salter, RN  House supervisor states to allow him to have hospital tray and notify physician.

## 2022-05-27 NOTE — Progress Notes (Signed)
PROGRESS NOTE    George Shelton  [id number] DOB: [birth year] DOA: 05/25/2022 PCP: Merryl Hacker, No    Brief Narrative:   George Shelton is a 41 y.o. male with past medical history significant for right foot osteomyelitis s/p TMA, HSV, history of MRSA, recurrent cellulitis, substance abuse, who presented to [facility] ED on 10/24 with pain to his right foot associated with redness and swelling.  Reports onset roughly 2 days prior to admission with notable streaking up his right leg.  Also reports drainage from his right foot.  Denies fever, no chills, no chest pain, no abdominal pain, no nausea/vomiting/diarrhea.  Recently admitted by the surgical service for right groin infection s/p I&D and treated with a 7-day course of Zyvox outpatient.  Patient reports completion of antibiotics without any missed doses.  In the ED, temperature 98.5 F, HR 109, RR 24, BP 155/91, SPO2 97% on room air.  WBC 9.2, hemoglobin 11.7, platelet count 225.  Sodium 136, potassium 3.5, chloride 105, CO2 24, glucose 105, BUN 22, creatinine 1.43.  AST 35, ALT 37, total bilirubin 0.6.  Lactic acid 1.2.  Urinalysis unrevealing.  MRSA PCR detected.  Chest x-ray with no active cardiopulmonary disease process.  Right foot with no radiographic findings of osteomyelitis.  Patient received IV fluids in the ED and started on antibiotics.  Hospital service consulted for admission for further evaluation and treatment of right foot cellulitis and concern for abscess.  Assessment & Plan:   Right lower extremity cellulitis, concern for abscess Patient presenting to ED with 2-day history prior to admission of swelling, erythema, pain to right foot with associated drainage surrounding previous TMA site.  Patient is afebrile without leukocytosis, but CRP elevated 4.4.  MR right foot with and without contrast with findings of new complex fluid collections plantar to distal aspects first and fifth metatarsals and with findings suspicious for chronic  osteomyelitis. --Podiatry following, appreciate assistance --Ceftriaxone 1 g IV every 24 hours --Acetaminophen/Toradol as needed pain control --N.p.o., Plan I&D and possible revision of TMA site today  Hx HSV --Continue home Valtrex 500 mg p.o. daily  Asthma Not in acute exacerbation --Pulmicort neb twice daily --Albuterol neb every 6 hours as needed wheezing/shortness of breath continue home Flovent and albuterol as needed.  Neuropathic pain --Gabapentin 600 mg p.o. twice daily   DVT prophylaxis: SCD's Start: 05/26/22 1822 enoxaparin (LOVENOX) injection 40 mg Start: 05/25/22 2315    Code Status: Full Code Family Communication: Updated spouse present at bedside this morning  Disposition Plan:  Level of care: Med-Surg Status is: Inpatient Remains inpatient appropriate because: IV antibiotics, pending surgical intervention by podiatry today    Consultants:  Podiatry, Dr. Jacqualyn Posey  Procedures:  None  Antimicrobials:  Ceftriaxone 10/24>>   Subjective: Patient seen examined bedside, resting comfortably.  Lying in bed with spouse.  Patient agitated this morning, states "ordered breakfast at 7 AM, and now cannot eat it because I am n.p.o.".  Also stated that he "may leave to go to another hospital".  Discussed with patient why he was n.p.o. as he is pending surgical intervention today.  No other specific questions or concerns at this time.  Updated family present at bedside.  Denies headache, no dizziness, no fever/chills/night sweats, no nausea/vomiting/diarrhea, no chest pain, no palpitations, no shortness of breath, no abdominal pain, no focal weakness, no fatigue, no paresthesias.  No acute events overnight per nursing staff.  Objective: Vitals:   05/27/22 0129 05/27/22 0647 05/27/22 0832 05/27/22 0911  BP: (!) 140/93 Marland Kitchen)  140/84  (!) 154/80  Pulse: 99 82  94  Resp: 18 18  16   Temp: 97.8 F (36.6 C) 98.1 F (36.7 C)  98.2 F (36.8 C)  TempSrc:    Oral  SpO2: 99% 100%  98% 100%  Weight:      Height:        Intake/Output Summary (Last 24 hours) at 05/27/2022 1013 Last data filed at 05/27/2022 0600 Gross per 24 hour  Intake 820 ml  Output --  Net 820 ml   Filed Weights   05/25/22 2007  Weight: 88.5 kg    Examination:  Physical Exam: GEN: NAD, alert and oriented x 3, wd/wn HEENT: NCAT, PERRL, EOMI, sclera clear, MMM PULM: CTAB w/o wheezes/crackles, normal respiratory effort, on room air CV: RRR w/o M/G/R GI: abd soft, NTND, NABS, no R/G/M MSK: no peripheral edema, muscle strength globally intact 5/5 bilateral upper/lower extremities NEURO: CN II-XII intact, no focal deficits, sensation to light touch intact PSYCH: normal mood/affect Integumentary: Right foot with prior TMA incision site noted with scab noted to medial aspect with mild erythema/edema, otherwise no other concerning rashes/lesions/wounds noted on exposed skin surfaces.  Dry/intact, no rashes or wounds       Data Reviewed: I have personally reviewed following labs and imaging studies  CBC: Recent Labs  Lab 05/25/22 2043 05/26/22 0500 05/27/22 0310  WBC 9.2 6.3 5.4  NEUTROABS 7.8*  --   --   HGB 11.7* 12.1* 12.9*  HCT 35.9* 37.7* 39.3  MCV 88.4 88.3 87.1  PLT 225 218 0000000   Basic Metabolic Panel: Recent Labs  Lab 05/25/22 2043 05/26/22 0500 05/27/22 0310  NA 136 138 140  K 3.5 3.6 4.2  CL 105 108 109  CO2 24 24 25   GLUCOSE 105* 93 94  BUN 22* 14 11  CREATININE 1.43* 1.00 0.98  CALCIUM 8.6* 8.2* 8.6*   GFR: Estimated Creatinine Clearance: 111 mL/min (by C-G formula based on SCr of 0.98 mg/dL). Liver Function Tests: Recent Labs  Lab 05/25/22 2043 05/26/22 0500  AST 35 34  ALT 37 38  ALKPHOS 66 55  BILITOT 0.6 0.5  PROT 6.2* 5.9*  ALBUMIN 3.4* 3.1*   No results for input(s): "LIPASE", "AMYLASE" in the last 168 hours. No results for input(s): "AMMONIA" in the last 168 hours. Coagulation Profile: Recent Labs  Lab 05/25/22 2043  INR 0.9    Cardiac Enzymes: No results for input(s): "CKTOTAL", "CKMB", "CKMBINDEX", "TROPONINI" in the last 168 hours. BNP (last 3 results) No results for input(s): "PROBNP" in the last 8760 hours. HbA1C: No results for input(s): "HGBA1C" in the last 72 hours. CBG: No results for input(s): "GLUCAP" in the last 168 hours. Lipid Profile: No results for input(s): "CHOL", "HDL", "LDLCALC", "TRIG", "CHOLHDL", "LDLDIRECT" in the last 72 hours. Thyroid Function Tests: No results for input(s): "TSH", "T4TOTAL", "FREET4", "T3FREE", "THYROIDAB" in the last 72 hours. Anemia Panel: No results for input(s): "VITAMINB12", "FOLATE", "FERRITIN", "TIBC", "IRON", "RETICCTPCT" in the last 72 hours. Sepsis Labs: Recent Labs  Lab 05/25/22 2043  LATICACIDVEN 1.2    Recent Results (from the past 240 hour(s))  Blood Culture (routine x 2)     Status: None (Preliminary result)   Collection Time: 05/25/22  8:43 PM   Specimen: BLOOD  Result Value Ref Range Status   Specimen Description   Final    BLOOD BLOOD LEFT FOREARM Performed at [facility], [place] [address] [place], [place] [address]    Special Requests  Final    Blood Culture results may not be optimal due to an excessive volume of blood received in culture bottles BOTTLES DRAWN AEROBIC AND ANAEROBIC Performed at [facility] [address] [place], [place] [address]    Culture   Final    NO GROWTH 2 DAYS Performed at [facility] [address] [place], [place] [address]    Report Status PENDING  Incomplete  Blood Culture (routine x 2)     Status: None (Preliminary result)   Collection Time: 05/25/22  9:01 PM   Specimen: BLOOD  Result Value Ref Range Status   Specimen Description   Final    BLOOD LEFT ANTECUBITAL Performed at [facility] [address] [place], [place] [address]    Special Requests   Final    Blood Culture results may not be optimal due to an excessive  volume of blood received in culture bottles BOTTLES DRAWN AEROBIC AND ANAEROBIC Performed at [facility], [place] [address] [place], [place] [address]    Culture   Final    NO GROWTH 2 DAYS Performed at [facility] [address], [place], [place] [address]    Report Status PENDING  Incomplete  MRSA Next Gen by PCR, Nasal     Status: Abnormal   Collection Time: 05/25/22 11:20 PM   Specimen: Nasal Mucosa; Nasal Swab  Result Value Ref Range Status   MRSA by PCR Next Gen DETECTED (A) NOT DETECTED Final    Comment: (NOTE) The GeneXpert MRSA Assay (FDA approved for NASAL specimens only), is one component of a comprehensive MRSA colonization surveillance program. It is not intended to diagnose MRSA infection nor to guide or monitor treatment for MRSA infections. Test performance is not FDA approved in patients less than 4 years old. Performed at [facility], [place] [address], [place], [place] [address]   Urine Culture     Status: None   Collection Time: 05/26/22  5:05 AM   Specimen: In/Out Cath Urine  Result Value Ref Range Status   Specimen Description   Final    IN/OUT CATH URINE Performed at [facility] [address], [place], [place] [address]    Special Requests   Final    NONE Performed at [facility], [place] [address] [place], [place] [address]    Culture   Final    NO GROWTH Performed at [facility], [place] [address], [place], [place] [address]    Report Status 05/27/2022 FINAL  Final         Radiology Studies: MR FOOT RIGHT W WO CONTRAST  Result Date: 05/26/2022 CLINICAL DATA:  Foot swelling, nondiabetic, osteomyelitis suspected. Transmetatarsal foot amputation between radiographs obtained 12/14/2021 and 01/25/2022. EXAM: MRI OF THE RIGHT FOREFOOT WITHOUT AND WITH CONTRAST TECHNIQUE: Multiplanar, multisequence MR imaging of the right forefoot was performed before and after  the administration of intravenous contrast. CONTRAST:  49mL GADAVIST GADOBUTROL 1 MMOL/ML IV SOLN COMPARISON:  Radiographs 05/25/2022 and 03/15/2022.  MRI 03/15/2022. FINDINGS: Bones/Joint/Cartilage Postsurgical changes from transmetatarsal amputations are unchanged from the previous MRI 10 weeks ago. No progressive cortical destruction identified. There is persistent marrow T2 hyperintensity throughout the remaining 1st through 5th metatarsals, sparing the bases, not significantly changed from previous MRI low level marrow enhancement is present in these areas following contrast. The cuneiform bones, cuboid and navicular appear unremarkable. Stable small osteochondral lesion of the talar head. No  significant joint effusions. Ligaments Intact Lisfranc ligament. Muscles and Tendons T2 hyperintensity throughout the forefoot musculature has mildly improved from the previous study. No focal intramuscular fluid collections or significant tenosynovitis. Postsurgical changes within the flexor and extensor tendons related to the previous transmetatarsal amputation. Soft tissues Previously demonstrated ill-defined fluid collections surrounding the distal 2nd and 3rd metatarsals have decreased in size compared with the previous MRI. There is associated soft tissue enhancement surrounding 2nd and 3rd metatarsals. No skin ulceration is apparent. There are complex fluid collections plantar to the distal aspects of the remaining 1st and 5th metatarsals. The 5th metatarsal collection measures up to 1.7 cm on coronal image 20/6 and does not show significant abnormal associated enhancement. There is a small collection plantar to the 1st metatarsal which does show peripheral enhancement. There is generally poor soft tissue enhancement within the lateral aspect of the remaining forefoot, suspicious for soft tissue devitalization. IMPRESSION: 1. Transmetatarsal amputation approximately 5 months ago. Compared with previous MRI of  03/15/2022, there is no significant change in marrow T2 hyperintensity and enhancement within the remaining 1st through 5th metatarsals. These findings are nonspecific but suspicious for chronic osteomyelitis. No progressive cortical destruction identified. 2. New complex fluid collections plantar to the distal aspects of the remaining 1st and 5th metatarsals. The medial component shows peripheral enhancement and could reflect a small abscess, especially if there is a skin wound in this area. The larger lateral component does not show any abnormal enhancement, although there is limited overall soft tissue enhancement laterally in the remaining forefoot, suspicious for underlying soft tissue devitalization. 3. Interval decrease in size of ill-defined fluid collections surrounding the distal 2nd and 3rd metatarsals with associated soft tissue enhancement. Electronically Signed   By: Richardean Sale M.D.   On: 05/26/2022 15:06   DG Foot Complete Right  Result Date: 05/25/2022 CLINICAL DATA:  Questionable sepsis - evaluate for abnormality EXAM: RIGHT FOOT COMPLETE - 3+ VIEW COMPARISON:  X-ray right foot 03/15/2022 FINDINGS: Status post amputation of all toes at the level of distal metatarsal. No cortical erosion or destruction. There is no evidence of fracture or dislocation. There is no evidence of arthropathy or other focal bone abnormality. Soft tissues are unremarkable. IMPRESSION: 1. No radiographic finding of osteomyelitis. If high clinical suspicion, please consider MRI (with intravenous contrast if GFR greater than 30) for further evaluation. 2. Status post amputation of all toes at the level of distal metatarsal. Electronically Signed   By: Iven Finn M.D.   On: 05/25/2022 21:00   DG Chest Port 1 View  Result Date: 05/25/2022 CLINICAL DATA:  Questionable sepsis.  Erythema in the leg. EXAM: PORTABLE CHEST 1 VIEW COMPARISON:  None Available. FINDINGS: The heart size and mediastinal contours are  within normal limits. Both lungs are clear. The visualized skeletal structures are unremarkable. IMPRESSION: No active disease. Electronically Signed   By: San Morelle M.D.   On: 05/25/2022 20:57        Scheduled Meds:  amphetamine-dextroamphetamine  20 mg Oral BID WC   budesonide (PULMICORT) nebulizer solution  0.25 mg Nebulization BID   enoxaparin (LOVENOX) injection  40 mg Subcutaneous QHS   gabapentin  600 mg Oral BID   mupirocin ointment  1 Application Nasal BID   nebivolol  10 mg Oral Daily   sodium chloride flush  3 mL Intravenous Q12H   valACYclovir  500 mg Oral Daily   Continuous Infusions:  cefTRIAXone (ROCEPHIN)  IV Stopped (05/27/22 0748)     LOS:  1 day    Time spent: 48 minutes spent on chart review, discussion with nursing staff, consultants, updating family and interview/physical exam; more than 50% of that time was spent in counseling and/or coordination of care.    Celestia Duva J [place] DO [facility] Available via Epic secure chat 7am-7pm After these hours, please refer to coverage provider listed on [email or web address] 05/27/2022, 10:13 AM

## 2022-05-27 NOTE — Progress Notes (Signed)
Orthopedic Tech Progress Note Patient Details:  George Shelton 1980/09/09 [id number]  Patient ID: George Shelton, male   DOB: [birth year], 41 y.o.   MRN: [id number]  George Shelton 05/27/2022, 6:09 PM Right post op shoe applied in pacu

## 2022-05-27 NOTE — Transfer of Care (Signed)
Immediate Anesthesia Transfer of Care Note  Patient: George Shelton  Procedure(s) Performed: INCISION AND DRAINAGE ABSCESS (Right: Foot)  Patient Location: PACU  Anesthesia Type:General  Level of Consciousness: oriented, sedated and patient cooperative  Airway & Oxygen Therapy: Patient Spontanous Breathing and Patient connected to face mask oxygen  Post-op Assessment: Report given to RN and Post -op Vital signs reviewed and stable  Post vital signs: Reviewed  Last Vitals:  Vitals Value Taken Time  BP 128/71 05/27/22 1800  Temp    Pulse 78 05/27/22 1800  Resp 10 05/27/22 1800  SpO2 100 % 05/27/22 1800  Vitals shown include unvalidated device data.  Last Pain:  Vitals:   05/27/22 1604  TempSrc:   PainSc: 5          Complications: No notable events documented.

## 2022-05-27 NOTE — Brief Op Note (Signed)
05/27/2022  5:51 PM  PATIENT:  George Shelton  41 y.o. male  PRE-OPERATIVE DIAGNOSIS:  Right foot abscess  POST-OPERATIVE DIAGNOSIS:  Right foot abscess  PROCEDURE:  Procedure(s): INCISION AND DRAINAGE ABSCESS (Right)  SURGEON:  Surgeon(s) and Role:    * Jynesis Nakamura, Nena Monna Crean, DPM - Primary  PHYSICIAN ASSISTANT: none  ASSISTANTS: none   ANESTHESIA:   general  EBL:  10 mL   BLOOD ADMINISTERED:none  DRAINS: none   LOCAL MEDICATIONS USED:  MARCAINE     SPECIMEN:  Source of Specimen:  bone 5th met for path and culture, bone 1st met for path and culture, deep tissue swab right lateral forefoot  DISPOSITION OF SPECIMEN:   Path and Micro  COUNTS:  YES  TOURNIQUET:   Total Tourniquet Time Documented: Ankle (Right) - 32 minutes Total: Ankle (Right) - 32 minutes   DICTATION: .Note written in [place]:  Return to floor for ongoing wound care, follow cultures, no further surgical plans .   Findings: No purulence encountered / no abscess medial lateral or central TMA amputation site. Bone appeared relatively healthy 1st and 5th met amp site.   PATIENT DISPOSITION:  PACU - hemodynamically stable.   Delay start of Pharmacological VTE agent (>24hrs) due to surgical blood loss or risk of bleeding: no

## 2022-05-27 NOTE — Anesthesia Postprocedure Evaluation (Signed)
Anesthesia Post Note  Patient: Riaz Onorato  Procedure(s) Performed: INCISION AND DRAINAGE ABSCESS (Right: Foot)     Patient location during evaluation: PACU Anesthesia Type: General Level of consciousness: awake and alert and oriented Pain management: pain level controlled Vital Signs Assessment: post-procedure vital signs reviewed and stable Respiratory status: spontaneous breathing, nonlabored ventilation and respiratory function stable Cardiovascular status: blood pressure returned to baseline and stable Postop Assessment: no apparent nausea or vomiting Anesthetic complications: no   No notable events documented.  Last Vitals:  Vitals:   05/27/22 1850 05/27/22 1900  BP: 129/87 (!) 131/91  Pulse: 71 80  Resp: 12 13  Temp: 36.8 C   SpO2: 100% 100%    Last Pain:  Vitals:   05/27/22 1850  TempSrc:   PainSc: 0-No pain                 Gustavo Meditz,Burnell A.

## 2022-05-27 NOTE — Anesthesia Preprocedure Evaluation (Addendum)
Anesthesia Evaluation  Patient identified by MRN, date of birth, ID band Patient awake    Reviewed: Allergy & Precautions, NPO status , Patient's Chart, lab work & pertinent test results, reviewed documented beta blocker date and time   Airway Mallampati: II  TM Distance: >3 FB Neck ROM: Full    Dental no notable dental hx. (+) Poor Dentition, Chipped, Dental Advisory Given   Pulmonary asthma , former smoker,    Pulmonary exam normal breath sounds clear to auscultation       Cardiovascular hypertension, Pt. on medications and Pt. on home beta blockers + angina Normal cardiovascular exam Rhythm:Regular Rate:Normal  Stress/MPS 07/2021 .  Findings are consistent with no ischemia and no prior myocardial infarction. The study is low risk. .  No ST deviation was noted. .  Left ventricular function is normal. Nuclear stress EF: 59 %. The left ventricular ejection fraction is normal (55-65%). End diastolic cavity size is normal. .  Prior study not available for comparison. .  Diaphragmatic attenuation noted.   Echo 05/2021 Normal LVSF     Neuro/Psych negative neurological ROS  negative psych ROS   GI/Hepatic negative GI ROS, (+)     substance abuse  , Hepatitis -, CHx/o polysubstance abuse   Endo/Other  negative endocrine ROS  Renal/GU negative Renal ROS  negative genitourinary   Musculoskeletal  (+) Arthritis , Right foot abscess S/P right TMA   Abdominal   Peds  Hematology MRSA sepsis   Anesthesia Other Findings   Reproductive/Obstetrics HSV                             Anesthesia Physical Anesthesia Plan  ASA: 3  Anesthesia Plan: General   Post-op Pain Management: Regional block* and Minimal or no pain anticipated   Induction: Intravenous  PONV Risk Score and Plan: 3 and Treatment may vary due to age or medical condition, Midazolam, Dexamethasone and Ondansetron  Airway  Management Planned: LMA and Oral ETT  Additional Equipment: None  Intra-op Plan:   Post-operative Plan: Extubation in OR  Informed Consent: I have reviewed the patients History and Physical, chart, labs and discussed the procedure including the risks, benefits and alternatives for the proposed anesthesia with the patient or authorized representative who has indicated his/her understanding and acceptance.     Dental advisory given  Plan Discussed with: CRNA and Anesthesiologist  Anesthesia Plan Comments:        Anesthesia Quick Evaluation

## 2022-05-28 ENCOUNTER — Encounter (HOSPITAL_COMMUNITY): Payer: Self-pay | Admitting: Podiatry

## 2022-05-28 DIAGNOSIS — I891 Lymphangitis: Secondary | ICD-10-CM | POA: Diagnosis not present

## 2022-05-28 MED ORDER — CEPHALEXIN 500 MG PO CAPS: 500.0000 mg | ORAL_CAPSULE | Freq: Four times a day (QID) | ORAL | 0 refills | Status: DC

## 2022-05-28 MED ORDER — LINEZOLID 600 MG PO TABS: 600.0000 mg | ORAL_TABLET | Freq: Two times a day (BID) | ORAL | 0 refills | Status: DC

## 2022-05-28 MED ORDER — PANTOPRAZOLE SODIUM 40 MG PO TBEC: 40.0000 mg | DELAYED_RELEASE_TABLET | Freq: Every day | ORAL | 2 refills | Status: DC

## 2022-05-28 MED ORDER — CELECOXIB 100 MG PO CAPS: 100.0000 mg | ORAL_CAPSULE | Freq: Two times a day (BID) | ORAL | 2 refills | Status: AC

## 2022-05-28 MED ORDER — GABAPENTIN 300 MG PO CAPS: 600.0000 mg | ORAL_CAPSULE | Freq: Three times a day (TID) | ORAL | 2 refills | Status: DC

## 2022-05-28 NOTE — Discharge Summary (Signed)
Physician Discharge Summary  George Shelton LNL:[id number] DOB: 1981-03-03 DOA: 05/25/2022  PCP: Pcp, No  Admit date: 05/25/2022 Discharge date: 05/28/2022  Admitted From: Home Disposition: Home  Recommendations for Outpatient Follow-up:  Follow up with PCP in 1-2 weeks Follow-up with podiatry as scheduled Continue antibiotics with Zyvox and Keflex to complete course for right lower extremity cellulitis  Home Health: No Equipment/Devices: Postoperative shoe  Discharge Condition: Stable Diet recommendation: Full code  History of present illness:  George Shelton is a 41 y.o. male with past medical history significant for right foot osteomyelitis s/p TMA, HSV, history of MRSA, recurrent cellulitis, substance abuse, who presented to [facility] ED on 10/24 with pain to his right foot associated with redness and swelling.  Reports onset roughly 2 days prior to admission with notable streaking up his right leg.  Also reports drainage from his right foot.  Denies fever, no chills, no chest pain, no abdominal pain, no nausea/vomiting/diarrhea.   Recently admitted by the surgical service for right groin infection s/p I&D and treated with a 7-day course of Zyvox outpatient.  Patient reports completion of antibiotics without any missed doses.   In the ED, temperature 98.5 F, HR 109, RR 24, BP 155/91, SPO2 97% on room air.  WBC 9.2, hemoglobin 11.7, platelet count 225.  Sodium 136, potassium 3.5, chloride 105, CO2 24, glucose 105, BUN 22, creatinine 1.43.  AST 35, ALT 37, total bilirubin 0.6.  Lactic acid 1.2.  Urinalysis unrevealing.  MRSA PCR detected.  Chest x-ray with no active cardiopulmonary disease process.  Right foot with no radiographic findings of osteomyelitis.  Patient received IV fluids in the ED and started on antibiotics.  Hospital service consulted for admission for further evaluation and treatment of right foot cellulitis and concern for abscess.  Hospital course:  Right lower  extremity cellulitis, abscess ruled out Patient presenting to ED with 2-day history prior to admission of swelling, erythema, pain to right foot with associated drainage surrounding previous TMA site.  Patient is afebrile without leukocytosis, but CRP elevated 4.4.  MR right foot with and without contrast with findings of new complex fluid collections plantar to distal aspects first and fifth metatarsals and with findings suspicious for chronic osteomyelitis.  Podiatry was consulted and followed during hospital course.  Patient was started on ceftriaxone 1 g IV every 24 hours.  Patient underwent incision and drainage, TMA stump site medial lateral central right foot with bone biopsy for pathology and culture; no significant purulence was discovered.  Patient will continue Zyvox and Keflex on discharge.  Outpatient follow-up with podiatry.   Hx HSV Continue home Valtrex 500 mg p.o. daily   Asthma Not in acute exacerbation.   Neuropathic pain Gabapentin 600 mg p.o. 3 times daily  Discharge Diagnoses:  Principal Problem:   Lymphangitis Active Problems:   HSV-1 (herpes simplex virus 1) infection   Asthma, chronic   Cough with hemoptysis   Cellulitis   Neuropathic pain    Discharge Instructions  Discharge Instructions     Call MD for:  difficulty breathing, headache or visual disturbances   Complete by: As directed    Call MD for:  persistant dizziness or light-headedness   Complete by: As directed    Call MD for:  persistant nausea and vomiting   Complete by: As directed    Call MD for:  redness, tenderness, or signs of infection (pain, swelling, redness, odor or green/yellow discharge around incision site)   Complete by: As directed  Call MD for:  temperature >100.4   Complete by: As directed    Diet - low sodium heart healthy   Complete by: As directed    Discharge instructions   Complete by: As directed    Okay to bear weight in postoperative shoe   Increase activity  slowly   Complete by: As directed    Leave dressing on - Keep it clean, dry, and intact until clinic visit   Complete by: As directed       Allergies as of 05/28/2022       Reactions   Codeine Other (See Comments)   PATIENT DOES NOT WANT THIS   Other Other (See Comments)   History of drug addiction, per the patient- does NOT want pain medication of any kind. Sober/clean for 8 1/2 years. Patient states "NO OPIATES"!!!   Vancomycin Other (See Comments)   "It kills my white blood cells"- PATIENT SAID HE **WILL NOT, UNDER ANY CIRCUMSTANCES** TAKE THIS        Medication List     STOP taking these medications    gabapentin 600 MG tablet Commonly known as: Neurontin Replaced by: gabapentin 300 MG capsule   ibuprofen 200 MG tablet Commonly known as: ADVIL   ondansetron 4 MG tablet Commonly known as: Zofran   traMADol 50 MG tablet Commonly known as: ULTRAM       TAKE these medications    albuterol 108 (90 Base) MCG/ACT inhaler Commonly known as: VENTOLIN HFA Inhale 2 puffs into the lungs every 4 (four) hours as needed for wheezing or shortness of breath.   amphetamine-dextroamphetamine 20 MG tablet Commonly known as: ADDERALL Take 20 mg by mouth 2 (two) times daily.   celecoxib 100 MG capsule Commonly known as: CeleBREX Take 1 capsule (100 mg total) by mouth 2 (two) times daily.   cephALEXin 500 MG capsule Commonly known as: KEFLEX Take 1 capsule (500 mg total) by mouth 4 (four) times daily for 10 days.   Flovent HFA 110 MCG/ACT inhaler Generic drug: fluticasone Inhale 2 puffs into the lungs 2 (two) times daily.   gabapentin 300 MG capsule Commonly known as: NEURONTIN Take 2 capsules (600 mg total) by mouth 3 (three) times daily. Replaces: gabapentin 600 MG tablet   linezolid 600 MG tablet Commonly known as: Zyvox Take 1 tablet (600 mg total) by mouth 2 (two) times daily for 10 days.   nebivolol 10 MG tablet Commonly known as: Bystolic Take 1 tablet  (10 mg total) by mouth daily.   valACYclovir 500 MG tablet Commonly known as: VALTREX Take 500 mg by mouth daily.               Discharge Care Instructions  (From admission, onward)           Start     Ordered   05/28/22 0000  Leave dressing on - Keep it clean, dry, and intact until clinic visit        05/28/22 0919            Allergies  Allergen Reactions   Codeine Other (See Comments)    PATIENT DOES NOT WANT THIS   Other Other (See Comments)    History of drug addiction, per the patient- does NOT want pain medication of any kind. Sober/clean for 8 1/2 years.  Patient states "NO OPIATES"!!!   Vancomycin Other (See Comments)    "It kills my white blood cells"- PATIENT SAID HE **WILL NOT, UNDER ANY CIRCUMSTANCES** TAKE THIS  Consultations: Podiatry, Dr. Ardelle Anton, Dr. Annamary Rummage   Procedures/Studies: DG Foot Complete Right  Result Date: 05/27/2022 CLINICAL DATA:  Postop, incision and drainage of abscess and right foot. EXAM: RIGHT FOOT COMPLETE - 3+ VIEW COMPARISON:  05/25/2022. FINDINGS: Status post transmetatarsal amputation of the first through fifth digits. No fracture or dislocation. Air is identified in the soft tissues, compatible with postop history. IMPRESSION: Status post incision and drainage of right foot abscess and transmetatarsal amputation. Electronically Signed   By: Thornell Sartorius M.D.   On: 05/27/2022 20:06   MR FOOT RIGHT W WO CONTRAST  Result Date: 05/26/2022 CLINICAL DATA:  Foot swelling, nondiabetic, osteomyelitis suspected. Transmetatarsal foot amputation between radiographs obtained 12/14/2021 and 01/25/2022. EXAM: MRI OF THE RIGHT FOREFOOT WITHOUT AND WITH CONTRAST TECHNIQUE: Multiplanar, multisequence MR imaging of the right forefoot was performed before and after the administration of intravenous contrast. CONTRAST:  24mL GADAVIST GADOBUTROL 1 MMOL/ML IV SOLN COMPARISON:  Radiographs 05/25/2022 and 03/15/2022.  MRI 03/15/2022.  FINDINGS: Bones/Joint/Cartilage Postsurgical changes from transmetatarsal amputations are unchanged from the previous MRI 10 weeks ago. No progressive cortical destruction identified. There is persistent marrow T2 hyperintensity throughout the remaining 1st through 5th metatarsals, sparing the bases, not significantly changed from previous MRI low level marrow enhancement is present in these areas following contrast. The cuneiform bones, cuboid and navicular appear unremarkable. Stable small osteochondral lesion of the talar head. No significant joint effusions. Ligaments Intact Lisfranc ligament. Muscles and Tendons T2 hyperintensity throughout the forefoot musculature has mildly improved from the previous study. No focal intramuscular fluid collections or significant tenosynovitis. Postsurgical changes within the flexor and extensor tendons related to the previous transmetatarsal amputation. Soft tissues Previously demonstrated ill-defined fluid collections surrounding the distal 2nd and 3rd metatarsals have decreased in size compared with the previous MRI. There is associated soft tissue enhancement surrounding 2nd and 3rd metatarsals. No skin ulceration is apparent. There are complex fluid collections plantar to the distal aspects of the remaining 1st and 5th metatarsals. The 5th metatarsal collection measures up to 1.7 cm on coronal image 20/6 and does not show significant abnormal associated enhancement. There is a small collection plantar to the 1st metatarsal which does show peripheral enhancement. There is generally poor soft tissue enhancement within the lateral aspect of the remaining forefoot, suspicious for soft tissue devitalization. IMPRESSION: 1. Transmetatarsal amputation approximately 5 months ago. Compared with previous MRI of 03/15/2022, there is no significant change in marrow T2 hyperintensity and enhancement within the remaining 1st through 5th metatarsals. These findings are nonspecific but  suspicious for chronic osteomyelitis. No progressive cortical destruction identified. 2. New complex fluid collections plantar to the distal aspects of the remaining 1st and 5th metatarsals. The medial component shows peripheral enhancement and could reflect a small abscess, especially if there is a skin wound in this area. The larger lateral component does not show any abnormal enhancement, although there is limited overall soft tissue enhancement laterally in the remaining forefoot, suspicious for underlying soft tissue devitalization. 3. Interval decrease in size of ill-defined fluid collections surrounding the distal 2nd and 3rd metatarsals with associated soft tissue enhancement. Electronically Signed   By: Carey Bullocks M.D.   On: 05/26/2022 15:06   DG Foot Complete Right  Result Date: 05/25/2022 CLINICAL DATA:  Questionable sepsis - evaluate for abnormality EXAM: RIGHT FOOT COMPLETE - 3+ VIEW COMPARISON:  X-ray right foot 03/15/2022 FINDINGS: Status post amputation of all toes at the level of distal metatarsal. No cortical erosion or destruction. There is no  evidence of fracture or dislocation. There is no evidence of arthropathy or other focal bone abnormality. Soft tissues are unremarkable. IMPRESSION: 1. No radiographic finding of osteomyelitis. If high clinical suspicion, please consider MRI (with intravenous contrast if GFR greater than 30) for further evaluation. 2. Status post amputation of all toes at the level of distal metatarsal. Electronically Signed   By: Tish Frederickson M.D.   On: 05/25/2022 21:00   DG Chest Port 1 View  Result Date: 05/25/2022 CLINICAL DATA:  Questionable sepsis.  Erythema in the leg. EXAM: PORTABLE CHEST 1 VIEW COMPARISON:  None Available. FINDINGS: The heart size and mediastinal contours are within normal limits. Both lungs are clear. The visualized skeletal structures are unremarkable. IMPRESSION: No active disease. Electronically Signed   By: Marin Roberts M.D.   On: 05/25/2022 20:57     Subjective: Patient seen examined bedside, resting comfortably.  Spouse present.  States needs to leave the hospital right now due to death in family.  Incision and drainage yesterday of right TMA site unrevealing for abscess and biopsy taken.  No other specific questions or concerns at this time.  Denies headache, no dizziness, no chest pain, no palpitations, no shortness of breath, no abdominal pain, no focal weakness, no fever/chills/night sweats, no nausea/vomiting/diarrhea, no fatigue, no paresthesias.  No acute events overnight per nursing staff.  Discharge Exam: Vitals:   05/28/22 0452 05/28/22 0732  BP: (!) 162/98   Pulse: 83   Resp: 18   Temp: (!) 97.5 F (36.4 C)   SpO2: 99% 96%   Vitals:   05/27/22 2309 05/28/22 0122 05/28/22 0452 05/28/22 0732  BP:  (!) 139/94 (!) 162/98   Pulse:  86 83   Resp:  18 18   Temp:  97.9 F (36.6 C) (!) 97.5 F (36.4 C)   TempSrc:  Oral Oral   SpO2: 98% 100% 99% 96%  Weight:      Height:        Physical Exam: GEN: NAD, alert and oriented x 3, wd/wn HEENT: NCAT, PERRL, EOMI, sclera clear, MMM PULM: CTAB w/o wheezes/crackles, normal respiratory effort, on room air CV: RRR w/o M/G/R GI: abd soft, NTND, NABS, no R/G/M MSK: no peripheral edema, moves all extremities independently, right foot TMA site with dressing/Ace wrap in place, clean/dry/intact NEURO: CN II-XII intact, no focal deficits, sensation to light touch intact PSYCH: normal mood/affect Integumentary: Right foot TMA site as above, dry/intact, no rashes or wounds    The results of significant diagnostics from this hospitalization (including imaging, microbiology, ancillary and laboratory) are listed below for reference.     Microbiology: Recent Results (from the past 240 hour(s))  Blood Culture (routine x 2)     Status: None (Preliminary result)   Collection Time: 05/25/22  8:43 PM   Specimen: BLOOD  Result Value Ref Range Status    Specimen Description   Final    BLOOD BLOOD LEFT FOREARM Performed at [facility], [address], [place], [place] [address]    Special Requests   Final    Blood Culture results may not be optimal due to an excessive volume of blood received in culture bottles BOTTLES DRAWN AEROBIC AND ANAEROBIC Performed at [facility], [address], [place], [place] [address]    Culture   Final    NO GROWTH 3 DAYS Performed at [facility], [address], [place], [place] [address]    Report Status PENDING  Incomplete  Blood Culture (routine x 2)     Status: None (Preliminary result)   Collection Time: 05/25/22  9:01 PM   Specimen: BLOOD  Result Value Ref Range Status   Specimen Description   Final    BLOOD LEFT ANTECUBITAL Performed at [place] [address] [place], [place] [address]    Special Requests   Final    Blood Culture results may not be optimal due to an excessive volume of blood received in culture bottles BOTTLES DRAWN AEROBIC AND ANAEROBIC Performed at [facility], [place] [address], [place], [place] [address]    Culture   Final    NO GROWTH 3 DAYS Performed at [facility], [place] [address], [place], [place] [address]    Report Status PENDING  Incomplete  MRSA Next Gen by PCR, Nasal     Status: Abnormal   Collection Time: 05/25/22 11:20 PM   Specimen: Nasal Mucosa; Nasal Swab  Result Value Ref Range Status   MRSA by PCR Next Gen DETECTED (A) NOT DETECTED Final    Comment: (NOTE) The GeneXpert MRSA Assay (FDA approved for NASAL specimens only), is one component of a comprehensive MRSA colonization surveillance program. It is not intended to diagnose MRSA infection nor to guide or monitor treatment for MRSA infections. Test performance is not FDA approved in patients less than 36 years old. Performed at [facility], [place] [address], [place], [place]  [address]   Urine Culture     Status: None   Collection Time: 05/26/22  5:05 AM   Specimen: In/Out Cath Urine  Result Value Ref Range Status   Specimen Description   Final    IN/OUT CATH URINE Performed at [facility] [address] [place], [place] [address]    Special Requests   Final    NONE Performed at [facility], [place] [address] [place], [place] [address]    Culture   Final    NO GROWTH Performed at [facility], [place] [address], [place], [place] [address]    Report Status 05/27/2022 FINAL  Final  Aerobic/Anaerobic Culture w Gram Stain (surgical/deep wound)     Status: None (Preliminary result)   Collection Time: 05/27/22  5:14 PM   Specimen: Foot, Right; Wound  Result Value Ref Range Status   Specimen Description   Final    WOUND RIGHT FOOT Performed at [facility] [address] [place], [place] [address]    Special Requests   Final    NONE Performed at [facility], [place] [address] [place], [place] [address]    Gram Stain   Final    RARE WBC PRESENT, PREDOMINANTLY MONONUCLEAR RARE GRAM POSITIVE COCCI IN SINGLES Performed at [facility], [place] [address] [place], [place] [address]    Culture PENDING  Incomplete   Report Status PENDING  Incomplete  Aerobic/Anaerobic Culture w Gram Stain (surgical/deep wound)     Status: None (Preliminary result)   Collection Time: 05/27/22  5:22 PM   Specimen: Bone; Tissue  Result Value Ref Range Status   Specimen Description   Final    BONE 5TH METATARSAL Performed at [facility], [place] [address] [place], [place] [address]    Special Requests   Final    NONE Performed at [facility], [place] [address] [place], [place] [address]    Gram Stain   Final  FEW WBC PRESENT, PREDOMINANTLY MONONUCLEAR NO ORGANISMS SEEN Performed at [facility], [address] [place], [place] [address]     Culture PENDING  Incomplete   Report Status PENDING  Incomplete  Aerobic/Anaerobic Culture w Gram Stain (surgical/deep wound)     Status: None (Preliminary result)   Collection Time: 05/27/22  5:26 PM   Specimen: Bone; Tissue  Result Value Ref Range Status   Specimen Description   Final    BONE 1ST METATARSAL Performed at [facility], [address], [place], [place] [address]    Special Requests   Final    NONE Performed at [facility], [address] [place], [place] [address]    Gram Stain   Final    FEW WBC PRESENT, PREDOMINANTLY MONONUCLEAR NO ORGANISMS SEEN Performed at [facility], [address], [place], [place] [address]    Culture PENDING  Incomplete   Report Status PENDING  Incomplete     Labs: BNP (last 3 results) Recent Labs    05/25/22 2320  BNP 27.4   Basic Metabolic Panel: Recent Labs  Lab 05/25/22 2043 05/26/22 0500 05/27/22 0310  NA 136 138 140  K 3.5 3.6 4.2  CL 105 108 109  CO2 24 24 25   GLUCOSE 105* 93 94  BUN 22* 14 11  CREATININE 1.43* 1.00 0.98  CALCIUM 8.6* 8.2* 8.6*   Liver Function Tests: Recent Labs  Lab 05/25/22 2043 05/26/22 0500  AST 35 34  ALT 37 38  ALKPHOS 66 55  BILITOT 0.6 0.5  PROT 6.2* 5.9*  ALBUMIN 3.4* 3.1*   No results for input(s): "LIPASE", "AMYLASE" in the last 168 hours. No results for input(s): "AMMONIA" in the last 168 hours. CBC: Recent Labs  Lab 05/25/22 2043 05/26/22 0500 05/27/22 0310  WBC 9.2 6.3 5.4  NEUTROABS 7.8*  --   --   HGB 11.7* 12.1* 12.9*  HCT 35.9* 37.7* 39.3  MCV 88.4 88.3 87.1  PLT 225 218 246   Cardiac Enzymes: No results for input(s): "CKTOTAL", "CKMB", "CKMBINDEX", "TROPONINI" in the last 168 hours. BNP: Invalid input(s): "POCBNP" CBG: No results for input(s): "GLUCAP" in the last 168 hours. D-Dimer No results for input(s): "DDIMER" in the last 72 hours. Hgb A1c No results for input(s): "HGBA1C" in the last 72  hours. Lipid Profile No results for input(s): "CHOL", "HDL", "LDLCALC", "TRIG", "CHOLHDL", "LDLDIRECT" in the last 72 hours. Thyroid function studies No results for input(s): "TSH", "T4TOTAL", "T3FREE", "THYROIDAB" in the last 72 hours.  Invalid input(s): "FREET3" Anemia work up No results for input(s): "VITAMINB12", "FOLATE", "FERRITIN", "TIBC", "IRON", "RETICCTPCT" in the last 72 hours. Urinalysis    Component Value Date/Time   COLORURINE STRAW (A) 05/26/2022 0504   APPEARANCEUR CLEAR 05/26/2022 0504   LABSPEC 1.004 (L) 05/26/2022 0504   PHURINE 7.0 05/26/2022 0504   GLUCOSEU NEGATIVE 05/26/2022 0504   HGBUR NEGATIVE 05/26/2022 0504   BILIRUBINUR NEGATIVE 05/26/2022 0504   KETONESUR NEGATIVE 05/26/2022 0504   PROTEINUR NEGATIVE 05/26/2022 0504   NITRITE NEGATIVE 05/26/2022 0504   LEUKOCYTESUR NEGATIVE 05/26/2022 0504   Sepsis Labs Recent Labs  Lab 05/25/22 2043 05/26/22 0500 05/27/22 0310  WBC 9.2 6.3 5.4   Microbiology Recent Results (from the past 240 hour(s))  Blood Culture (routine x 2)     Status: None (Preliminary result)   Collection Time: 05/25/22  8:43 PM   Specimen: BLOOD  Result Value Ref Range Status   Specimen Description  Final    BLOOD BLOOD LEFT FOREARM Performed at [facility], [address] [place], [place] [address]    Special Requests   Final    Blood Culture results may not be optimal due to an excessive volume of blood received in culture bottles BOTTLES DRAWN AEROBIC AND ANAEROBIC Performed at [facility], [address] [place], [place] [address]    Culture   Final    NO GROWTH 3 DAYS Performed at [facility], [address] [place], [place] [address]    Report Status PENDING  Incomplete  Blood Culture (routine x 2)     Status: None (Preliminary result)   Collection Time: 05/25/22  9:01 PM   Specimen: BLOOD  Result Value Ref Range Status   Specimen Description   Final    BLOOD LEFT  ANTECUBITAL Performed at [facility], [address] [place], [place] [address]    Special Requests   Final    Blood Culture results may not be optimal due to an excessive volume of blood received in culture bottles BOTTLES DRAWN AEROBIC AND ANAEROBIC Performed at [facility], [address] [place], [place] [address]    Culture   Final    NO GROWTH 3 DAYS Performed at [facility], [address] [place], [place] [address]    Report Status PENDING  Incomplete  MRSA Next Gen by PCR, Nasal     Status: Abnormal   Collection Time: 05/25/22 11:20 PM   Specimen: Nasal Mucosa; Nasal Swab  Result Value Ref Range Status   MRSA by PCR Next Gen DETECTED (A) NOT DETECTED Final    Comment: (NOTE) The GeneXpert MRSA Assay (FDA approved for NASAL specimens only), is one component of a comprehensive MRSA colonization surveillance program. It is not intended to diagnose MRSA infection nor to guide or monitor treatment for MRSA infections. Test performance is not FDA approved in patients less than 371 years old. Performed at [facility], [address] [place], [place] [address]   Urine Culture     Status: None   Collection Time: 05/26/22  5:05 AM   Specimen: In/Out Cath Urine  Result Value Ref Range Status   Specimen Description   Final    IN/OUT CATH URINE Performed at [facility], [address] [place], [place] [address]    Special Requests   Final    NONE Performed at [facility], [address] [place], [place] [address]    Culture   Final    NO GROWTH Performed at [facility], [address] [place], [place] [address]    Report Status 05/27/2022 FINAL  Final  Aerobic/Anaerobic Culture w Gram Stain (surgical/deep wound)     Status: None (Preliminary result)   Collection Time: 05/27/22  5:14 PM   Specimen: Foot, Right; Wound  Result Value Ref Range Status    Specimen Description   Final    WOUND RIGHT FOOT Performed at [facility], [address] [place], [place] [address]    Special Requests   Final    NONE Performed at [facility] [facility], [address] [place], [place] [address]    Gram Stain   Final    RARE WBC PRESENT, PREDOMINANTLY MONONUCLEAR RARE GRAM POSITIVE COCCI IN SINGLES Performed at [facility], [address] [place], [place] [address]    Culture PENDING  Incomplete   Report Status PENDING  Incomplete  Aerobic/Anaerobic Culture w Gram Stain (surgical/deep wound)     Status: None (Preliminary result)   Collection Time: 05/27/22  5:22 PM   Specimen: Bone; Tissue  Result Value Ref Range Status   Specimen Description   Final    BONE 5TH METATARSAL Performed at [facility], [address], [place], [place] [address]    Special Requests   Final    NONE Performed at [facility], [address], [place], [place] [address]    Gram Stain   Final    FEW WBC PRESENT, PREDOMINANTLY MONONUCLEAR NO ORGANISMS SEEN Performed at [facility], [address] [place], [place] [address]    Culture PENDING  Incomplete   Report Status PENDING  Incomplete  Aerobic/Anaerobic Culture w Gram Stain (surgical/deep wound)     Status: None (Preliminary result)   Collection Time: 05/27/22  5:26 PM   Specimen: Bone; Tissue  Result Value Ref Range Status   Specimen Description   Final    BONE 1ST METATARSAL Performed at [facility], [address] [place], [place] [address]    Special Requests   Final    NONE Performed at [facility], [address] [place], [place] [address]    Gram Stain   Final    FEW WBC PRESENT, PREDOMINANTLY MONONUCLEAR NO ORGANISMS SEEN Performed at [facility], [address] [place], [place] [address]    Culture PENDING  Incomplete   Report Status PENDING  Incomplete      Time coordinating discharge: Over 30 minutes  SIGNED:   Alvira Philips [place], DO  [facility] 05/28/2022, 9:19 AM

## 2022-05-28 NOTE — Plan of Care (Signed)
Patient discharged home via private vehicle with significant other.  Ivan Anchors, RN 05/28/22 10:20 AM

## 2022-05-30 LAB — CULTURE, BLOOD (ROUTINE X 2)
Culture: NO GROWTH
Culture: NO GROWTH

## 2022-06-01 LAB — SURGICAL PATHOLOGY

## 2022-06-02 LAB — AEROBIC/ANAEROBIC CULTURE W GRAM STAIN (SURGICAL/DEEP WOUND)

## 2022-06-03 ENCOUNTER — Emergency Department (HOSPITAL_COMMUNITY): Payer: BC Managed Care – PPO

## 2022-06-03 ENCOUNTER — Other Ambulatory Visit: Payer: Self-pay

## 2022-06-03 ENCOUNTER — Ambulatory Visit (INDEPENDENT_AMBULATORY_CARE_PROVIDER_SITE_OTHER): Payer: BC Managed Care – PPO | Admitting: Podiatry

## 2022-06-03 ENCOUNTER — Emergency Department (HOSPITAL_COMMUNITY)
Admission: EM | Admit: 2022-06-03 | Discharge: 2022-06-03 | Disposition: A | Discharge status: Home / Self Care | Payer: BC Managed Care – PPO | Attending: Emergency Medicine | Admitting: Emergency Medicine

## 2022-06-03 ENCOUNTER — Encounter (HOSPITAL_COMMUNITY): Payer: Self-pay

## 2022-06-03 DIAGNOSIS — R911 Solitary pulmonary nodule: Secondary | ICD-10-CM | POA: Insufficient documentation

## 2022-06-03 DIAGNOSIS — R0789 Other chest pain: Secondary | ICD-10-CM | POA: Diagnosis not present

## 2022-06-03 DIAGNOSIS — R079 Chest pain, unspecified: Secondary | ICD-10-CM

## 2022-06-03 DIAGNOSIS — M86671 Other chronic osteomyelitis, right ankle and foot: Secondary | ICD-10-CM

## 2022-06-03 DIAGNOSIS — L03115 Cellulitis of right lower limb: Secondary | ICD-10-CM

## 2022-06-03 DIAGNOSIS — Z9889 Other specified postprocedural states: Secondary | ICD-10-CM

## 2022-06-03 DIAGNOSIS — I2699 Other pulmonary embolism without acute cor pulmonale: Secondary | ICD-10-CM | POA: Diagnosis not present

## 2022-06-03 DIAGNOSIS — L02611 Cutaneous abscess of right foot: Secondary | ICD-10-CM

## 2022-06-03 DIAGNOSIS — R638 Other symptoms and signs concerning food and fluid intake: Secondary | ICD-10-CM | POA: Diagnosis not present

## 2022-06-03 LAB — BASIC METABOLIC PANEL
Anion gap: 9 (ref 5–15)
BUN: 21 mg/dL — ABNORMAL HIGH (ref 6–20)
CO2: 26 mmol/L (ref 22–32)
Calcium: 9.1 mg/dL (ref 8.9–10.3)
Chloride: 104 mmol/L (ref 98–111)
Creatinine, Ser: 1.14 mg/dL (ref 0.61–1.24)
GFR, Estimated: >60 mL/min (ref >60)
Glucose, Bld: 88 mg/dL (ref 70–99)
Potassium: 4 mmol/L (ref 3.5–5.1)
Sodium: 139 mmol/L (ref 135–145)

## 2022-06-03 LAB — TROPONIN I (HIGH SENSITIVITY): Troponin I (High Sensitivity): 12 ng/L (ref <18)

## 2022-06-03 LAB — CBC
HCT: 39 % (ref 39.0–52.0)
Hemoglobin: 12.5 g/dL — ABNORMAL LOW (ref 13.0–17.0)
MCH: 27.8 pg (ref 26.0–34.0)
MCHC: 32.1 g/dL (ref 30.0–36.0)
MCV: 86.9 fL (ref 80.0–100.0)
Platelets: 338 10*3/uL (ref 150–400)
RBC: 4.49 MIL/uL (ref 4.22–5.81)
RDW: 13.2 % (ref 11.5–15.5)
WBC: 5.5 10*3/uL (ref 4.0–10.5)
nRBC: 0 % (ref 0.0–0.2)

## 2022-06-03 MED ORDER — IOHEXOL 350 MG/ML SOLN
100.0000 mL | Freq: Once | INTRAVENOUS | Status: AC | PRN
  Administered 2022-06-03: 100 mL via INTRAVENOUS

## 2022-06-03 NOTE — Progress Notes (Signed)
Pt taken to CT. IV started and scan completed. Pt placed back in the waiting room with IV still in place. Communicated with the pt to not leave with the IV still in place.  

## 2022-06-03 NOTE — ED Provider Triage Note (Signed)
Emergency Medicine Provider Triage Evaluation Note  George Shelton , a 41 y.o. male  was evaluated in triage.  Pt complains of chest pain and shortness of breath which began approximately 2 hours ago.  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  He endorses sharp/steady chest pain which is severe in nature and is central in location.  He denies any radiation of symptoms this time.  He does endorse mild to moderate shortness of breath which began at the same time.  The patient was mildly tachycardic upon arrival.  He states that his Apple Watch registered atrial fibrillation before coming to the hospital.  He denies abdominal pain, nausea, vomiting this time.  The patient does have a possible infection of the right foot which is being followed by podiatry and has been referred to infectious disease for the same.  He has had previous partial amputation  Review of Systems  Positive: As above Negative: As above  Physical Exam  BP (!) 159/92   Pulse 99   Temp 98.2 F (36.8 C) (Oral)   Resp 18   SpO2 98%  Gen:   Awake, no distress   Resp:  Normal effort  MSK:   Moves extremities without difficulty  Other:  Patient appears anxious  Medical Decision Making  Medically screening exam initiated at 2:37 PM.  Appropriate orders placed.  George Shelton was informed that the remainder of the evaluation will be completed by another provider, this initial triage assessment does not replace that evaluation, and the importance of remaining in the ED until their evaluation is complete.     Dorothyann Peng, PA-C 06/03/22 1439

## 2022-06-03 NOTE — ED Triage Notes (Signed)
Pt c/o chest pain starting this morning, pt states he has a hx of chest pain but it feels worse this time. Pt denies SOB. Pt states infectious disease was calling him to tell him he needed IV antibiotics for his bone infection in right foot.

## 2022-06-03 NOTE — ED Provider Notes (Signed)
[facility] DEPT Provider Note   CSN: [id number] Arrival date & time: 06/03/22  1420     History Chief Complaint  Patient presents with   Chest Pain    HPI George Shelton is a 41 y.o. male presenting for episode of chest pain.  He has a significant medical history including history of septic arthritis, active ongoing right lower extremity treatment for osteomyelitis starting IV antibiotics on Monday in the outpatient setting.  He states that today he got the news he needed to check back in for further antibiotics at his outpatient facility and he states he started having immediate chest pain and pressure.  He endorses a history of similar especially under psychosocial stressor.  He was seen last year for similar multiple times ultimately underwent a nuclear medicine scan which had no focal pathology of December of last year.  He denies any symptoms at this time after 4 and half hour wait in the emergency department his symptoms have resolved.  He denies fevers or chills, nausea vomiting, syncope shortness of breath.  Patient endorses poor p.o. intake.  He has not been taking his prescribed beta-blocker or pantoprazole due to frustrations with the medical system and tired of taking multiple medications..   Patient's recorded medical, surgical, social, medication list and allergies were reviewed in the Snapshot window as part of the initial history.   Review of Systems   Review of Systems  Constitutional:  Negative for chills and fever.  HENT:  Negative for ear pain and sore throat.   Eyes:  Negative for pain and visual disturbance.  Respiratory:  Negative for cough and shortness of breath.   Cardiovascular:  Positive for chest pain. Negative for palpitations.  Gastrointestinal:  Negative for abdominal pain and vomiting.  Genitourinary:  Negative for dysuria and hematuria.  Musculoskeletal:  Negative for arthralgias and back pain.  Skin:  Negative for  color change and rash.  Neurological:  Negative for seizures and syncope.  All other systems reviewed and are negative.   Physical Exam Updated Vital Signs BP (!) 145/83   Pulse 100   Temp 98.2 F (36.8 C)   Resp 16   Ht 6\' 1"  (1.854 m)   Wt 88.4 kg   SpO2 97%   BMI 25.71 kg/m  Physical Exam Vitals and nursing note reviewed.  Constitutional:      General: He is not in acute distress.    Appearance: He is well-developed.  HENT:     Head: Normocephalic and atraumatic.  Eyes:     Conjunctiva/sclera: Conjunctivae normal.  Cardiovascular:     Rate and Rhythm: Normal rate and regular rhythm.     Heart sounds: No murmur heard. Pulmonary:     Effort: Pulmonary effort is normal. No respiratory distress.     Breath sounds: Normal breath sounds.  Abdominal:     Palpations: Abdomen is soft.     Tenderness: There is no abdominal tenderness.  Musculoskeletal:        General: No swelling.     Cervical back: Neck supple.  Skin:    General: Skin is warm and dry.     Capillary Refill: Capillary refill takes less than 2 seconds.  Neurological:     Mental Status: He is alert.  Psychiatric:        Mood and Affect: Mood normal.      ED Course/ Medical Decision Making/ A&P    Procedures Procedures   Medications Ordered in ED Medications  iohexol (  OMNIPAQUE) 350 MG/ML injection 100 mL (100 mLs Intravenous Contrast Given 06/03/22 1508)   Medical Decision Making: George Shelton is a 41 y.o. male who presented to the ED today with chest pain, detailed above..    Patient's presentation is complicated by their history of multiple comorbid medical problems.  Patient placed on continuous vitals and telemetry monitoring while in ED which was reviewed periodically.  Complete initial physical exam performed, notably the patient was HDS in NAD.   Reviewed and confirmed nursing documentation for past medical history, family history, social history.    Initial Assessment: With the  patient's presentation of left-sided chest pain, most likely diagnosis is musculoskeletal chest pain versus GERD, although ACS remains on the differential. Other diagnoses were considered including (but not limited to) pulmonary embolism, community-acquired pneumonia, aortic dissection, pneumothorax, underlying bony abnormality, anemia. These are considered less likely due to history of present illness and physical exam findings.    Aortic Dissection also reconsidered but seems less likely based on the location, quality, onset, and severity of symptoms in this case. Patient also has a lack of underlying history of AD or TAA.  This is most consistent with an acute life/limb threatening illness complicated by underlying chronic conditions.   Initial Plan: Evaluate for ACS with single troponin and EKG evaluated as below.  Single troponin sufficient due to duration of symptoms approximately 6 hours prior to arrival, intermittent nature of symptoms over the past few days, resolution of symptoms. Evaluate for dissection, bony abnormality, or pneumonia with chest x-ray and screening laboratory evaluation including CBC, BMP Further evaluation for PE is reasonable given continuation of symptoms, plan for outpatient PE study already scheduled by PCP. Further evaluation for Thoracic Aortic Dissection not indicated at this time based on patient's clinical history and PE findings.   Initial Study Results: EKG was reviewed independently. Rate, rhythm, axis, intervals all examined and without medically relevant abnormality. ST segments without concerns for elevations.    Laboratory  Single troponin demonstrated no acute abnormality  CBC and BMP without obvious metabolic or inflammatory abnormalities requiring further evaluation   Radiology  CT Angio Chest PE W and/or Wo Contrast  Result Date: 06/03/2022 CLINICAL DATA:  High probability for PE.  Chest pain. EXAM: CT ANGIOGRAPHY CHEST WITH CONTRAST TECHNIQUE:  Multidetector CT imaging of the chest was performed using the standard protocol during bolus administration of intravenous contrast. Multiplanar CT image reconstructions and MIPs were obtained to evaluate the vascular anatomy. RADIATION DOSE REDUCTION: This exam was performed according to the departmental dose-optimization program which includes automated exposure control, adjustment of the mA and/or kV according to patient size and/or use of iterative reconstruction technique. CONTRAST:  OMNIPAQUE IOHEXOL 350 MG/ML SOLN COMPARISON:  Chest x-ray same day.  CT angiogram chest 06/16/2021 FINDINGS: Cardiovascular: Satisfactory opacification of the pulmonary arteries to the segmental level. No evidence of pulmonary embolism. Normal heart size. No pericardial effusion. Mediastinum/Nodes: No enlarged mediastinal, hilar, or axillary lymph nodes. Thyroid gland, trachea, and esophagus demonstrate no significant findings. Lungs/Pleura: There is a stable 4 mm nodular density in the right lung apex. The lungs are otherwise clear. No pleural effusion or pneumothorax. Upper Abdomen: No acute abnormality. Musculoskeletal: No chest wall abnormality. No acute or significant osseous findings. Review of the MIP images confirms the above findings. IMPRESSION: 1. No evidence for pulmonary embolism or other acute cardiopulmonary process. 2. Stable 4 mm nodular density in the right lung apex. No follow-up needed if patient is low-risk.This recommendation follows the consensus  statement: Guidelines for Management of Incidental Pulmonary Nodules Detected on CT Images: From the Fleischner Society 2017; Radiology 2017; 284:228-243. Electronically Signed   By: Darliss Cheney M.D.   On: 06/03/2022 15:26   DG Chest 2 View  Result Date: 06/03/2022 CLINICAL DATA:  Chest pain EXAM: CHEST - 2 VIEW COMPARISON:  05/25/22 CXR FINDINGS: No pleural effusion. No pneumothorax. No focal airspace opacity. Normal cardiac and mediastinal contours. No  displaced rib fractures. Visualized upper abdomen is unremarkable. IMPRESSION: No radiographic finding to explain chest pain Electronically Signed   By: Lorenza Cambridge M.D.   On: 06/03/2022 15:08   DG Foot Complete Right  Result Date: 05/27/2022 CLINICAL DATA:  Postop, incision and drainage of abscess and right foot. EXAM: RIGHT FOOT COMPLETE - 3+ VIEW COMPARISON:  05/25/2022. FINDINGS: Status post transmetatarsal amputation of the first through fifth digits. No fracture or dislocation. Air is identified in the soft tissues, compatible with postop history. IMPRESSION: Status post incision and drainage of right foot abscess and transmetatarsal amputation. Electronically Signed   By: Thornell Sartorius M.D.   On: 05/27/2022 20:06   MR FOOT RIGHT W WO CONTRAST  Result Date: 05/26/2022 CLINICAL DATA:  Foot swelling, nondiabetic, osteomyelitis suspected. Transmetatarsal foot amputation between radiographs obtained 12/14/2021 and 01/25/2022. EXAM: MRI OF THE RIGHT FOREFOOT WITHOUT AND WITH CONTRAST TECHNIQUE: Multiplanar, multisequence MR imaging of the right forefoot was performed before and after the administration of intravenous contrast. CONTRAST:  23mL GADAVIST GADOBUTROL 1 MMOL/ML IV SOLN COMPARISON:  Radiographs 05/25/2022 and 03/15/2022.  MRI 03/15/2022. FINDINGS: Bones/Joint/Cartilage Postsurgical changes from transmetatarsal amputations are unchanged from the previous MRI 10 weeks ago. No progressive cortical destruction identified. There is persistent marrow T2 hyperintensity throughout the remaining 1st through 5th metatarsals, sparing the bases, not significantly changed from previous MRI low level marrow enhancement is present in these areas following contrast. The cuneiform bones, cuboid and navicular appear unremarkable. Stable small osteochondral lesion of the talar head. No significant joint effusions. Ligaments Intact Lisfranc ligament. Muscles and Tendons T2 hyperintensity throughout the forefoot  musculature has mildly improved from the previous study. No focal intramuscular fluid collections or significant tenosynovitis. Postsurgical changes within the flexor and extensor tendons related to the previous transmetatarsal amputation. Soft tissues Previously demonstrated ill-defined fluid collections surrounding the distal 2nd and 3rd metatarsals have decreased in size compared with the previous MRI. There is associated soft tissue enhancement surrounding 2nd and 3rd metatarsals. No skin ulceration is apparent. There are complex fluid collections plantar to the distal aspects of the remaining 1st and 5th metatarsals. The 5th metatarsal collection measures up to 1.7 cm on coronal image 20/6 and does not show significant abnormal associated enhancement. There is a small collection plantar to the 1st metatarsal which does show peripheral enhancement. There is generally poor soft tissue enhancement within the lateral aspect of the remaining forefoot, suspicious for soft tissue devitalization. IMPRESSION: 1. Transmetatarsal amputation approximately 5 months ago. Compared with previous MRI of 03/15/2022, there is no significant change in marrow T2 hyperintensity and enhancement within the remaining 1st through 5th metatarsals. These findings are nonspecific but suspicious for chronic osteomyelitis. No progressive cortical destruction identified. 2. New complex fluid collections plantar to the distal aspects of the remaining 1st and 5th metatarsals. The medial component shows peripheral enhancement and could reflect a small abscess, especially if there is a skin wound in this area. The larger lateral component does not show any abnormal enhancement, although there is limited overall soft tissue enhancement laterally in  the remaining forefoot, suspicious for underlying soft tissue devitalization. 3. Interval decrease in size of ill-defined fluid collections surrounding the distal 2nd and 3rd metatarsals with associated  soft tissue enhancement. Electronically Signed   By: Carey Bullocks M.D.   On: 05/26/2022 15:06   DG Foot Complete Right  Result Date: 05/25/2022 CLINICAL DATA:  Questionable sepsis - evaluate for abnormality EXAM: RIGHT FOOT COMPLETE - 3+ VIEW COMPARISON:  X-ray right foot 03/15/2022 FINDINGS: Status post amputation of all toes at the level of distal metatarsal. No cortical erosion or destruction. There is no evidence of fracture or dislocation. There is no evidence of arthropathy or other focal bone abnormality. Soft tissues are unremarkable. IMPRESSION: 1. No radiographic finding of osteomyelitis. If high clinical suspicion, please consider MRI (with intravenous contrast if GFR greater than 30) for further evaluation. 2. Status post amputation of all toes at the level of distal metatarsal. Electronically Signed   By: Tish Frederickson M.D.   On: 05/25/2022 21:00   DG Chest Port 1 View  Result Date: 05/25/2022 CLINICAL DATA:  Questionable sepsis.  Erythema in the leg. EXAM: PORTABLE CHEST 1 VIEW COMPARISON:  None Available. FINDINGS: The heart size and mediastinal contours are within normal limits. Both lungs are clear. The visualized skeletal structures are unremarkable. IMPRESSION: No active disease. Electronically Signed   By: Marin Roberts M.D.   On: 05/25/2022 20:57    Final Assessment and Plan: Evaluated patient at bedside.  He is overall well-appearing at this time asymptomatic.  Had a prolonged conversation with patient.  He has been here for 4 and half hours and would like to be discharged.  Given resolution of symptoms, I believe this is reasonable.  I did recommend repeat troponin testing as his symptoms had only been present for 2 hours after I reinterrogated the patient about his history.  However patient declined repeat troponin testing.  Discussed risk of missed ACS given only 1 troponin test today and patient expressed understanding but since his symptoms have resolved he does  not want to stay for any further testing.  Discharged with strict return precautions reinforced with spouse at bedside understanding all discharge instructions as well.  Information regarding patient's pulmonary nodule was relayed in person and in his AVS.         Clinical Impression:  1. Nonspecific chest pain   2. Pulmonary nodule      Discharge   Final Clinical Impression(s) / ED Diagnoses Final diagnoses:  Nonspecific chest pain  Pulmonary nodule    Rx / DC Orders ED Discharge Orders     None         Glyn Ade, MD 06/03/22 1850

## 2022-06-03 NOTE — Progress Notes (Signed)
Subjective:  Patient ID: George Shelton, male    DOB: 1981-06-29,  MRN: [id number]  Chief Complaint  Patient presents with   Routine Post Op    DOS 05/27/2022 POV #1INCISION AND DRAINAGE ABSCESS    DOS: 05/27/22 Procedure: Right foot TMA site I&D x 3 locations, bone biopsy 1st and 5th metatarsal  41 y.o. male returns for post-op check.  Patient presents for first postoperative visit status post incision and drainage of multiple areas on the right foot TMA site as well as bone biopsy of the first and fifth metatarsal.  He has been changing the dressing himself since the surgery.  He has been putting Betadine and gauze over the amputation site.  He has noted that the medial portion of the TMA seems to be opening with failure of the sutures.  He has been putting Betadine on this area denies much drainage from it.  He is currently taking oral antibiotics in the hospital.  Has not seen infectious disease following this visit.  Has been walking in a postoperative shoe.  Does admit that he has been doing a fair amount of walking since leaving the hospital.  Review of Systems: Negative except as noted in the HPI. Denies N/V/F/Ch.   Objective:  There were no vitals filed for this visit. There is no height or weight on file to calculate BMI. Constitutional Well developed. Well nourished.  Vascular Foot warm and well perfused. Capillary refill normal to all digits.  Calf is soft and supple, no posterior calf or knee pain, negative Homans' sign  Neurologic Normal speech. Oriented to person, place, and time. Epicritic sensation to light touch grossly present bilaterally.  Dermatologic Central and lateral incisions well coapted. Medial incision with dehiscence and central area of open wound. Sutures partially intact. No significant erythema. No drainage or malodor.    Orthopedic: Tenderness to palpation noted about the surgical site.   Multiple view plain film radiographs: None taken at this  visit Assessment:   1. Post-operative state   2. Chronic osteomyelitis of right foot ([place])   3. Abscess of right foot   4. Cellulitis of right foot    Plan:  Patient was evaluated and treated and all questions answered.  S/p foot surgery right foot I&D 3 separate areas and bone biopsy first and fifth metatarsal head. -Discussed with the patient the bone biopsy results of the first and fifth metatarsal head concerning for chronic osteomyelitis.  Bone culture results also demonstrate growth of MRSA. -Concern for dehiscence of the medial I&D incision.  Possibly related to underlying osteomyelitis of the first metatarsal head as well as excessive patient activity/ambulation postoperatively. Central and lateral incisions healing well -XR: Deferred at this visit -WB Status: Weightbearing to heel in postoperative shoe for transfers only want him to decrease his much as he can his weightbearing time. -Sutures: To remain intact until next visit. -Medications: Continue p.o. antibiotics. -Urgent Referral to infectious disease placed at this visit.  He needs to be seen by infectious disease for likely PICC line and prolonged course of IV antibiotics related to residual osteomyelitis in the right foot at the first and fifth metatarsals.  If this fails or he is unable to get long-term IV antibiotics we will have to consider more proximal amputation in the leg. -Foot redressed with Betadine and gauze dressing.  Patient should continue daily dressing changes with Betadine and gauze  Return in about 2 weeks (around 06/17/2022) for Follow-up right TMA wound.  Everitt Amber, DPM [facility]

## 2022-06-07 ENCOUNTER — Encounter: Payer: Self-pay | Admitting: Internal Medicine

## 2022-06-07 ENCOUNTER — Other Ambulatory Visit: Payer: Self-pay

## 2022-06-07 ENCOUNTER — Telehealth: Payer: Self-pay | Admitting: Podiatry

## 2022-06-07 ENCOUNTER — Ambulatory Visit (INDEPENDENT_AMBULATORY_CARE_PROVIDER_SITE_OTHER): Payer: BC Managed Care – PPO | Admitting: Internal Medicine

## 2022-06-07 VITALS — BP 135/81 | HR 91 | Temp 98.6°F | Ht 73.0 in | Wt 189.0 lb

## 2022-06-07 DIAGNOSIS — M86671 Other chronic osteomyelitis, right ankle and foot: Secondary | ICD-10-CM

## 2022-06-07 DIAGNOSIS — L03115 Cellulitis of right lower limb: Secondary | ICD-10-CM

## 2022-06-07 DIAGNOSIS — Z22322 Carrier or suspected carrier of Methicillin resistant Staphylococcus aureus: Secondary | ICD-10-CM | POA: Diagnosis not present

## 2022-06-07 HISTORY — DX: Other chronic osteomyelitis, right ankle and foot: M86.671

## 2022-06-07 LAB — CBC WITH DIFFERENTIAL/PLATELET
Absolute Monocytes: 449 cells/uL (ref 200–950)
Basophils Absolute: 33 cells/uL (ref 0–200)
Basophils Relative: 0.5 %
Eosinophils Absolute: 191 cells/uL (ref 15–500)
Eosinophils Relative: 2.9 %
HCT: 36.7 % — ABNORMAL LOW (ref 38.5–50.0)
Hemoglobin: 12.4 g/dL — ABNORMAL LOW (ref 13.2–17.1)
Lymphs Abs: 891 cells/uL (ref 850–3900)
MCH: 28.6 pg (ref 27.0–33.0)
MCHC: 33.8 g/dL (ref 32.0–36.0)
MCV: 84.8 fL (ref 80.0–100.0)
MPV: 8.2 fL (ref 7.5–12.5)
Monocytes Relative: 6.8 %
Neutro Abs: 5036 cells/uL (ref 1500–7800)
Neutrophils Relative %: 76.3 %
Platelets: 334 10*3/uL (ref 140–400)
RBC: 4.33 10*6/uL (ref 4.20–5.80)
RDW: 13.3 % (ref 11.0–15.0)
Total Lymphocyte: 13.5 %
WBC: 6.6 10*3/uL (ref 3.8–10.8)

## 2022-06-07 LAB — COMPREHENSIVE METABOLIC PANEL
AG Ratio: 1.7 (calc) (ref 1.0–2.5)
ALT: 24 U/L (ref 9–46)
AST: 28 U/L (ref 10–40)
Albumin: 4.3 g/dL (ref 3.6–5.1)
Alkaline phosphatase (APISO): 61 U/L (ref 36–130)
BUN: 20 mg/dL (ref 7–25)
CO2: 27 mmol/L (ref 20–32)
Calcium: 8.9 mg/dL (ref 8.6–10.3)
Chloride: 102 mmol/L (ref 98–110)
Creat: 1.06 mg/dL (ref 0.60–1.29)
Globulin: 2.5 g/dL (calc) (ref 1.9–3.7)
Glucose, Bld: 77 mg/dL (ref 65–99)
Potassium: 4.2 mmol/L (ref 3.5–5.3)
Sodium: 137 mmol/L (ref 135–146)
Total Bilirubin: 0.7 mg/dL (ref 0.2–1.2)
Total Protein: 6.8 g/dL (ref 6.1–8.1)

## 2022-06-07 MED ORDER — LINEZOLID 600 MG PO TABS: 600.0000 mg | ORAL_TABLET | Freq: Two times a day (BID) | ORAL | 0 refills | Status: AC

## 2022-06-07 NOTE — Telephone Encounter (Signed)
Pt called upset that he went to infectious disease today from your  urgent referral and they told him to keep doing what he is doing. He is upset because it is not working and he thinks you and him were on the same page with him getting iv antibiotics but the do not think that. He wanted to let you know he is going for a second opinion in [place] for the bone infection that keeps recurring.But  it is not because of you it is because of infectious disease appt that was today. Please call pt to discuss further.

## 2022-06-07 NOTE — Progress Notes (Signed)
[facility]      Reason for Consult: cellulitis and osteomyelitis Referring Physician: Dr. Loel Lofty    Patient ID: George Shelton, male    DOB: 08-13-1980, 41 y.o.   MRN: [id number]  HPI:   He is here with a new problem for his right foot.   I previously saw him for right 2nd toe osteomyelitis and eventually required TMA in June 2023.  He had an injury to the TMA area in August and was hospitalized and MRI concerning for osteomyelitis and abscess of the TMA site.  Operative exploration did not find any purulence or concerns and bone biopsy was negative.  Pathology did note findings of chronic osteomyelitis and felt to be consistent with chronic, non-infectious inflammation.  He returned to the ED last month with 2 days of streaking up his right leg and drainage from his right foot.  He was taken to the OR by Dr. Loel Lofty 10/26 for exploration and no pus noted but a bone biopsy of the 1st and 5th area now positive for MRSA and pathology c/w similar findings of chronic osteomyelitis.  He was placed on cephalexin and linezolid and here for evaluation for concern for new osteomyelitis.    Past Medical History:  Diagnosis Date   Abscess 04/20/2022   Angina at rest    Cellulitis of right foot 03/15/2022   Crush injury to foot, right, initial encounter 03/15/2022   HSV-1 (herpes simplex virus 1) infection    lip ulcer per Pt   HSV-2 (herpes simplex virus 2) infection    Infection of right wrist ([place]) 07/12/2021   Left atrial enlargement    Medication monitoring encounter 12/30/2021   MRSA (methicillin resistant staph aureus) culture positive    Nicotine dependence    quit July 6,2023   PICC (peripherally inserted central catheter) in place 12/30/2021   Polysubstance abuse ([place])    Sepsis ([place])    Wrist joint infection ([place]) 07/14/2021    Prior to Admission medications   Medication Sig Start Date End Date Taking? Authorizing Provider  albuterol (VENTOLIN  HFA) 108 (90 Base) MCG/ACT inhaler Inhale 2 puffs into the lungs every 4 (four) hours as needed for wheezing or shortness of breath. 03/24/22  Yes [provider]  amphetamine-dextroamphetamine (ADDERALL) 20 MG tablet Take 20 mg by mouth 2 (two) times daily.   Yes [provider]  celecoxib (CELEBREX) 100 MG capsule Take 1 capsule (100 mg total) by mouth 2 (two) times daily. 05/28/22 08/26/22 Yes [place] Eric J, DO  cephALEXin (KEFLEX) 500 MG capsule Take 1 capsule (500 mg total) by mouth 4 (four) times daily for 10 days. 05/28/22 06/07/22 Yes [place] Eric J, DO  FLOVENT HFA 110 MCG/ACT inhaler Inhale 2 puffs into the lungs 2 (two) times daily. 03/25/22  Yes [provider]  gabapentin (NEURONTIN) 300 MG capsule Take 2 capsules (600 mg total) by mouth 3 (three) times daily. 05/28/22 08/26/22 Yes [place] Eric J, DO  linezolid (ZYVOX) 600 MG tablet Take 1 tablet (600 mg total) by mouth 2 (two) times daily for 10 days. 05/28/22 06/07/22 Yes [place] Eric J, DO  nebivolol (BYSTOLIC) 10 MG tablet Take 1 tablet (10 mg total) by mouth daily. 03/18/22 06/16/22 Yes [place] Eric J, DO  pantoprazole (PROTONIX) 40 MG tablet Take 1 tablet (40 mg total) by mouth daily. 05/28/22 08/26/22 Yes [place] Eric J, DO  valACYclovir (VALTREX) 500 MG tablet Take 500 mg by mouth daily. 04/27/22  Yes [provider]    Allergies  Allergen Reactions   Codeine Other (See Comments)    PATIENT DOES NOT WANT THIS   Other Other (See Comments)    History of drug addiction, per the patient- does NOT want pain medication of any kind. Sober/clean for 8 1/2 years.  Patient states "NO OPIATES"!!!   Vancomycin Other (See Comments)    "It kills my white blood cells"- PATIENT SAID HE **WILL NOT, UNDER ANY CIRCUMSTANCES** TAKE THIS    Social History   Tobacco Use   Smoking status: Former    Packs/day: 1.50    Types: Cigarettes    Quit date: 11/2021    Years since quitting: 0.5   Smokeless  tobacco: Former    Types: Nurse, children's Use: Never used  Substance Use Topics   Alcohol use: Not Currently    Comment: occasionally   Drug use: Never    Family History  Problem Relation Age of Onset   Heart attack Father    Hypertension Father    Diabetes Father    Melanoma Father    Sleep apnea Father    Hypertension Maternal Grandmother    Atrial fibrillation Maternal Grandmother    Hypertension Maternal Grandfather    Pancreatic cancer Paternal Grandmother     Review of Systems  Constitutional: negative for fevers and chills All other systems reviewed and are negative    Constitutional: in no apparent distress  Vitals:   06/07/22 1518  BP: 135/81  Pulse: 91  Temp: 98.6 F (37 C)  SpO2: 98%   EYES: anicteric Respiratory: normal respiratory effort Musculoskeletal: right foot TMA with no purulence, no surrounding erythema, no increased warmth.  Good perfusion.   Labs: Lab Results  Component Value Date   WBC 5.5 06/03/2022   HGB 12.5 (L) 06/03/2022   HCT 39.0 06/03/2022   MCV 86.9 06/03/2022   PLT 338 06/03/2022    Lab Results  Component Value Date   CREATININE 1.14 06/03/2022   BUN 21 (H) 06/03/2022   NA 139 06/03/2022   K 4.0 06/03/2022   CL 104 06/03/2022   CO2 26 06/03/2022    Lab Results  Component Value Date   ALT 38 05/26/2022   AST 34 05/26/2022   ALKPHOS 55 05/26/2022   BILITOT 0.5 05/26/2022   INR 0.9 05/25/2022     Assessment: right foot chronic osteomyelitis.  At this point, with bone biopsy findings of MRSA and pathology concerns, will plan to treat for a prolonged period with linezolid.  It is resistant to Bactrim and doxycycline.  Other option will be dalbavancin serially.    Plan: 1)  cmp, cbc today on linezolid 2) refill linezolid for 30 days [place] up in 2 weeks to monitor progress.

## 2022-06-08 DIAGNOSIS — Z5181 Encounter for therapeutic drug level monitoring: Secondary | ICD-10-CM | POA: Diagnosis not present

## 2022-06-08 DIAGNOSIS — A4902 Methicillin resistant Staphylococcus aureus infection, unspecified site: Secondary | ICD-10-CM | POA: Diagnosis not present

## 2022-06-08 DIAGNOSIS — T8130XA Disruption of wound, unspecified, initial encounter: Secondary | ICD-10-CM | POA: Diagnosis not present

## 2022-06-08 DIAGNOSIS — M869 Osteomyelitis, unspecified: Secondary | ICD-10-CM | POA: Diagnosis not present

## 2022-06-09 DIAGNOSIS — M8660 Other chronic osteomyelitis, unspecified site: Secondary | ICD-10-CM | POA: Diagnosis not present

## 2022-06-09 DIAGNOSIS — M869 Osteomyelitis, unspecified: Secondary | ICD-10-CM

## 2022-06-09 HISTORY — DX: Osteomyelitis, unspecified: M86.9

## 2022-06-11 DIAGNOSIS — G4733 Obstructive sleep apnea (adult) (pediatric): Secondary | ICD-10-CM | POA: Diagnosis not present

## 2022-06-11 DIAGNOSIS — J4530 Mild persistent asthma, uncomplicated: Secondary | ICD-10-CM | POA: Diagnosis not present

## 2022-06-11 DIAGNOSIS — R911 Solitary pulmonary nodule: Secondary | ICD-10-CM | POA: Diagnosis not present

## 2022-06-11 DIAGNOSIS — R0600 Dyspnea, unspecified: Secondary | ICD-10-CM | POA: Diagnosis not present

## 2022-06-11 DIAGNOSIS — R5383 Other fatigue: Secondary | ICD-10-CM | POA: Diagnosis not present

## 2022-06-14 ENCOUNTER — Ambulatory Visit (INDEPENDENT_AMBULATORY_CARE_PROVIDER_SITE_OTHER): Payer: BC Managed Care – PPO | Admitting: Podiatry

## 2022-06-14 DIAGNOSIS — L03115 Cellulitis of right lower limb: Secondary | ICD-10-CM

## 2022-06-14 DIAGNOSIS — M86671 Other chronic osteomyelitis, right ankle and foot: Secondary | ICD-10-CM

## 2022-06-14 DIAGNOSIS — L02611 Cutaneous abscess of right foot: Secondary | ICD-10-CM

## 2022-06-14 DIAGNOSIS — Z9889 Other specified postprocedural states: Secondary | ICD-10-CM

## 2022-06-14 NOTE — Progress Notes (Signed)
Subjective:  Patient ID: George Shelton, male    DOB: [birth year],  MRN: [id number]  Chief Complaint  Patient presents with   Routine Post Op    DOS 05/27/2022 POV #2 INCISION AND DRAINAGE ABSCESS.     DOS: 05/27/22 Procedure: Right foot TMA site I&D x 3 locations, bone biopsy 1st and 5th metatarsal  41 y.o. male returns for post-op check.  Patient presents for 2nd postoperative visit status post incision and drainage of multiple areas on the right foot TMA site as well as bone biopsy of the first and fifth metatarsal.  He reports he went to infectious disease as recommended after last visit.  He was not happy with the  infectious disease doctor that he saw.  He went to get a second opinion at another practice associate with [facility].  He has been placed on IV antibiotics as well as oral antibiotics.  He reports that the redness swelling has decreased in the right foot since then.  He is doing daily dressing changes with Betadine and gauze to the right transmetatarsal amputation site.  Reports that he stubbed the right foot recently had a lot of bleeding from the wound that is present on the medial aspect of the amputation site.  Review of Systems: Negative except as noted in the HPI. Denies N/V/F/Ch.   Objective:  There were no vitals filed for this visit. There is no height or weight on file to calculate BMI. Constitutional Well developed. Well nourished.  Vascular Foot warm and well perfused. Capillary refill normal to all digits.  Calf is soft and supple, no posterior calf or knee pain, negative Homans' sign  Neurologic Normal speech. Oriented to person, place, and time. Epicritic sensation to light touch grossly present bilaterally.  Dermatologic Central and lateral incisions well coapted.  Open ulceration present at the medial aspect of the transmetatarsal amputation site.  No significant erythema mild serous drainage.  Eschar and fibrotic tissue is present at the  open ulceration does appear to probe towards the bone.  However overall improved from prior with the use erythema and size of the opening.     Orthopedic: Tenderness to palpation noted about the surgical site.   Multiple view plain film radiographs: None taken at this visit Assessment:   1. Post-operative state   2. Chronic osteomyelitis of right foot (HCC)   3. Abscess of right foot   4. Cellulitis of right foot     Plan:  Patient was evaluated and treated and all questions answered.  S/p foot surgery right foot I&D 3 separate areas and bone biopsy first and fifth metatarsal head. -Chronic osteomyelitis by bone biopsy first and fifth metatarsal head stump site -Patient is open ulceration medial aspect of the TMA site likely related to postoperative dehiscence and infection underlying at the first metatarsal stump site -XR: Deferred at this visit -WB Status: Weightbearing to heel in postoperative shoe for transfers only want him to decrease his much as he can his weightbearing time. -Sutures: Removed at this -Medications: IV and p.o. antibiotics per infectious disease -Foot redressed with Betadine and gauze dressing.  Patient should continue daily dressing changes with Betadine and gauze -I again discussed with the patient that he is at high risk for needing revisional surgery related to the ulceration present the right TMA amp site.  If it fails to heal we will need to consider resecting more bone cleaning up the skin flaps and free closing the area.  We will proceed with  antibiotic and wound care therapy at this time however he is aware that if this fails with a reasonable amount of time we will have to proceed with more for surgical intervention.  Return in about 2 weeks (around 06/28/2022) for follow up R foot wound.         Corinna Gab, DPM [facility] / [facility]

## 2022-06-16 DIAGNOSIS — M8660 Other chronic osteomyelitis, unspecified site: Secondary | ICD-10-CM | POA: Diagnosis not present

## 2022-06-22 ENCOUNTER — Ambulatory Visit: Payer: BC Managed Care – PPO | Admitting: Internal Medicine

## 2022-06-23 DIAGNOSIS — M86171 Other acute osteomyelitis, right ankle and foot: Secondary | ICD-10-CM | POA: Diagnosis not present

## 2022-06-23 DIAGNOSIS — M25571 Pain in right ankle and joints of right foot: Secondary | ICD-10-CM | POA: Diagnosis not present

## 2022-06-23 DIAGNOSIS — F901 Attention-deficit hyperactivity disorder, predominantly hyperactive type: Secondary | ICD-10-CM | POA: Diagnosis not present

## 2022-06-23 DIAGNOSIS — I10 Essential (primary) hypertension: Secondary | ICD-10-CM | POA: Diagnosis not present

## 2022-06-23 DIAGNOSIS — R0602 Shortness of breath: Secondary | ICD-10-CM | POA: Diagnosis not present

## 2022-06-23 DIAGNOSIS — S98212S Complete traumatic amputation of two or more left lesser toes, sequela: Secondary | ICD-10-CM | POA: Diagnosis not present

## 2022-06-23 DIAGNOSIS — L02611 Cutaneous abscess of right foot: Secondary | ICD-10-CM | POA: Diagnosis not present

## 2022-06-23 DIAGNOSIS — J4541 Moderate persistent asthma with (acute) exacerbation: Secondary | ICD-10-CM | POA: Diagnosis not present

## 2022-06-28 ENCOUNTER — Ambulatory Visit (INDEPENDENT_AMBULATORY_CARE_PROVIDER_SITE_OTHER): Payer: BC Managed Care – PPO | Admitting: Podiatry

## 2022-06-28 DIAGNOSIS — M86671 Other chronic osteomyelitis, right ankle and foot: Secondary | ICD-10-CM

## 2022-06-28 DIAGNOSIS — L02611 Cutaneous abscess of right foot: Secondary | ICD-10-CM

## 2022-06-28 DIAGNOSIS — Z9889 Other specified postprocedural states: Secondary | ICD-10-CM

## 2022-06-28 DIAGNOSIS — L03115 Cellulitis of right lower limb: Secondary | ICD-10-CM

## 2022-06-28 NOTE — Progress Notes (Signed)
Subjective:  Patient ID: George Shelton, male    DOB: 10-Dec-1980,  MRN: [id number]  Chief Complaint  Patient presents with   Wound Check    Pt states he has has some pain and swelling , he also states he has noticed some drainage     DOS: 05/27/22 Procedure: Right foot TMA site I&D x 3 locations, bone biopsy 1st and 5th metatarsal  41 y.o. male returns for post-op check.  Patient presents for 3rd postoperative visit status post incision and drainage of multiple areas on the right foot TMA site as well as bone biopsy of the first and fifth metatarsal.  He has been doing Betadine dressing changes to the right foot.  There is still an open wound present at the medial aspect of the TMA site at site of prior incision and drainage.  The wound is improving from prior with decreased maceration however there is still some serous drainage present.  Patient has follow-up with infectious disease later this week.  He is still on antibiotics.  He says that he still has pain at the amputation site similar to prior.  Review of Systems: Negative except as noted in the HPI. Denies N/V/F/Ch.   Objective:  There were no vitals filed for this visit. There is no height or weight on file to calculate BMI. Constitutional Well developed. Well nourished.  Vascular Foot warm and well perfused. Capillary refill normal to all digits.  Calf is soft and supple, no posterior calf or knee pain, negative Homans' sign  Neurologic Normal speech. Oriented to person, place, and time. Epicritic sensation to light touch grossly present bilaterally.  Dermatologic Central and lateral incisions well healed at this time.   Open ulceration present at the medial aspect of the transmetatarsal amputation site.  No significant erythema mild serous drainage.   fibrotic tissue is present at the open ulceration.  Decreased size from prior however overall improved from prior with the use erythema and size of the opening.      Orthopedic: Tenderness to palpation noted about the surgical site.   Multiple view plain film radiographs: None taken at this visit Assessment:   1. Post-operative state   2. Chronic osteomyelitis of right foot ([place])   3. Abscess of right foot   4. Cellulitis of right foot      Plan:  Patient was evaluated and treated and all questions answered.  S/p foot surgery right foot I&D 3 separate areas and bone biopsy first and fifth metatarsal head. -Chronic osteomyelitis by bone biopsy first and fifth metatarsal head stump site -Patient is open ulceration medial aspect of the TMA site likely related to postoperative dehiscence and infection underlying at the first metatarsal stump site -XR: Deferred at this visit -WB Status: Weightbearing to heel in postoperative shoe for transfers only want him to decrease his much as he can his weightbearing time. -Medications: IV and p.o. antibiotics per infectious disease -Debridement of the ulceration present performed with 15 blade scalpel with removal of fibrotic tissue to level of subcutaneous fat. -Foot redressed with Betadine and gauze dressing.  Patient should continue daily dressing changes with Betadine and gauze -Discussed that the wound is improving we will continue with antibiotic and wound care therapy.  If he continues to have an open wound medially at the TMA stump site we will have to consider surgical intervention to include further resection of the first metatarsal incision and debridement of the wound with possible closure versus wound graft versus wound VAC. -We  will give the area another 2 weeks of time with wound care and antibiotics to see if he continues to have improvement before making a decision on further surgical intervention.  Return in about 2 weeks (around 07/12/2022) for POV R TMA I&D, wound care.         Corinna Gab, DPM [facility] / [facility]

## 2022-06-29 DIAGNOSIS — R5383 Other fatigue: Secondary | ICD-10-CM | POA: Diagnosis not present

## 2022-06-29 DIAGNOSIS — R911 Solitary pulmonary nodule: Secondary | ICD-10-CM | POA: Diagnosis not present

## 2022-06-29 DIAGNOSIS — J4530 Mild persistent asthma, uncomplicated: Secondary | ICD-10-CM | POA: Diagnosis not present

## 2022-06-29 DIAGNOSIS — R0600 Dyspnea, unspecified: Secondary | ICD-10-CM | POA: Diagnosis not present

## 2022-06-29 DIAGNOSIS — G4733 Obstructive sleep apnea (adult) (pediatric): Secondary | ICD-10-CM | POA: Diagnosis not present

## 2022-07-01 DIAGNOSIS — M869 Osteomyelitis, unspecified: Secondary | ICD-10-CM | POA: Diagnosis not present

## 2022-07-01 DIAGNOSIS — L02419 Cutaneous abscess of limb, unspecified: Secondary | ICD-10-CM | POA: Diagnosis not present

## 2022-07-01 DIAGNOSIS — T8130XA Disruption of wound, unspecified, initial encounter: Secondary | ICD-10-CM | POA: Diagnosis not present

## 2022-07-01 DIAGNOSIS — A4902 Methicillin resistant Staphylococcus aureus infection, unspecified site: Secondary | ICD-10-CM | POA: Diagnosis not present

## 2022-07-02 DIAGNOSIS — M8660 Other chronic osteomyelitis, unspecified site: Secondary | ICD-10-CM | POA: Diagnosis not present

## 2022-07-09 DIAGNOSIS — M8660 Other chronic osteomyelitis, unspecified site: Secondary | ICD-10-CM | POA: Diagnosis not present

## 2022-07-12 ENCOUNTER — Ambulatory Visit (INDEPENDENT_AMBULATORY_CARE_PROVIDER_SITE_OTHER): Payer: BC Managed Care – PPO | Admitting: Podiatry

## 2022-07-12 DIAGNOSIS — L03115 Cellulitis of right lower limb: Secondary | ICD-10-CM

## 2022-07-12 DIAGNOSIS — L02611 Cutaneous abscess of right foot: Secondary | ICD-10-CM

## 2022-07-12 DIAGNOSIS — Z9889 Other specified postprocedural states: Secondary | ICD-10-CM

## 2022-07-12 DIAGNOSIS — M86671 Other chronic osteomyelitis, right ankle and foot: Secondary | ICD-10-CM

## 2022-07-12 NOTE — Progress Notes (Signed)
Subjective:  Patient ID: George Shelton, male    DOB: 1981/04/05,  MRN: [id number]  Chief Complaint  Patient presents with   Routine Post Op    2 weeks for POV RIGHT TMA I&D, wound care.     DOS: 05/27/22 Procedure: Right foot TMA site I&D x 3 locations, bone biopsy 1st and 5th metatarsal  42 y.o. male returns for post-op check.  Patient presents for 4th postoperative visit status post incision and drainage of multiple areas on the right foot TMA site as well as bone biopsy of the first and fifth metatarsal.  He has been doing Betadine dressing changes to the right foot.  There is still an open wound present at the medial aspect of the TMA site at site of prior incision and drainage.  The wound is improving from prior overall but pt says the wound fluctuates. Patient has follow-up with infectious disease later this week.  He is still on antibiotics including weekly infusions of dalbavancin.  He says that he still has pain at the amputation site similar to prior. Unsure if he wants to proceed with further surgical intervention at this time.   Review of Systems: Negative except as noted in the HPI. Denies N/V/F/Ch.   Objective:  There were no vitals filed for this visit. There is no height or weight on file to calculate BMI. Constitutional Well developed. Well nourished.  Vascular Foot warm and well perfused. Capillary refill normal to all digits.  Calf is soft and supple, no posterior calf or knee pain, negative Homans' sign  Neurologic Normal speech. Oriented to person, place, and time. Epicritic sensation to light touch grossly present bilaterally.  Dermatologic Central and lateral incisions well healed at this time.   Open ulceration present at the medial aspect of the transmetatarsal amputation site.  No significant erythema mild serous drainage.  Decreased fibrotic tissue from prior, wound base granular tissue primarily. Decreased size from prior however overall slightly improved  from prior.     Orthopedic: Tenderness to palpation noted about the surgical site.   Multiple view plain film radiographs: None taken at this visit Assessment:   1. Post-operative state   2. Chronic osteomyelitis of right foot (HCC)   3. Abscess of right foot   4. Cellulitis of right foot       Plan:  Patient was evaluated and treated and all questions answered.  S/p foot surgery right foot I&D 3 separate areas and bone biopsy first and fifth metatarsal head. -Chronic osteomyelitis by bone biopsy first and fifth metatarsal head stump site -Patient is open ulceration medial aspect of the TMA site likely related to postoperative dehiscence and infection underlying at the first metatarsal stump site -XR: Deferred at this visit -WB Status: Weightbearing to heel in postoperative shoe for transfers only want him to decrease his much as he can his weightbearing time. -Medications: IV and p.o. antibiotics per infectious disease -Foot redressed with Betadine and gauze dressing.  Patient should continue daily dressing changes with Betadine and gauze -I discussed with the patient possible surgical intervention and discussed possibly sending the patient to the emergency department as soon as today for admission and possible revision transmetatarsal amputation this week.  Stated he needed some time to think about it and get his affairs in order prior to proceeding with this.  He would like to follow-up with infectious disease and get their thoughts as well. -Overall the wound has been stable and may be slightly improving over the past few  weeks however I am concerned that when he goes off antibiotics will worsen and that he will ultimately fail antibiotic therapy again.  My recommendation would very likely be for revision transmetatarsal amputation with more proximal margins to attempt to eradicate any residual osteomyelitis at the area. -Patient to follow-up in a week and we will again discuss if he  is ready to proceed with further surgical intervention which would likely be my recommendation at that time.  Return in about 1 week (around 07/19/2022) for Postop visit right TMA site I&D.         Corinna Gab, DPM [facility] / [facility]

## 2022-07-13 DIAGNOSIS — T8130XA Disruption of wound, unspecified, initial encounter: Secondary | ICD-10-CM | POA: Diagnosis not present

## 2022-07-13 DIAGNOSIS — A4902 Methicillin resistant Staphylococcus aureus infection, unspecified site: Secondary | ICD-10-CM | POA: Diagnosis not present

## 2022-07-13 DIAGNOSIS — L02419 Cutaneous abscess of limb, unspecified: Secondary | ICD-10-CM | POA: Diagnosis not present

## 2022-07-13 DIAGNOSIS — M869 Osteomyelitis, unspecified: Secondary | ICD-10-CM | POA: Diagnosis not present

## 2022-07-16 DIAGNOSIS — M8660 Other chronic osteomyelitis, unspecified site: Secondary | ICD-10-CM | POA: Diagnosis not present

## 2022-07-19 ENCOUNTER — Encounter: Payer: BC Managed Care – PPO | Admitting: Podiatry

## 2022-07-19 ENCOUNTER — Ambulatory Visit (INDEPENDENT_AMBULATORY_CARE_PROVIDER_SITE_OTHER): Payer: BC Managed Care – PPO | Admitting: Podiatry

## 2022-07-19 DIAGNOSIS — Z9889 Other specified postprocedural states: Secondary | ICD-10-CM

## 2022-07-19 DIAGNOSIS — M86671 Other chronic osteomyelitis, right ankle and foot: Secondary | ICD-10-CM

## 2022-07-19 DIAGNOSIS — L97514 Non-pressure chronic ulcer of other part of right foot with necrosis of bone: Secondary | ICD-10-CM

## 2022-07-19 NOTE — Progress Notes (Unsigned)
  Subjective:  Patient ID: George Shelton, male    DOB: 27-Dec-1980,  MRN: [id number]  No chief complaint on file.   DOS: 05/27/22 Procedure: Right foot TMA site I&D x 3 locations, bone biopsy 1st and 5th metatarsal  41 y.o. male returns for post-op check.  Patient presents for 5th postoperative visit status post incision and drainage of multiple areas on the right foot TMA site as well as bone biopsy of the first and fifth metatarsal.  He has been doing Betadine dressing changes to the right foot.  There is still an open wound present at the medial aspect of the TMA site at site of prior incision and drainage.  The wound is improving from lat visit a week ago. Patient has follow-up with infectious disease later this week for his last IV abx infusion.  He says that he still has pain at the amputation site similar to prior.   Review of Systems: Negative except as noted in the HPI. Denies N/V/F/Ch.   Objective:  There were no vitals filed for this visit. There is no height or weight on file to calculate BMI. Constitutional Well developed. Well nourished.  Vascular Foot warm and well perfused. Capillary refill normal to all digits.  Calf is soft and supple, no posterior calf or knee pain, negative Homans' sign  Neurologic Normal speech. Oriented to person, place, and time. Epicritic sensation to light touch grossly present bilaterally.  Dermatologic Central and lateral incisions well healed at this time.   Open cicular 2-3 cm diameter ulceration present at the medial aspect of the transmetatarsal amputation site.  No significant erythema no drainage.  Decreased fibrotic tissue from prior, wound base granular tissue primarily, hypergranular tissue medial.      Orthopedic: Tenderness to palpation noted about the surgical site.   Multiple view plain film radiographs: None taken at this visit Assessment:   1. Post-operative state   2. 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  Chronic osteomyelitis of right foot (HCC)        Plan:  Patient was evaluated and treated and all questions answered.  S/p foot surgery right foot I&D 3 separate areas and bone biopsy first and fifth metatarsal head. -Chronic osteomyelitis by bone biopsy first and fifth metatarsal head stump site -Open ulceration medial aspect of the TMA site likely related to postoperative dehiscence and infection underlying at the first metatarsal stump site - Wound was debrided with tissue nipper at this time.  -XR: Deferred at this visit -WB Status: Weightbearing to heel in postoperative shoe for transfers only want him to decrease his much as he can his weightbearing time. -Medications: IV and p.o. antibiotics per infectious disease -Foot redressed with Betadine and gauze dressing.  Patient should continue daily dressing changes with Betadine and gauze -Again discussed further surgical debridement/ admission. Pt wishes to defer at this time and continue with local wound care. Aware he may ultimately need revision TMA with further resection of bone.    Return in about 3 weeks (around 08/09/2022) for Follow up Right foot ulcer.         Corinna Gab, DPM [facility] / [facility]

## 2022-07-22 DIAGNOSIS — F901 Attention-deficit hyperactivity disorder, predominantly hyperactive type: Secondary | ICD-10-CM | POA: Diagnosis not present

## 2022-07-22 DIAGNOSIS — Z79899 Other long term (current) drug therapy: Secondary | ICD-10-CM | POA: Diagnosis not present

## 2022-07-22 DIAGNOSIS — S98212S Complete traumatic amputation of two or more left lesser toes, sequela: Secondary | ICD-10-CM | POA: Diagnosis not present

## 2022-07-22 DIAGNOSIS — Z5181 Encounter for therapeutic drug level monitoring: Secondary | ICD-10-CM | POA: Diagnosis not present

## 2022-07-22 DIAGNOSIS — I10 Essential (primary) hypertension: Secondary | ICD-10-CM | POA: Diagnosis not present

## 2022-07-22 DIAGNOSIS — R0602 Shortness of breath: Secondary | ICD-10-CM | POA: Diagnosis not present

## 2022-07-22 DIAGNOSIS — J309 Allergic rhinitis, unspecified: Secondary | ICD-10-CM | POA: Diagnosis not present

## 2022-07-22 DIAGNOSIS — J4541 Moderate persistent asthma with (acute) exacerbation: Secondary | ICD-10-CM | POA: Diagnosis not present

## 2022-07-23 DIAGNOSIS — M8660 Other chronic osteomyelitis, unspecified site: Secondary | ICD-10-CM | POA: Diagnosis not present

## 2022-08-06 ENCOUNTER — Ambulatory Visit (INDEPENDENT_AMBULATORY_CARE_PROVIDER_SITE_OTHER): Payer: BC Managed Care – PPO | Admitting: Podiatry

## 2022-08-06 DIAGNOSIS — M86671 Other chronic osteomyelitis, right ankle and foot: Secondary | ICD-10-CM

## 2022-08-06 DIAGNOSIS — Z9889 Other specified postprocedural states: Secondary | ICD-10-CM

## 2022-08-06 DIAGNOSIS — L97514 Non-pressure chronic ulcer of other part of right foot with necrosis of bone: Secondary | ICD-10-CM

## 2022-08-06 NOTE — Progress Notes (Signed)
  Subjective:  Patient ID: George Shelton, male    DOB: 05-03-1981,  MRN: [id number]  Chief Complaint  Patient presents with   Routine Post Op    POV #5 INCISION AND DRAINAGE ABSCESS    DOS: 05/27/22 Procedure: Right foot TMA site I&D x 3 locations, bone biopsy 1st and 5th metatarsal  42 y.o. male returns for post-op check.  Patient presents for 6th postoperative visit status post incision and drainage of multiple areas on the right foot TMA site as well as bone biopsy of the first and fifth metatarsal.  He reports he has not been dressing the wound and has been cleansing with hydrogen peroxide at night. Does not want it debrided as he thinks it makes it worse. On PO abx per ID not no longer on IV.  There is still an open wound present at the medial aspect of the TMA site at site of prior incision and drainage.  Still having pain at amputation and wound site.   Review of Systems: Negative except as noted in the HPI. Denies N/V/F/Ch.   Objective:  There were no vitals filed for this visit. There is no height or weight on file to calculate BMI. Constitutional Well developed. Well nourished.  Vascular Foot warm and well perfused. Capillary refill normal to all digits.  Calf is soft and supple, no posterior calf or knee pain, negative Homans' sign  Neurologic Normal speech. Oriented to person, place, and time. Epicritic sensation to light touch grossly present bilaterally.  Dermatologic Central and lateral incisions well healed at this time.   Open cicular 2-3 cm diameter ulceration present at the medial aspect of the transmetatarsal amputation site.  No significant erythema no drainage.  Decreased fibrotic tissue from prior, wound base granular tissue primarily, hypergranular tissue medial. Overall no change from prior.      Orthopedic: Tenderness to palpation noted about the surgical site.   Multiple view plain film radiographs: None taken at this visit Assessment:   1. Chronic  osteomyelitis of right foot ([place])   2. 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 Post-operative state     Plan:  Patient was evaluated and treated and all questions answered.  S/p foot surgery right foot I&D 3 separate areas and bone biopsy first and fifth metatarsal head. -Chronic osteomyelitis by bone biopsy first and fifth metatarsal head stump site -Open ulceration medial aspect of the TMA site likely related to postoperative dehiscence and infection underlying at the first metatarsal stump site -Debridement deferred per patient request.  -XR: Deferred at this visit.  -WB Status: Weightbearing to heel in postoperative shoe  -Medications: IV and p.o. antibiotics per infectious disease -Foot redressed with Betadine and gauze dressing.  Patient should continue daily dressing changes with Betadine and gauze -Discussed I do not recommend using hydrogen peroxide to clean the wound or leaving the wound open to air. -Again discussed further surgical debridement/ .  Will order an MRI at this time of the right foot to determine extent of the residual osteomyelitis in the first metatarsal.  Will call the patient if there is a significant amount of osteomyelitis seen on MRI and we will discuss admission to the hospital for revision amputation and probable tendo Achilles lengthening.   Return in about 4 weeks (around 09/03/2022).         Everitt Amber, DPM [facility]

## 2022-08-12 DIAGNOSIS — R0602 Shortness of breath: Secondary | ICD-10-CM | POA: Diagnosis not present

## 2022-08-12 DIAGNOSIS — M25571 Pain in right ankle and joints of right foot: Secondary | ICD-10-CM | POA: Diagnosis not present

## 2022-08-12 DIAGNOSIS — J309 Allergic rhinitis, unspecified: Secondary | ICD-10-CM | POA: Diagnosis not present

## 2022-08-12 DIAGNOSIS — J4541 Moderate persistent asthma with (acute) exacerbation: Secondary | ICD-10-CM | POA: Diagnosis not present

## 2022-08-17 ENCOUNTER — Encounter: Payer: Self-pay | Admitting: Podiatry

## 2022-08-17 ENCOUNTER — Encounter: Payer: BC Managed Care – PPO | Admitting: Podiatry

## 2022-08-17 DIAGNOSIS — T8130XA Disruption of wound, unspecified, initial encounter: Secondary | ICD-10-CM | POA: Diagnosis not present

## 2022-08-17 DIAGNOSIS — M869 Osteomyelitis, unspecified: Secondary | ICD-10-CM | POA: Diagnosis not present

## 2022-08-17 DIAGNOSIS — A4902 Methicillin resistant Staphylococcus aureus infection, unspecified site: Secondary | ICD-10-CM | POA: Diagnosis not present

## 2022-08-18 ENCOUNTER — Ambulatory Visit
Admission: RE | Admit: 2022-08-18 | Discharge: 2022-08-18 | Disposition: A | Discharge status: Home / Self Care | Payer: BC Managed Care – PPO | Source: Ambulatory Visit | Attending: Podiatry | Admitting: Podiatry

## 2022-08-18 DIAGNOSIS — M86171 Other acute osteomyelitis, right ankle and foot: Secondary | ICD-10-CM | POA: Diagnosis not present

## 2022-08-18 DIAGNOSIS — S91301A Unspecified open wound, right foot, initial encounter: Secondary | ICD-10-CM | POA: Diagnosis not present

## 2022-08-18 DIAGNOSIS — M86671 Other chronic osteomyelitis, right ankle and foot: Secondary | ICD-10-CM

## 2022-08-18 DIAGNOSIS — Z9889 Other specified postprocedural states: Secondary | ICD-10-CM | POA: Diagnosis not present

## 2022-08-19 ENCOUNTER — Other Ambulatory Visit (INDEPENDENT_AMBULATORY_CARE_PROVIDER_SITE_OTHER): Payer: BC Managed Care – PPO | Admitting: Podiatry

## 2022-08-19 ENCOUNTER — Telehealth: Payer: Self-pay | Admitting: *Deleted

## 2022-08-19 DIAGNOSIS — M86671 Other chronic osteomyelitis, right ankle and foot: Secondary | ICD-10-CM

## 2022-08-19 NOTE — Progress Notes (Signed)
Called pt to discuss MRI results. 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 Recommend revsiion TMA of the Right foot. Discussed either going to ED or getting apt early next week to get scheduled for this as an outpatient. He will discuss with his wife and call back with plan.

## 2022-08-19 NOTE — Telephone Encounter (Signed)
[facility] calling for MRI results, right foot residual osteomyelitis of the 1st, 5th metatarsals, report is ready to view in epic

## 2022-08-25 DIAGNOSIS — L97509 Non-pressure chronic ulcer of other part of unspecified foot with unspecified severity: Secondary | ICD-10-CM | POA: Diagnosis not present

## 2022-08-25 DIAGNOSIS — T8789 Other complications of amputation stump: Secondary | ICD-10-CM | POA: Diagnosis not present

## 2022-08-25 DIAGNOSIS — Z89431 Acquired absence of right foot: Secondary | ICD-10-CM | POA: Diagnosis not present

## 2022-08-25 DIAGNOSIS — M86671 Other chronic osteomyelitis, right ankle and foot: Secondary | ICD-10-CM | POA: Diagnosis not present

## 2022-09-01 DIAGNOSIS — T8789 Other complications of amputation stump: Secondary | ICD-10-CM | POA: Diagnosis not present

## 2022-09-01 DIAGNOSIS — M86671 Other chronic osteomyelitis, right ankle and foot: Secondary | ICD-10-CM | POA: Diagnosis not present

## 2022-09-01 DIAGNOSIS — L97509 Non-pressure chronic ulcer of other part of unspecified foot with unspecified severity: Secondary | ICD-10-CM | POA: Diagnosis not present

## 2022-09-06 ENCOUNTER — Ambulatory Visit (INDEPENDENT_AMBULATORY_CARE_PROVIDER_SITE_OTHER): Payer: BC Managed Care – PPO | Admitting: Podiatry

## 2022-09-06 DIAGNOSIS — Z91199 Patient's noncompliance with other medical treatment and regimen due to unspecified reason: Secondary | ICD-10-CM

## 2022-09-06 NOTE — Progress Notes (Signed)
Pt was a no show for apt, no charge 

## 2022-09-09 DIAGNOSIS — J4541 Moderate persistent asthma with (acute) exacerbation: Secondary | ICD-10-CM | POA: Diagnosis not present

## 2022-09-09 DIAGNOSIS — R0602 Shortness of breath: Secondary | ICD-10-CM | POA: Diagnosis not present

## 2022-09-09 DIAGNOSIS — I10 Essential (primary) hypertension: Secondary | ICD-10-CM | POA: Diagnosis not present

## 2022-09-09 DIAGNOSIS — J309 Allergic rhinitis, unspecified: Secondary | ICD-10-CM | POA: Diagnosis not present

## 2022-09-13 DIAGNOSIS — T8131XA Disruption of external operation (surgical) wound, not elsewhere classified, initial encounter: Secondary | ICD-10-CM | POA: Diagnosis not present

## 2022-09-13 DIAGNOSIS — M86671 Other chronic osteomyelitis, right ankle and foot: Secondary | ICD-10-CM | POA: Diagnosis not present

## 2022-09-13 DIAGNOSIS — T8789 Other complications of amputation stump: Secondary | ICD-10-CM | POA: Diagnosis not present

## 2022-09-13 DIAGNOSIS — L03115 Cellulitis of right lower limb: Secondary | ICD-10-CM | POA: Diagnosis not present

## 2022-09-14 NOTE — Telephone Encounter (Signed)
Error

## 2022-09-16 DIAGNOSIS — Z20828 Contact with and (suspected) exposure to other viral communicable diseases: Secondary | ICD-10-CM | POA: Diagnosis not present

## 2022-09-16 DIAGNOSIS — U071 COVID-19: Secondary | ICD-10-CM | POA: Diagnosis not present

## 2022-09-16 DIAGNOSIS — J028 Acute pharyngitis due to other specified organisms: Secondary | ICD-10-CM | POA: Diagnosis not present

## 2022-09-16 DIAGNOSIS — J020 Streptococcal pharyngitis: Secondary | ICD-10-CM | POA: Diagnosis not present

## 2022-09-16 DIAGNOSIS — Z112 Encounter for screening for other bacterial diseases: Secondary | ICD-10-CM | POA: Diagnosis not present

## 2022-09-20 DIAGNOSIS — L97428 Non-pressure chronic ulcer of left heel and midfoot with other specified severity: Secondary | ICD-10-CM | POA: Diagnosis not present

## 2022-09-20 DIAGNOSIS — T8131XA Disruption of external operation (surgical) wound, not elsewhere classified, initial encounter: Secondary | ICD-10-CM | POA: Diagnosis not present

## 2022-09-20 DIAGNOSIS — E11621 Type 2 diabetes mellitus with foot ulcer: Secondary | ICD-10-CM | POA: Diagnosis not present

## 2022-09-23 DIAGNOSIS — A4902 Methicillin resistant Staphylococcus aureus infection, unspecified site: Secondary | ICD-10-CM | POA: Diagnosis not present

## 2022-09-23 DIAGNOSIS — T8130XA Disruption of wound, unspecified, initial encounter: Secondary | ICD-10-CM | POA: Diagnosis not present

## 2022-09-23 DIAGNOSIS — L03113 Cellulitis of right upper limb: Secondary | ICD-10-CM | POA: Diagnosis not present

## 2022-09-23 DIAGNOSIS — M869 Osteomyelitis, unspecified: Secondary | ICD-10-CM | POA: Diagnosis not present

## 2022-09-24 DIAGNOSIS — M8660 Other chronic osteomyelitis, unspecified site: Secondary | ICD-10-CM | POA: Diagnosis not present

## 2022-10-01 DIAGNOSIS — L97512 Non-pressure chronic ulcer of other part of right foot with fat layer exposed: Secondary | ICD-10-CM | POA: Insufficient documentation

## 2022-10-01 DIAGNOSIS — Z89431 Acquired absence of right foot: Secondary | ICD-10-CM | POA: Insufficient documentation

## 2022-10-01 DIAGNOSIS — M86671 Other chronic osteomyelitis, right ankle and foot: Secondary | ICD-10-CM | POA: Diagnosis not present

## 2022-10-01 DIAGNOSIS — Z1330 Encounter for screening examination for mental health and behavioral disorders, unspecified: Secondary | ICD-10-CM | POA: Diagnosis not present

## 2022-10-01 DIAGNOSIS — M8660 Other chronic osteomyelitis, unspecified site: Secondary | ICD-10-CM | POA: Diagnosis not present

## 2022-10-01 HISTORY — DX: Acquired absence of right foot: Z89.431

## 2022-10-01 HISTORY — DX: Non-pressure chronic ulcer of other part of right foot with fat layer exposed: L97.512

## 2022-10-05 NOTE — Progress Notes (Unsigned)
Cardiology Office Note:    Date:  10/05/2022   ID:  George Shelton, DOB 07-27-1981, MRN [id number]  PCP:  Martin Majestic, FNP   [facility] Providers Cardiologist:  None { Click to update primary MD,subspecialty MD or APP then REFRESH:1}    Referring MD: Martin Majestic, *   Chief Complaint: ***  History of Present Illness:    George Shelton is a *** 42 y.o. male with a hx of polysubstance abuse, previous endocarditis with recurrent MRSA bacteremia, and sinus tachycardia.  Referred to cardiology for endocarditis. Per Dr. Joya Gaskins notes, he did not have records to review but impressive history of recurrent MRSA bacteremia and endocarditis 9 years prior to 2022. He reported sobriety since the last hospital admission.   He had an echocardiogram at [facility] 05/27/2021 for shortness of breath which revealed normal LV EF 55 to 31%, normal diastolic function, normal RV, no significant valvular abnormality.  An event monitor applied at [facility] internal medicine for 14 days showed no episodes of atrial fibrillation or SVT, no bradycardia or pauses, no episodes of heart block, and no findings of ventricular tachycardia. Supraventricular ectopy burden rare < 1%, symptomatic events associated with sinus tachycardia.  Chest CT 06/2021 showed no findings of pulmonary embolism, cardiovascular structures were normal, small pulmonary nodules in the lungs otherwise normal.  Myocardial perfusion study 07/23/2021 showed normal LV function at 59% and normal myocardial perfusion.  Admission at [facility] 07/12/2021 for 3 days with infection of the right wrist and joints treated with IV and oral antibiotics.  He underwent orthopedic debridement of the right hand and seen by infectious disease.  Hepatitis C serology was abnormal.  CBC showed elevated WBC 16,400.  Last cardiology clinic visit was with Dr. Bettina Gavia on 10/06/2021 at which time he had no further shortness of breath or  other cardiac concerns. Rapid HR improved on beta-blocker.   Today, he is here   Past Medical History:  Diagnosis Date   Abscess 04/20/2022   Angina at rest    Cellulitis of right foot 03/15/2022   Crush injury to foot, right, initial encounter 03/15/2022   HSV-1 (herpes simplex virus 1) infection    lip ulcer per Pt   HSV-2 (herpes simplex virus 2) infection    Infection of right wrist ([place]) 07/12/2021   Left atrial enlargement    Medication monitoring encounter 12/30/2021   MRSA (methicillin resistant staph aureus) culture positive    Nicotine dependence    quit July 6,2023   PICC (peripherally inserted central catheter) in place 12/30/2021   Polysubstance abuse ([place])    Sepsis ([place])    Wrist joint infection ([place]) 07/14/2021    Past Surgical History:  Procedure Laterality Date   INCISION AND DRAINAGE ABSCESS Right 05/27/2022   Procedure: INCISION AND DRAINAGE ABSCESS;  Surgeon: Yevonne Pax, DPM;  Location: [facility] OR[facility];  Service: Podiatry;  Laterality: Right;   IRRIGATION AND DEBRIDEMENT ABSCESS Right 04/20/2022   Procedure: IRRIGATION AND DEBRIDEMENT groinABSCESS;  Surgeon: Ralene Ok, MD;  Location: [facility];  Service: General;  Laterality: Right;   TOE AMPUTATION     Big Toe   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  Allergies:   Codeine, Other, and Vancomycin   Social History   Socioeconomic History   Marital status: Married    Spouse name: Not on file   Number of children: Not on file   Years of education: Not on file   Highest education level: Not on file  Occupational History   Not on file  Tobacco Use   Smoking  status: Former    Packs/day: 1.50    Types: Cigarettes    Quit date: 11/2021    Years since quitting: 0.8   Smokeless tobacco: Former    Types: Nurse, children's Use: Never used  Substance and Sexual Activity   Alcohol use: Not Currently    Comment: occasionally   Drug use: Never   Sexual activity: Not on file  Other Topics Concern   Not on file  Social History Narrative   Not on file   Social Determinants of Health   Financial Resource Strain: Not on file  Food Insecurity: No Food Insecurity (05/26/2022)   Hunger Vital Sign    Worried About Running Out of Food in the Last Year: Never true    Ran Out of Food in the Last Year: Never true  Transportation Needs: No Transportation Needs (05/26/2022)   PRAPARE - Hydrologist (Medical): No    Lack of Transportation (Non-Medical): No  Physical Activity: Not on file  Stress: Not on file  Social Connections: Not on file     Family History: The patient's ***family history includes Atrial fibrillation in his maternal grandmother; Diabetes in his father; Heart attack in his father; Hypertension in his father, maternal grandfather, and maternal grandmother; Melanoma in his father; Pancreatic cancer in his paternal grandmother; Sleep apnea in his father.  ROS:   Please see the history of present illness.    *** All other systems reviewed and are negative.  Labs/Other Studies Reviewed:    The following studies were reviewed today: Cardiac Studies & Procedures     STRESS TESTS  MYOCARDIAL PERFUSION IMAGING 07/24/2021  Narrative   Findings are consistent with no ischemia and no prior myocardial infarction. The study is low risk.   No ST deviation was noted.   Left ventricular function is normal. Nuclear stress EF: 59 %. The left ventricular ejection fraction is normal (55-65%). End diastolic cavity size is normal.   Prior study not available for comparison.   Diaphragmatic attenuation  noted.              Echo [facility] 05/27/21  LVEF 55-60%, no rwma Normal diastolic filling, trace MR Normal RV  Recent Labs: 03/16/2022: Magnesium 1.8 05/25/2022: B Natriuretic Peptide 27.4 06/07/2022: ALT 24; BUN 20; Creat 1.06; Hemoglobin 12.4; Platelets 334; Potassium 4.2; Sodium 137  Recent Lipid Panel No results found for: "CHOL", "TRIG", "HDL", "CHOLHDL", "VLDL", "LDLCALC", "LDLDIRECT"   Risk Assessment/Calculations:   {Does this patient have ATRIAL FIBRILLATION?:[phone]}       Physical Exam:    VS:  There were no vitals taken for this visit.    Wt Readings from Last 3 Encounters:  06/07/22 189 lb (85.7 kg)  06/03/22 194 lb 14.2 oz (88.4 kg)  05/27/22 194 lb 14.2 oz (88.4 kg)     GEN: *** Well nourished, well developed in no acute distress HEENT: Normal NECK: No JVD; No carotid bruits CARDIAC: ***RRR, no murmurs, rubs, gallops RESPIRATORY:  Clear to auscultation without rales, wheezing or rhonchi  ABDOMEN: Soft, non-tender, non-distended  MUSCULOSKELETAL:  No edema; No deformity. *** pedal pulses, ***bilaterally SKIN: Warm and dry NEUROLOGIC:  Alert and oriented x 3 PSYCHIATRIC:  Normal affect   EKG:  EKG is *** ordered today.  The ekg ordered today demonstrates ***  No BP recorded.  {Refresh Note OR Click here to enter BP  :1}***    Diagnoses:    No diagnosis found. Assessment and Plan:     Sinus tachycardia: Endocarditis:  {Are you ordering a CV Procedure (e.g. stress test, cath, DCCV, TEE, etc)?   Press F2        :[id number]}   Disposition:  Medication Adjustments/Labs and Tests Ordered: Current medicines are reviewed at length with the patient today.  Concerns regarding medicines are outlined above.  No orders of the defined types were placed in this encounter.  No orders of the defined types were placed in this encounter.   There are no Patient Instructions on file for this visit.   Signed, Emmaline Life, NP  10/05/2022 8:17  AM    [place]

## 2022-10-11 ENCOUNTER — Encounter: Payer: Self-pay | Admitting: Nurse Practitioner

## 2022-10-11 ENCOUNTER — Other Ambulatory Visit: Payer: Self-pay | Admitting: *Deleted

## 2022-10-11 ENCOUNTER — Ambulatory Visit: Payer: BC Managed Care – PPO | Attending: Nurse Practitioner | Admitting: Nurse Practitioner

## 2022-10-11 VITALS — BP 160/90 | HR 83 | Ht 73.0 in | Wt 189.4 lb

## 2022-10-11 DIAGNOSIS — R072 Precordial pain: Secondary | ICD-10-CM

## 2022-10-11 DIAGNOSIS — R0602 Shortness of breath: Secondary | ICD-10-CM | POA: Diagnosis not present

## 2022-10-11 DIAGNOSIS — I10 Essential (primary) hypertension: Secondary | ICD-10-CM | POA: Diagnosis not present

## 2022-10-11 DIAGNOSIS — Z8679 Personal history of other diseases of the circulatory system: Secondary | ICD-10-CM

## 2022-10-11 MED ORDER — METOPROLOL TARTRATE 100 MG PO TABS: ORAL_TABLET | ORAL | 0 refills | Status: DC

## 2022-10-11 MED ORDER — VALSARTAN 40 MG PO TABS: 40.0000 mg | ORAL_TABLET | Freq: Every day | ORAL | 3 refills | Status: DC

## 2022-10-11 NOTE — Patient Instructions (Signed)
Medication Instructions:   START Valsartan one (1) tablet by mouth ( 40 mg) daily.   *If you need a refill on your cardiac medications before your next appointment, please call your pharmacy*   Lab Work:  Your physician recommends that you return for lab work in [place] on Tuesday, March 19. Between 8-4 not between 12-1.    If you have labs (blood work) drawn today and your tests are completely normal, you will receive your results only by: [place] (if you have MyChart) OR A paper copy in the mail If you have any lab test that is abnormal or we need to change your treatment, we will call you to review the results.   Testing/Procedures: [facility]   Your physician has requested that you have an echocardiogram. Echocardiography is a painless test that uses sound waves to create images of your heart. It provides your doctor with information about the size and shape of your heart and how well your heart's chambers and valves are working. This procedure takes approximately one hour. There are no restrictions for this procedure. Please do NOT wear cologne, aftershave, or lotions (deodorant is allowed). Please arrive 15 minutes prior to your appointment time.    Your cardiac CT will be scheduled at one of the below locations:   [facility] [address] [place], [place] [address] [phone]  If scheduled at [facility], please arrive at the [facility] Entrance (Entrance C2) of [facility] 30 minutes prior to test start time. You can use the FREE valet parking offered at entrance C (encouraged to control the heart rate for the test)  Proceed to the [facility] Radiology Department (first floor) to check-in and test prep.  All radiology patients and guests should use entrance C2 at [facility], accessed from [facility], even though the hospital's physical address listed is [address].    Please  follow these instructions carefully (unless otherwise directed):  Hold all erectile dysfunction medications at least 3 days (72 hrs) prior to test. (Ie viagra, cialis, sildenafil, tadalafil, etc) We will administer nitroglycerin during this exam.   On the Night Before the Test: Be sure to Drink plenty of water. Do not consume any caffeinated/decaffeinated beverages or chocolate 12 hours prior to your test. Do not take any antihistamines 12 hours prior to your test. On the Day of the Test: Drink plenty of water until 1 hour prior to the test. Do not eat any food 1 hour prior to test. You may take your regular medications prior to the test.  Take metoprolol (Lopressor) ONE TABLET ( 100 MG) two hours prior to test.       After the Test: Drink plenty of water. After receiving IV contrast, you may experience a mild flushed feeling. This is normal. On occasion, you may experience a mild rash up to 24 hours after the test. This is not dangerous. If this occurs, you can take Benadryl 25 mg and increase your fluid intake. If you experience trouble breathing, this can be serious. If it is severe call 911 IMMEDIATELY. If it is mild, please call our office.  We will call to schedule your test 2-4 weeks out understanding that some insurance companies will need an authorization prior to the service being performed.   For non-scheduling related questions, please contact the cardiac imaging nurse navigator should you have any questions/concerns: Marchia Bond, Cardiac Imaging Nurse Navigator Gordy Clement, Cardiac Imaging  Nurse [place] [facility] Direct Office Dial: [phone]   For scheduling needs, including cancellations and rescheduling, please call [place], [phone].     Follow-Up: At [facility], you and your health needs are our priority.  As part of our continuing mission to provide you with exceptional heart care, we have created designated  Provider Care Teams.  These Care Teams include your primary Cardiologist (physician) and Advanced Practice Providers (APPs -  Physician Assistants and Nurse Practitioners) who all work together to provide you with the care you need, when you need it.  We recommend signing up for the patient portal called "MyChart".  Sign up information is provided on this After Visit Summary.  MyChart is used to connect with patients for Virtual Visits (Telemedicine).  Patients are able to view lab/test results, encounter notes, upcoming appointments, etc.  Non-urgent messages can be sent to your provider as well.   To learn more about what you can do with MyChart, go to [email or web address].    Your next appointment:   2 month(s)  Provider:   Christen Bame, NP     Then, Dr. Zannie Cove plan to see you again in 4 month(s).    Other Instructions   HOW TO TAKE YOUR BLOOD PRESSURE  Rest 5 minutes before taking your blood pressure. Don't  smoke or drink caffeinated beverages for at least 30 minutes before. Take your blood pressure before (not after) you eat. Sit comfortably with your back supported and both feet on the floor ( don't cross your legs). Elevate your arm to heart level on a table or a desk. Use the proper sized cuff.  It should fit smoothly and snugly around your bare upper arm.  There should be  Enough room to slip a fingertip under the cuff.  The bottom edge of the cuff should be 1 inch above the crease Of the elbow. Please monitor your blood pressure once daily 2 hours after your am medication. If you blood pressure Consistently remains above [id number] (systolic) top number or over 80 ( diastolic) bottom number X 3 days  Consecutively.  Please call our office at [phone] or send Mychart message.     ----Avoid cold medicines with D or DM at the end of them----  Your physician wants you to follow-up in: 6 months with Dr. Bettina Gavia.  You will receive a reminder letter in the mail two  months in advance. If you don't receive a letter, please call our office to schedule the follow-up appointment.

## 2022-10-12 ENCOUNTER — Other Ambulatory Visit: Payer: Self-pay | Admitting: Nurse Practitioner

## 2022-10-12 ENCOUNTER — Encounter: Payer: Self-pay | Admitting: Nurse Practitioner

## 2022-10-12 DIAGNOSIS — M8660 Other chronic osteomyelitis, unspecified site: Secondary | ICD-10-CM | POA: Diagnosis not present

## 2022-10-13 DIAGNOSIS — E782 Mixed hyperlipidemia: Secondary | ICD-10-CM | POA: Diagnosis not present

## 2022-10-13 DIAGNOSIS — Z5181 Encounter for therapeutic drug level monitoring: Secondary | ICD-10-CM | POA: Diagnosis not present

## 2022-10-13 DIAGNOSIS — Z79899 Other long term (current) drug therapy: Secondary | ICD-10-CM | POA: Diagnosis not present

## 2022-10-13 DIAGNOSIS — S98212S Complete traumatic amputation of two or more left lesser toes, sequela: Secondary | ICD-10-CM | POA: Diagnosis not present

## 2022-10-13 DIAGNOSIS — J4541 Moderate persistent asthma with (acute) exacerbation: Secondary | ICD-10-CM | POA: Diagnosis not present

## 2022-10-13 DIAGNOSIS — F901 Attention-deficit hyperactivity disorder, predominantly hyperactive type: Secondary | ICD-10-CM | POA: Diagnosis not present

## 2022-10-13 DIAGNOSIS — J309 Allergic rhinitis, unspecified: Secondary | ICD-10-CM | POA: Diagnosis not present

## 2022-10-13 DIAGNOSIS — I10 Essential (primary) hypertension: Secondary | ICD-10-CM | POA: Diagnosis not present

## 2022-10-13 DIAGNOSIS — R0602 Shortness of breath: Secondary | ICD-10-CM | POA: Diagnosis not present

## 2022-10-18 ENCOUNTER — Telehealth (HOSPITAL_COMMUNITY): Payer: Self-pay | Admitting: Emergency Medicine

## 2022-10-18 DIAGNOSIS — R079 Chest pain, unspecified: Secondary | ICD-10-CM

## 2022-10-18 MED ORDER — METOPROLOL TARTRATE 100 MG PO TABS: ORAL_TABLET | ORAL | 0 refills | Status: DC

## 2022-10-18 NOTE — Telephone Encounter (Signed)
Reaching out to patient to offer assistance regarding upcoming cardiac imaging study; pt verbalizes understanding of appt date/time, parking situation and where to check in, pre-test NPO status and medications ordered, and verified current allergies; name and call back number provided for further questions should they arise Kaimani Clayson RN Navigator Cardiac Imaging [place] [facility] [phone] office [phone] cell  Arrival [address] entrance  100mg metoprolol tartrate  Denies iv issues Aware contrast/nitro  

## 2022-10-19 ENCOUNTER — Ambulatory Visit (HOSPITAL_COMMUNITY)
Admission: RE | Admit: 2022-10-19 | Discharge: 2022-10-19 | Disposition: A | Discharge status: Home / Self Care | Payer: BC Managed Care – PPO | Source: Ambulatory Visit | Attending: Nurse Practitioner | Admitting: Nurse Practitioner

## 2022-10-19 DIAGNOSIS — R072 Precordial pain: Secondary | ICD-10-CM | POA: Diagnosis not present

## 2022-10-19 DIAGNOSIS — M8660 Other chronic osteomyelitis, unspecified site: Secondary | ICD-10-CM | POA: Diagnosis not present

## 2022-10-19 MED ORDER — METOPROLOL TARTRATE 5 MG/5ML IV SOLN
INTRAVENOUS | Status: AC
  Administered 2022-10-19: 10 mg via INTRAVENOUS
  Filled 2022-10-19: qty 10

## 2022-10-19 MED ORDER — NITROGLYCERIN 0.4 MG SL SUBL
SUBLINGUAL_TABLET | SUBLINGUAL | Status: AC
  Filled 2022-10-19: qty 2

## 2022-10-19 MED ORDER — IOHEXOL 350 MG/ML SOLN
100.0000 mL | Freq: Once | INTRAVENOUS | Status: AC | PRN
  Administered 2022-10-19: 100 mL via INTRAVENOUS

## 2022-10-19 MED ORDER — NITROGLYCERIN 0.4 MG SL SUBL
0.8000 mg | SUBLINGUAL_TABLET | Freq: Once | SUBLINGUAL | Status: AC
  Administered 2022-10-19: 0.8 mg via SUBLINGUAL

## 2022-10-19 MED ORDER — METOPROLOL TARTRATE 5 MG/5ML IV SOLN: 10.0000 mg | INTRAVENOUS | Status: AC | PRN

## 2022-10-25 ENCOUNTER — Ambulatory Visit: Payer: BC Managed Care – PPO | Attending: Nurse Practitioner

## 2022-10-25 DIAGNOSIS — R072 Precordial pain: Secondary | ICD-10-CM

## 2022-10-25 DIAGNOSIS — Z8679 Personal history of other diseases of the circulatory system: Secondary | ICD-10-CM | POA: Diagnosis not present

## 2022-10-25 LAB — ECHOCARDIOGRAM COMPLETE: S' Lateral: 3.3 cm

## 2022-11-22 DIAGNOSIS — J4541 Moderate persistent asthma with (acute) exacerbation: Secondary | ICD-10-CM | POA: Diagnosis not present

## 2022-11-22 DIAGNOSIS — Z79899 Other long term (current) drug therapy: Secondary | ICD-10-CM | POA: Diagnosis not present

## 2022-11-22 DIAGNOSIS — J309 Allergic rhinitis, unspecified: Secondary | ICD-10-CM | POA: Diagnosis not present

## 2022-11-22 DIAGNOSIS — S98212S Complete traumatic amputation of two or more left lesser toes, sequela: Secondary | ICD-10-CM | POA: Diagnosis not present

## 2022-11-22 DIAGNOSIS — F901 Attention-deficit hyperactivity disorder, predominantly hyperactive type: Secondary | ICD-10-CM | POA: Diagnosis not present

## 2022-11-22 DIAGNOSIS — I10 Essential (primary) hypertension: Secondary | ICD-10-CM | POA: Diagnosis not present

## 2022-11-22 DIAGNOSIS — Z5181 Encounter for therapeutic drug level monitoring: Secondary | ICD-10-CM | POA: Diagnosis not present

## 2022-11-22 DIAGNOSIS — R0602 Shortness of breath: Secondary | ICD-10-CM | POA: Diagnosis not present

## 2022-12-08 DIAGNOSIS — M869 Osteomyelitis, unspecified: Secondary | ICD-10-CM | POA: Diagnosis not present

## 2022-12-08 DIAGNOSIS — A4902 Methicillin resistant Staphylococcus aureus infection, unspecified site: Secondary | ICD-10-CM | POA: Diagnosis not present

## 2022-12-09 DIAGNOSIS — M8660 Other chronic osteomyelitis, unspecified site: Secondary | ICD-10-CM | POA: Diagnosis not present

## 2022-12-21 NOTE — Progress Notes (Deleted)
Cardiology Office Note:    Date:  12/21/2022   ID:  George Shelton, DOB 08-29-1980, MRN [id number]  PCP:  George Jack, FNP   [facility] Providers Cardiologist:  None     Referring MD: George Shelton, *   Chief Complaint: follow-up endocarditis, chest pain  History of Present Illness:    George Shelton is a 42 y.o. male with a hx of polysubstance abuse, previous endocarditis with recurrent MRSA bacteremia, and sinus tachycardia.  Referred to cardiology for endocarditis. Per Dr. Hulen Shouts notes, he did not have records to review but impressive history of recurrent MRSA bacteremia and endocarditis 9 years prior to 2022. He reported sobriety since the last hospital admission.   He had an echocardiogram at [facility] 05/27/2021 for shortness of breath which revealed normal LV EF 55 to 60%, normal diastolic function, normal RV, no significant valvular abnormality.  An event monitor applied at [facility] internal medicine for 14 days showed no episodes of atrial fibrillation or SVT, no bradycardia or pauses, no episodes of heart block, and no findings of ventricular tachycardia. Supraventricular ectopy burden rare < 1%, symptomatic events associated with sinus tachycardia.  Chest CT 06/2021 showed no findings of pulmonary embolism, cardiovascular structures were normal, small pulmonary nodules in the lungs otherwise normal.  Myocardial perfusion study 07/23/2021 showed normal LV function at 59% and normal myocardial perfusion.  Admission at [facility] 07/12/2021 for 3 days with infection of the right wrist and joints treated with IV and oral antibiotics. He underwent orthopedic debridement of the right hand and seen by infectious disease. Hepatitis C serology was abnormal. CBC showed elevated WBC 16,400.  Seen in cardiology clinic by Dr. Dulce Sellar on 10/06/2021 at which time he had no further shortness of breath or other cardiac concerns. Rapid HR improved on  beta-blocker.   Seen in clinic by me on 10/11/22 for clearance to undergo hyperbaric chamber for osteomyelitis of right foot. He is very anxious and it is difficult to keep him on task in regards to describing symptoms. 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 Does not want to have foot amputated, it has been suggested he try hyperbaric chamber. States he has some sort of discomfort all of the time. Several months ago he felt severe chest heaviness, went to hospital and no indication of ACS. "Felt like a [employer] was on my chest." No additional episodes like that. Frequently feels shortness of breath but does not describe increased dyspnea on exertion. Symptoms occur randomly. Has occasional palpitations and at times feels tingling and numbness in lips. No weakness like what he felt when he had endocarditis. Mild bilateral LE edema at the end of the day. He denies orthopnea, PND, presyncope, syncope. Cardiac testing was recommended due to symptoms.  Echo 10/25/2022 revealed normal LVEF to 65%, no RWMA, G1 DD, normal RV, no significant valve disease. Coronary CTA 10/22/2022 revealed coronary calcium score of 0, no evidence of CAD.   Today, he is here    Past Medical History:  Diagnosis Date   Abscess 04/20/2022   Angina at rest    Cellulitis of right foot 03/15/2022   Crush injury to foot, right, initial encounter 03/15/2022   HSV-1 (herpes simplex virus 1) infection    lip ulcer per Pt   HSV-2 (herpes simplex virus 2) infection    Infection of right wrist (HCC)  07/12/2021   Left atrial enlargement    Medication monitoring encounter 12/30/2021   MRSA (methicillin resistant staph aureus) culture positive    Nicotine dependence    quit July 6,2023   PICC (peripherally inserted central catheter) in place 12/30/2021   Polysubstance abuse (HCC)    Sepsis (HCC)    Wrist joint infection  (HCC) 07/14/2021    Past Surgical History:  Procedure Laterality Date   INCISION AND DRAINAGE ABSCESS Right 05/27/2022   Procedure: INCISION AND DRAINAGE ABSCESS;  Surgeon: Pilar Plate, DPM;  Location: [facility];  Service: Podiatry;  Laterality: Right;   IRRIGATION AND DEBRIDEMENT ABSCESS Right 04/20/2022   Procedure: IRRIGATION AND DEBRIDEMENT groinABSCESS;  Surgeon: Axel Filler, MD;  Location: [facility];  Service: General;  Laterality: Right;   TOE AMPUTATION     Big Toe   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     Allergies:   Codeine, Other, Shrimp flavor, and Vancomycin   Social History   Socioeconomic History   Marital status: Married    Spouse name: Not on file   Number of children: Not on file   Years of education: Not on file   Highest education level: Not on file  Occupational History   Not on file  Tobacco Use   Smoking status: Former    Packs/day: 1.5    Types: Cigarettes    Quit date: 11/2021    Years since quitting: 1.0   Smokeless tobacco: Former    Types: Associate Professor Use: Never used  Substance and Sexual Activity   Alcohol use: Not Currently    Comment: occasionally   Drug use: Never   Sexual activity: Not on file  Other Topics Concern   Not on file  Social History Narrative   Not on file   Social Determinants of Health   Financial Resource Strain: Not on file  Food Insecurity: No Food Insecurity (05/26/2022)   Hunger Vital Sign    Worried About Running Out of Food in the Last Year: Never true    Ran Out of Food in the Last Year: Never true  Transportation Needs: No Transportation  Needs (05/26/2022)   PRAPARE - Administrator, Civil Service (Medical): No    Lack of Transportation (Non-Medical): No  Physical Activity: Not on file  Stress: Not on file  Social Connections: Not on file     Family History: The patient's family history includes Atrial fibrillation in his maternal grandmother; Diabetes in his father; Heart attack in his father; Hypertension in his father, maternal grandfather, and maternal grandmother; Melanoma in his father; Pancreatic cancer in his paternal grandmother; Sleep apnea in his father.  ROS:   Please see the history of present illness.  + shortness of breath + chest pain + palpitations + lip tingling All other systems reviewed and are negative.  Labs/Other Studies Reviewed:    The following studies were reviewed today: Cardiac Studies & Procedures     STRESS TESTS  MYOCARDIAL PERFUSION IMAGING 07/24/2021  Narrative   Findings are consistent with no ischemia and no prior myocardial infarction. The study is low risk.   No ST deviation was noted.  Left ventricular function is normal. Nuclear stress EF: 59 %. The left ventricular ejection fraction is normal (55-65%). End diastolic cavity size is normal.   Prior study not available for comparison.   Diaphragmatic attenuation noted.   ECHOCARDIOGRAM  ECHOCARDIOGRAM COMPLETE 10/25/2022    IMPRESSIONS   1. Left ventricular ejection fraction, by estimation, is 60 to 65%. The left ventricle has normal function. The left ventricle has no regional wall motion abnormalities. Left ventricular diastolic parameters are consistent with Grade I diastolic dysfunction (impaired relaxation). The average left ventricular global longitudinal strain is -16.4 %. The global longitudinal strain is abnormal. 2. Right ventricular systolic function is normal. The right ventricular size is normal. There is normal pulmonary artery systolic pressure. 3. The mitral valve is normal in structure.  No evidence of mitral valve regurgitation. No evidence of mitral stenosis. 4. The aortic valve is tricuspid. Aortic valve regurgitation is not visualized. No aortic stenosis is present. 5. Aortic Normal DTA. 6. The inferior vena cava is normal in size with greater than 50% respiratory variability, suggesting right atrial pressure of 3 mmHg.  FINDINGS Left Ventricle: Left ventricular ejection fraction, by estimation, is 60 to 65%. The left ventricle has normal function. The left ventricle has no regional wall motion abnormalities. The average left ventricular global longitudinal strain is -16.4 %. The global longitudinal strain is abnormal. The left ventricular internal cavity size was normal in size. There is no left ventricular hypertrophy. Left ventricular diastolic parameters are consistent with Grade I diastolic dysfunction (impaired relaxation). Normal left ventricular filling pressure.  Right Ventricle: The right ventricular size is normal. No increase in right ventricular wall thickness. Right ventricular systolic function is normal. There is normal pulmonary artery systolic pressure. The tricuspid regurgitant velocity is 2.50 m/s, and with an assumed right atrial pressure of 3 mmHg, the estimated right ventricular systolic pressure is 28.0 mmHg.  Left Atrium: Left atrial size was normal in size.  Right Atrium: Right atrial size was normal in size.  Pericardium: There is no evidence of pericardial effusion.  Mitral Valve: The mitral valve is normal in structure. No evidence of mitral valve regurgitation. No evidence of mitral valve stenosis.  Tricuspid Valve: The tricuspid valve is normal in structure. Tricuspid valve regurgitation is mild . No evidence of tricuspid stenosis.  Aortic Valve: The aortic valve is tricuspid. Aortic valve regurgitation is not visualized. No aortic stenosis is present.  Pulmonic Valve: The pulmonic valve was normal in structure. Pulmonic valve regurgitation  is mild. No evidence of pulmonic stenosis.  Aorta: Normal DTA, the aortic arch was not well visualized and the aortic root and ascending aorta are structurally normal, with no evidence of dilitation.  Venous: A normal flow pattern is recorded from the right upper pulmonary vein. The inferior vena cava is normal in size with greater than 50% respiratory variability, suggesting right atrial pressure of 3 mmHg.  IAS/Shunts: No atrial level shunt detected by color flow Doppler.       CT SCANS  CT CORONARY MORPH W/CTA COR W/SCORE 10/22/2022  Addendum 10/22/2022  3:49 PM ADDENDUM REPORT: 10/22/2022 15:46  EXAM: OVER-READ INTERPRETATION  CT CHEST  The following report is an over-read performed by radiologist Dr. Curly Shores [facility] on 10/22/2022. This over-read does not include interpretation of cardiac or coronary anatomy or pathology. The coronary CTA interpretation by the cardiologist is attached.  COMPARISON:  None.  FINDINGS: Cardiovascular: No incidental pulmonary artery filling defects to suggest PE.  Mediastinum/Nodes: No suspicious adenopathy  identified. Imaged mediastinal structures are unremarkable.  Lungs/Pleura: Dependent basilar subsegmental atelectasis. Lungs are otherwise clear. No pleural effusion or pneumothorax.  Upper Abdomen: No acute abnormality.  Musculoskeletal: No chest wall abnormality. No acute or significant osseous findings.  IMPRESSION: No significant extracardiac incidental findings identified.   Electronically Signed By: Layla Maw M.D. On: 10/22/2022 15:46  Narrative CLINICAL DATA:  This is a 42 year old male with anginal symptoms.  EXAM: Cardiac/Coronary  CTA  TECHNIQUE: The patient was scanned on a [employer].  FINDINGS: A 100 kV prospective scan was triggered in the descending thoracic aorta at 111 HU's. Axial non-contrast 3 mm slices were carried out through the heart. The data set was  analyzed on a dedicated work station and scored using the Agatson method. Gantry rotation speed was 250 msecs and collimation was .6 mm. No beta blockade and 0.8 mg of sl NTG was given. The 3D data set was reconstructed in 5% intervals of the 67-82 % of the R-R cycle. Diastolic phases were analyzed on a dedicated work station using MPR, MIP and VRT modes. The patient received 80 cc of contrast.  Aorta: Normal size.  No calcifications.  No dissection.  Aortic Valve:  Trileaflet.  No calcifications.  Coronary Arteries:  Normal coronary origin.  Right dominance.  RCA is a large dominant artery that gives rise to PDA and PLA. There is no plaque.  Left main is a large artery that gives rise to LAD and LCX arteries.  LAD is a large vessel that has no plaque.  LCX is a non-dominant artery that gives rise to one large OM1 branch. There is no plaque.  Coronary Calcium Score:  Left main: 0  Left anterior descending artery: 0  Left circumflex artery: 0  Right coronary artery: 0  Total: 0  Percentile: 0  Other findings:  Normal pulmonary vein drainage into the left atrium.  Normal left atrial appendage without a thrombus.  Normal size of the pulmonary artery.  IMPRESSION: 1. Coronary calcium score of 0. This was 0 percentile for age and sex matched control.  2. Normal coronary origin with right dominance.  3. CAD-RADS 0. No evidence of CAD (0%). Consider non-atherosclerotic causes of chest pain.  The noncardiac portion of this study will be interpreted in separate report by the radiologist.  Electronically Signed: By: Thomasene Ripple D.O. On: 10/19/2022 16:58          Echo [facility] 05/27/21  LVEF 55-60%, no rwma Normal diastolic filling, trace MR Normal RV  Recent Labs: 03/16/2022: Magnesium 1.8 05/25/2022: B Natriuretic Peptide 27.4 06/07/2022: ALT 24; BUN 20; Creat 1.06; Hemoglobin 12.4; Platelets 334; Potassium 4.2; Sodium 137  Recent Lipid Panel No  results found for: "CHOL", "TRIG", "HDL", "CHOLHDL", "VLDL", "LDLCALC", "LDLDIRECT"   Risk Assessment/Calculations:       Physical Exam:    VS:  There were no vitals taken for this visit.    Wt Readings from Last 3 Encounters:  10/11/22 189 lb 6.4 oz (85.9 kg)  06/07/22 189 lb (85.7 kg)  06/03/22 194 lb 14.2 oz (88.4 kg)     GEN:  Well nourished, well developed in no acute distress HEENT: Normal NECK: No JVD; No carotid bruits CARDIAC: RRR, no murmurs, rubs, gallops RESPIRATORY:  Clear to auscultation without rales, wheezing or rhonchi  ABDOMEN: Soft, non-tender, non-distended MUSCULOSKELETAL:  No edema; No deformity. 2+ pedal pulses, equal bilaterally SKIN: Warm and dry NEUROLOGIC:  Alert and oriented x 3 PSYCHIATRIC:  Anxious    EKG:  EKG is ***  No BP recorded.  {Refresh Note OR Click here to enter BP  :1}***   Diagnoses:    No diagnosis found.  Assessment and Plan:     Chest pain: Frequent episodes of chest pain that intensify with stress/activity at times. Previous episode of "feeling like a [employer] was on my chest." Risk factors include HTN, former tobacco abuse, family history. We will get coronary CTA for mapping of coronary anatomy. Will have him take Lopressor 100 mg 2 hours prior.   Shortness of breath: Reports SOB that has been persistent for some time. He does not described increasing DOE, however he admits that his activity is somewhat limited by his foot ulcer. Reports bilateral LE edema at the end of each work day. History of endocarditis and MRSA infections. Last echo 05/2021 with trace MR and TR. We will update echo for evaluation of structural heart disease.   Hypertension: BP is elevated today. He does not monitor on a consistent basis. We will add valsartan 40 mg daily. Advised him to monitor BP on a consistent basis and report back if BP consistently > 130/80. Will check BMP in 1-2 weeks.   Endocarditis: History of IVDU, MRSA. Had endocarditis ~  10 years ago. No murmur on exam. Will update echocardiogram for evaluation of heart valves.      Disposition: ***  Medication Adjustments/Labs and Tests Ordered: Current medicines are reviewed at length with the patient today.  Concerns regarding medicines are outlined above.  No orders of the defined types were placed in this encounter.  No orders of the defined types were placed in this encounter.   There are no Patient Instructions on file for this visit.   Signed, Levi Aland, NP  12/21/2022 4:44 PM    [facility]

## 2022-12-23 ENCOUNTER — Ambulatory Visit: Payer: BC Managed Care – PPO | Attending: Nurse Practitioner | Admitting: Nurse Practitioner

## 2022-12-24 ENCOUNTER — Encounter: Payer: Self-pay | Admitting: Nurse Practitioner

## 2023-01-20 DIAGNOSIS — M86171 Other acute osteomyelitis, right ankle and foot: Secondary | ICD-10-CM | POA: Diagnosis not present

## 2023-01-20 DIAGNOSIS — F419 Anxiety disorder, unspecified: Secondary | ICD-10-CM | POA: Diagnosis not present

## 2023-01-20 DIAGNOSIS — M8660 Other chronic osteomyelitis, unspecified site: Secondary | ICD-10-CM | POA: Diagnosis not present

## 2023-01-20 DIAGNOSIS — F901 Attention-deficit hyperactivity disorder, predominantly hyperactive type: Secondary | ICD-10-CM | POA: Diagnosis not present

## 2023-01-20 DIAGNOSIS — I10 Essential (primary) hypertension: Secondary | ICD-10-CM | POA: Diagnosis not present

## 2023-01-27 DIAGNOSIS — M8660 Other chronic osteomyelitis, unspecified site: Secondary | ICD-10-CM | POA: Diagnosis not present

## 2023-01-28 DIAGNOSIS — A4902 Methicillin resistant Staphylococcus aureus infection, unspecified site: Secondary | ICD-10-CM | POA: Diagnosis not present

## 2023-01-28 DIAGNOSIS — M869 Osteomyelitis, unspecified: Secondary | ICD-10-CM | POA: Diagnosis not present

## 2023-01-28 DIAGNOSIS — T8130XA Disruption of wound, unspecified, initial encounter: Secondary | ICD-10-CM | POA: Diagnosis not present

## 2023-01-28 DIAGNOSIS — L237 Allergic contact dermatitis due to plants, except food: Secondary | ICD-10-CM | POA: Diagnosis not present

## 2023-02-17 DIAGNOSIS — Z79899 Other long term (current) drug therapy: Secondary | ICD-10-CM | POA: Diagnosis not present

## 2023-02-17 DIAGNOSIS — E876 Hypokalemia: Secondary | ICD-10-CM | POA: Diagnosis not present

## 2023-02-17 DIAGNOSIS — Z5181 Encounter for therapeutic drug level monitoring: Secondary | ICD-10-CM | POA: Diagnosis not present

## 2023-02-17 DIAGNOSIS — I10 Essential (primary) hypertension: Secondary | ICD-10-CM | POA: Diagnosis not present

## 2023-03-08 DIAGNOSIS — Z5181 Encounter for therapeutic drug level monitoring: Secondary | ICD-10-CM | POA: Diagnosis not present

## 2023-03-08 DIAGNOSIS — Z79899 Other long term (current) drug therapy: Secondary | ICD-10-CM | POA: Diagnosis not present

## 2023-03-17 DIAGNOSIS — F901 Attention-deficit hyperactivity disorder, predominantly hyperactive type: Secondary | ICD-10-CM | POA: Diagnosis not present

## 2023-03-17 DIAGNOSIS — J449 Chronic obstructive pulmonary disease, unspecified: Secondary | ICD-10-CM | POA: Diagnosis not present

## 2023-03-17 DIAGNOSIS — Z5181 Encounter for therapeutic drug level monitoring: Secondary | ICD-10-CM | POA: Diagnosis not present

## 2023-03-17 DIAGNOSIS — Z13228 Encounter for screening for other metabolic disorders: Secondary | ICD-10-CM | POA: Diagnosis not present

## 2023-03-17 DIAGNOSIS — Z79899 Other long term (current) drug therapy: Secondary | ICD-10-CM | POA: Diagnosis not present

## 2023-06-01 ENCOUNTER — Ambulatory Visit: Payer: BC Managed Care – PPO | Admitting: Podiatry

## 2023-06-08 ENCOUNTER — Ambulatory Visit (INDEPENDENT_AMBULATORY_CARE_PROVIDER_SITE_OTHER): Payer: BC Managed Care – PPO | Admitting: Podiatry

## 2023-06-08 DIAGNOSIS — Z91199 Patient's noncompliance with other medical treatment and regimen due to unspecified reason: Secondary | ICD-10-CM

## 2023-06-08 NOTE — Progress Notes (Signed)
No show

## 2023-06-14 DIAGNOSIS — M869 Osteomyelitis, unspecified: Secondary | ICD-10-CM | POA: Diagnosis not present

## 2023-06-14 DIAGNOSIS — A4902 Methicillin resistant Staphylococcus aureus infection, unspecified site: Secondary | ICD-10-CM | POA: Diagnosis not present

## 2023-06-14 DIAGNOSIS — L0390 Cellulitis, unspecified: Secondary | ICD-10-CM | POA: Diagnosis not present

## 2023-08-23 ENCOUNTER — Ambulatory Visit: Payer: BC Managed Care – PPO | Admitting: Family Medicine

## 2023-08-23 ENCOUNTER — Encounter: Payer: Self-pay | Admitting: Family Medicine

## 2023-08-23 VITALS — BP 138/88 | HR 52 | Temp 97.6°F | Ht 73.0 in | Wt 199.0 lb

## 2023-08-23 DIAGNOSIS — B009 Herpesviral infection, unspecified: Secondary | ICD-10-CM | POA: Diagnosis not present

## 2023-08-23 DIAGNOSIS — R739 Hyperglycemia, unspecified: Secondary | ICD-10-CM

## 2023-08-23 DIAGNOSIS — F902 Attention-deficit hyperactivity disorder, combined type: Secondary | ICD-10-CM

## 2023-08-23 DIAGNOSIS — R5382 Chronic fatigue, unspecified: Secondary | ICD-10-CM

## 2023-08-23 DIAGNOSIS — M86271 Subacute osteomyelitis, right ankle and foot: Secondary | ICD-10-CM

## 2023-08-23 DIAGNOSIS — J45909 Unspecified asthma, uncomplicated: Secondary | ICD-10-CM

## 2023-08-23 DIAGNOSIS — I10 Essential (primary) hypertension: Secondary | ICD-10-CM

## 2023-08-23 DIAGNOSIS — J302 Other seasonal allergic rhinitis: Secondary | ICD-10-CM

## 2023-08-23 HISTORY — DX: Other seasonal allergic rhinitis: J30.2

## 2023-08-23 HISTORY — DX: Attention-deficit hyperactivity disorder, combined type: F90.2

## 2023-08-23 HISTORY — DX: Hyperglycemia, unspecified: R73.9

## 2023-08-23 HISTORY — DX: Essential (primary) hypertension: I10

## 2023-08-23 HISTORY — DX: Chronic fatigue, unspecified: R53.82

## 2023-08-23 MED ORDER — ALBUTEROL SULFATE HFA 108 (90 BASE) MCG/ACT IN AERS: 2.0000 | INHALATION_SPRAY | RESPIRATORY_TRACT | 1 refills | Status: DC | PRN

## 2023-08-23 MED ORDER — AMPHETAMINE-DEXTROAMPHETAMINE 30 MG PO TABS: 30.0000 mg | ORAL_TABLET | Freq: Two times a day (BID) | ORAL | 0 refills | Status: DC

## 2023-08-23 MED ORDER — FLOVENT HFA 110 MCG/ACT IN AERO: 2.0000 | INHALATION_SPRAY | Freq: Two times a day (BID) | RESPIRATORY_TRACT | 1 refills | Status: DC

## 2023-08-23 MED ORDER — MONTELUKAST SODIUM 10 MG PO TABS: 10.0000 mg | ORAL_TABLET | Freq: Every day | ORAL | 3 refills | Status: DC

## 2023-08-23 MED ORDER — NEBIVOLOL HCL 10 MG PO TABS: 10.0000 mg | ORAL_TABLET | Freq: Every day | ORAL | 2 refills | Status: DC

## 2023-08-23 MED ORDER — VALACYCLOVIR HCL 500 MG PO TABS: 500.0000 mg | ORAL_TABLET | Freq: Every day | ORAL | 2 refills | Status: AC

## 2023-08-23 MED ORDER — FLUTICASONE FUROATE-VILANTEROL 200-25 MCG/ACT IN AEPB: 1.0000 | INHALATION_SPRAY | Freq: Every day | RESPIRATORY_TRACT | 1 refills | Status: DC

## 2023-08-23 NOTE — Assessment & Plan Note (Signed)
Reports genital herpes. -Continue Valtrex daily for suppression.

## 2023-08-23 NOTE — Assessment & Plan Note (Signed)
Reports feeling sick after eating sweets. -Order A1c and glucose levels.

## 2023-08-23 NOTE — Assessment & Plan Note (Signed)
 Labs drawn, Await labs/testing for assessment and recommendations

## 2023-08-23 NOTE — Assessment & Plan Note (Signed)
Reports Adderall sometimes causes fatigue. -Continue Adderall. -Plan to reassess at next visit in one month.

## 2023-08-23 NOTE — Assessment & Plan Note (Signed)
Recurrent osteomyelitis with multiple hospitalizations and surgeries. Last treated with IV antibiotics. 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 -Order labs to monitor for infection.

## 2023-08-23 NOTE — Assessment & Plan Note (Signed)
Daily use of albuterol and Breo. Reports fatigue and body aches. -Refill albuterol and Breo prescriptions. -Refer to pulmonology for further evaluation and management.

## 2023-08-23 NOTE — Progress Notes (Signed)
New Patient Office Visit  Subjective    Patient ID: George Shelton, male    DOB: [birth year]  Age: 43 y.o. MRN: [id number]  CC:  Chief Complaint  Patient presents with   Establish Care         HPI The patient, a new patient to the office, presents with concerns about his liver and kidney function based on previous blood work. He reports a complex medical history including osteomyelitis, hypertension, asthma, ADHD, and a history of HSV. He has been hospitalized multiple times for osteomyelitis, resulting in the loss of his toes and multiple surgeries. He reports recurrent infections despite being on antibiotics.  The patient also reports nocturnal itching, which he believes may be related to his liver function. He has been experiencing this for more than six months and describes it as severe, causing rashes all over his body.  He also reports fatigue, which he has been experiencing for more than six months. He describes feeling tired a lot and having decreased energy.  The patient also expresses concerns about potential diabetes. He reports feeling sick to his stomach and experiencing vision changes after eating sweets. He also reports that his glucose levels have been high in the past, but he has not been formally diagnosed with diabetes.  The patient is also seeking refills for his medications, including Adderall for ADHD, which he reports sometimes makes him feel sleepy. He also takes Bystolic for hypertension, Valtrex for HSV, and several inhalers for asthma. He reports using his albuterol inhaler a couple of times a day and also uses Customer service manager.  The patient is also seeking referrals to cardiology and pulmonology. He has a history of hypertension and has experienced chest pain in the past. He also has asthma and reports nodules on his lungs that need follow-up.   Outpatient Encounter Medications as of 08/23/2023  Medication Sig   montelukast (SINGULAIR) 10 MG tablet Take 1  tablet (10 mg total) by mouth at bedtime.   [DISCONTINUED] albuterol (VENTOLIN HFA) 108 (90 Base) MCG/ACT inhaler Inhale 2 puffs into the lungs every 4 (four) hours as needed for wheezing or shortness of breath.   [DISCONTINUED] amphetamine-dextroamphetamine (ADDERALL) 30 MG tablet Take 30 mg by mouth 2 (two) times daily.   [DISCONTINUED] doxycycline (VIBRA-TABS) 100 MG tablet Take 100 mg by mouth 2 (two) times daily.   [DISCONTINUED] FLOVENT HFA 110 MCG/ACT inhaler Inhale 2 puffs into the lungs 2 (two) times daily.   [DISCONTINUED] fluticasone furoate-vilanterol (BREO ELLIPTA) 200-25 MCG/ACT AEPB Inhale 1 puff into the lungs daily.   [DISCONTINUED] montelukast (SINGULAIR) 10 MG tablet Take 10 mg by mouth at bedtime.   [DISCONTINUED] mupirocin ointment (BACTROBAN) 2 % Apply 1 Application topically daily.   [DISCONTINUED] valACYclovir (VALTREX) 500 MG tablet Take 500 mg by mouth daily.   [DISCONTINUED] valsartan (DIOVAN) 40 MG tablet Take 1 tablet (40 mg total) by mouth daily.   albuterol (VENTOLIN HFA) 108 (90 Base) MCG/ACT inhaler Inhale 2 puffs into the lungs every 4 (four) hours as needed for wheezing or shortness of breath.   amphetamine-dextroamphetamine (ADDERALL) 30 MG tablet Take 1 tablet by mouth 2 (two) times daily.   FLOVENT HFA 110 MCG/ACT inhaler Inhale 2 puffs into the lungs 2 (two) times daily.   fluticasone furoate-vilanterol (BREO ELLIPTA) 200-25 MCG/ACT AEPB Inhale 1 puff into the lungs daily.   nebivolol (BYSTOLIC) 10 MG tablet Take 1 tablet (10 mg total) by mouth daily.   valACYclovir (VALTREX) 500 MG tablet Take 1  tablet (500 mg total) by mouth daily.   [DISCONTINUED] gabapentin (NEURONTIN) 600 MG tablet Take 600 mg by mouth 3 (three) times daily.   [DISCONTINUED] metoprolol tartrate (LOPRESSOR) 100 MG tablet Take one (1 ) tablet by mouth ( 100 mg) 2 hours prior to CT scan.   [DISCONTINUED] nebivolol (BYSTOLIC) 10 MG tablet Take 1 tablet (10 mg total) by mouth daily.    [DISCONTINUED] pantoprazole (PROTONIX) 40 MG tablet Take 1 tablet (40 mg total) by mouth daily.   No facility-administered encounter medications on file as of 08/23/2023.    Past Medical History:  Diagnosis Date   Abscess 04/20/2022   Angina at rest [facility])    Cellulitis of right foot 03/15/2022   Crush injury to foot, right, initial encounter 03/15/2022   HSV-1 (herpes simplex virus 1) infection    lip ulcer per Pt   HSV-2 (herpes simplex virus 2) infection    Infection of right wrist (HCC) 07/12/2021   Left atrial enlargement    Medication monitoring encounter 12/30/2021   MRSA (methicillin resistant staph aureus) culture positive    Nicotine dependence    quit July 6,2023   PICC (peripherally inserted central catheter) in place 12/30/2021   Polysubstance abuse (HCC)    Sepsis (HCC)    Sleep apnea    Does not use CPAP (But is supposed to)   Wrist joint infection (HCC) 07/14/2021    Past Surgical History:  Procedure Laterality Date   INCISION AND DRAINAGE ABSCESS Right 05/27/2022   Procedure: INCISION AND DRAINAGE ABSCESS;  Surgeon: Pilar Plate, DPM;  Location: [facility];  Service: Podiatry;  Laterality: Right;   IRRIGATION AND DEBRIDEMENT ABSCESS Right 04/20/2022   Procedure: IRRIGATION AND DEBRIDEMENT groinABSCESS;  Surgeon: Axel Filler, MD;  Location: [facility];  Service: General;  Laterality: Right;   TOE AMPUTATION     Big Toe   TRANSMETATARSAL AMPUTATION Right 03/16/2022   Procedure: TRANSMETATARSAL AMPUTATION/I&D RIGHT FOOT/BONE BIOPSY;  Surgeon: Vivi Barrack, DPM;  Location: [facility];  Service: Podiatry;  Laterality: Right;  patient would like a block    Family History  Problem Relation Age of Onset   Heart attack Father    Hypertension Father    Diabetes Father    Melanoma Father    Sleep apnea Father    Hypertension Maternal Grandmother    Atrial fibrillation Maternal Grandmother    Hypertension Maternal Grandfather    Pancreatic cancer Paternal  Grandmother     Social History   Socioeconomic History   Marital status: Married    Spouse name: George Shelton   Number of children: 2   Years of education: Not on file   Highest education level: Not on file  Occupational History   Not on file  Tobacco Use   Smoking status: Former    Current packs/day: 0.00    Types: Cigarettes    Quit date: 11/2021    Years since quitting: 1.7   Smokeless tobacco: Former    Types: Engineer, drilling   Vaping status: Never Used  Substance and Sexual Activity   Alcohol use: Not Currently    Comment: occasionally   Drug use: Not Currently   Sexual activity: Yes    Partners: Female  Other Topics Concern   Not on file  Social History Narrative   Not on file   Social Drivers of Health   Financial Resource Strain: High Risk (10/01/2022)   [facility]   Overall Financial Resource  Strain (CARDIA)    Difficulty of Paying Living Expenses: Hard  Food Insecurity: No Food Insecurity (08/23/2023)   Hunger Vital Sign    Worried About Running Out of Food in the Last Year: Never true    Ran Out of Food in the Last Year: Never true  Transportation Needs: No Transportation Needs (10/01/2022)   [facility] from [facility]   PRAPARE - Transportation    Lack of Transportation (Medical): No    Lack of Transportation (Non-Medical): No  Physical Activity: Inactive (08/23/2023)   Exercise Vital Sign    Days of Exercise per Week: 0 days    Minutes of Exercise per Session: 0 min  Stress: No Stress Concern Present (08/23/2023)   [employer] of Occupational Health - Occupational Stress Questionnaire    Feeling of Stress : Not at all  Social Connections: Moderately Isolated (08/23/2023)   Social Connection and Isolation Panel [NHANES]    Frequency of Communication with Friends and Family: More than three times a week    Frequency of Social Gatherings with Friends and Family: More than three times a week     Attends Religious Services: Never    Database administrator or Organizations: No    Attends Banker Meetings: Never    Marital Status: Married  Catering manager Violence: Not At Risk (08/23/2023)   Humiliation, Afraid, Rape, and Kick questionnaire    Fear of Current or Ex-Partner: No    Emotionally Abused: No    Physically Abused: No    Sexually Abused: No    Review of Systems  Constitutional:  Negative for chills, fever and malaise/fatigue.  HENT:  Positive for congestion, sinus pain and sore throat. Negative for ear pain.   Respiratory:  Negative for cough and shortness of breath.   Cardiovascular:  Negative for chest pain.  Musculoskeletal:  Negative for myalgias.  Neurological:  Negative for headaches.        Objective    BP 138/88   Pulse (!) 52   Temp 97.6 F (36.4 C)   Ht 6\' 1"  (1.854 m)   Wt 199 lb (90.3 kg)   SpO2 95%   BMI 26.25 kg/m   Physical Exam Vitals reviewed.  Constitutional:      General: He is not in acute distress.    Appearance: Normal appearance. He is not ill-appearing.  Eyes:     Conjunctiva/sclera: Conjunctivae normal.  Neck:     Vascular: No carotid bruit.  Cardiovascular:     Rate and Rhythm: Normal rate and regular rhythm.     Heart sounds: Normal heart sounds. No murmur heard. Pulmonary:     Effort: Pulmonary effort is normal.     Breath sounds: Normal breath sounds. No wheezing.  Musculoskeletal:        General: Normal range of motion.     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  Skin:    General: Skin is warm.  Neurological:     Mental Status: He is alert. Mental status is at baseline.  Psychiatric:        Attention and Perception: He is inattentive.        Mood and Affect: Mood is anxious.        Speech: Speech is rapid and pressured.        Behavior: Behavior is hyperactive.       Assessment & Plan:  Primary hypertension Assessment &  Plan: Blood pressure elevated at 138/88. Reports occasional headaches and vision changes when blood pressure is high. -Continue Bystolic 10mg  daily. -Check blood pressure at next visit in one month.  Orders: -     Ambulatory referral to Cardiology -     Nebivolol HCl; Take 1 tablet (10 mg total) by mouth daily.  Dispense: 30 tablet; Refill: 2 -     Comprehensive metabolic panel -     CBC with Differential/Platelet -     Lipid panel  Subacute osteomyelitis of right foot (HCC) Assessment & Plan: Recurrent osteomyelitis with multiple hospitalizations and surgeries. Last treated with IV antibiotics. 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 -Order labs to monitor for infection.   Chronic asthma without complication, unspecified asthma severity, unspecified whether persistent Assessment & Plan: Daily use of albuterol and Breo. Reports fatigue and body aches. -Refill albuterol and Breo prescriptions. -Refer to pulmonology for further evaluation and management.   Orders: -     Ambulatory referral to Pulmonology -     Albuterol Sulfate HFA; Inhale 2 puffs into the lungs every 4 (four) hours as needed for wheezing or shortness of breath.  Dispense: 8 g; Refill: 1 -     Fluticasone Furoate-Vilanterol; Inhale 1 puff into the lungs daily.  Dispense: 30 each; Refill: 1 -     Flovent HFA; Inhale 2 puffs into the lungs 2 (two) times daily.  Dispense: 1 each; Refill: 1  HSV-1 (herpes simplex virus 1) infection Assessment & Plan: Reports genital herpes. -Continue Valtrex daily for suppression.  Orders: -     valACYclovir HCl; Take 1 tablet (500 mg total) by mouth daily.  Dispense: 30 tablet; Refill: 2  Seasonal allergies Assessment & Plan: Refill needed  -patient reports itching, not sure if it is lever - labs drawn, Await labs/testing for assessment and recommendations   Orders: -     Montelukast Sodium; Take 1 tablet (10 mg total) by mouth at bedtime.  Dispense: 30 tablet; Refill:  3  Attention deficit hyperactivity disorder (ADHD), combined type Assessment & Plan: Reports Adderall sometimes causes fatigue. -Continue Adderall. -Plan to reassess at next visit in one month.  Orders: -     Amphetamine-Dextroamphetamine; Take 1 tablet by mouth 2 (two) times daily.  Dispense: 60 tablet; Refill: 0  Elevated serum glucose Assessment & Plan: Reports feeling sick after eating sweets. -Order A1c and glucose levels.  Orders: -     Hemoglobin A1c  Chronic fatigue Assessment & Plan: Labs drawn, Await labs/testing for assessment and recommendations   Orders: -     TSH -     VITAMIN D 25 Hydroxy (Vit-D Deficiency, Fractures)    Follow-up:Return in about 4 weeks (around 09/20/2023) for BP recheck, med check, Annual Physical.   Total time spent on today's visit was 48 minutes, including both face-to-face time and nonface-to-face time personally spent on review of chart (labs and imaging), discussing labs and goals, discussing further work-up, treatment options, referrals to specialist if needed, reviewing outside records if pertinent, answering patient's questions, and coordinating care.    Lajuana Matte, FNP [facility] [phone]

## 2023-08-23 NOTE — Assessment & Plan Note (Signed)
Refill needed  -patient reports itching, not sure if it is lever - labs drawn, Await labs/testing for assessment and recommendations

## 2023-08-23 NOTE — Assessment & Plan Note (Signed)
Blood pressure elevated at 138/88. Reports occasional headaches and vision changes when blood pressure is high. -Continue Bystolic 10mg  daily. -Check blood pressure at next visit in one month.

## 2023-08-24 ENCOUNTER — Other Ambulatory Visit: Payer: Self-pay | Admitting: Family Medicine

## 2023-08-24 DIAGNOSIS — E559 Vitamin D deficiency, unspecified: Secondary | ICD-10-CM

## 2023-08-24 LAB — COMPREHENSIVE METABOLIC PANEL
ALT: 27 [IU]/L (ref 0–44)
AST: 23 [IU]/L (ref 0–40)
Albumin: 4.2 g/dL (ref 4.1–5.1)
Alkaline Phosphatase: 84 [IU]/L (ref 44–121)
BUN/Creatinine Ratio: 12 (ref 9–20)
BUN: 12 mg/dL (ref 6–24)
Bilirubin Total: 0.7 mg/dL (ref 0.0–1.2)
CO2: 26 mmol/L (ref 20–29)
Calcium: 9.4 mg/dL (ref 8.7–10.2)
Chloride: 101 mmol/L (ref 96–106)
Creatinine, Ser: 1.03 mg/dL (ref 0.76–1.27)
Globulin, Total: 2 g/dL (ref 1.5–4.5)
Glucose: 93 mg/dL (ref 70–99)
Potassium: 4 mmol/L (ref 3.5–5.2)
Sodium: 140 mmol/L (ref 134–144)
Total Protein: 6.2 g/dL (ref 6.0–8.5)
eGFR: 92 mL/min/{1.73_m2} (ref >59)

## 2023-08-24 LAB — CBC WITH DIFFERENTIAL/PLATELET
Basophils Absolute: 0 10*3/uL (ref 0.0–0.2)
Basos: 1 %
EOS (ABSOLUTE): 0.2 10*3/uL (ref 0.0–0.4)
Eos: 5 %
Hematocrit: 43.4 % (ref 37.5–51.0)
Hemoglobin: 14.4 g/dL (ref 13.0–17.7)
Immature Grans (Abs): 0 10*3/uL (ref 0.0–0.1)
Immature Granulocytes: 0 %
Lymphocytes Absolute: 1 10*3/uL (ref 0.7–3.1)
Lymphs: 19 %
MCH: 30.1 pg (ref 26.6–33.0)
MCHC: 33.2 g/dL (ref 31.5–35.7)
MCV: 91 fL (ref 79–97)
Monocytes Absolute: 0.5 10*3/uL (ref 0.1–0.9)
Monocytes: 10 %
Neutrophils Absolute: 3.3 10*3/uL (ref 1.4–7.0)
Neutrophils: 65 %
Platelets: 175 10*3/uL (ref 150–450)
RBC: 4.79 x10E6/uL (ref 4.14–5.80)
RDW: 11.9 % (ref 11.6–15.4)
WBC: 5 10*3/uL (ref 3.4–10.8)

## 2023-08-24 LAB — LIPID PANEL
Chol/HDL Ratio: 2.5 {ratio} (ref 0.0–5.0)
Cholesterol, Total: 125 mg/dL (ref 100–199)
HDL: 51 mg/dL (ref >39)
LDL Chol Calc (NIH): 60 mg/dL (ref 0–99)
Triglycerides: 65 mg/dL (ref 0–149)
VLDL Cholesterol Cal: 14 mg/dL (ref 5–40)

## 2023-08-24 LAB — HEMOGLOBIN A1C
Est. average glucose Bld gHb Est-mCnc: 105 mg/dL
Hgb A1c MFr Bld: 5.3 % (ref 4.8–5.6)

## 2023-08-24 LAB — VITAMIN D 25 HYDROXY (VIT D DEFICIENCY, FRACTURES): Vit D, 25-Hydroxy: 23.9 ng/mL — ABNORMAL LOW (ref 30.0–100.0)

## 2023-08-24 LAB — TSH: TSH: 0.854 u[IU]/mL (ref 0.450–4.500)

## 2023-08-24 MED ORDER — VITAMIN D (ERGOCALCIFEROL) 1.25 MG (50000 UNIT) PO CAPS: 50000.0000 [IU] | ORAL_CAPSULE | ORAL | 0 refills | Status: AC

## 2023-08-25 ENCOUNTER — Other Ambulatory Visit: Payer: Self-pay

## 2023-08-29 ENCOUNTER — Ambulatory Visit: Payer: Self-pay | Admitting: Family Medicine

## 2023-08-29 NOTE — Telephone Encounter (Signed)
Spoke with Pharmacy tech who stated patient has picked up rx.

## 2023-08-29 NOTE — Telephone Encounter (Signed)
Copied from CRM [id number]. Topic: Clinical - Prescription Issue >> Aug 29, 2023 10:27 AM Dennison Nancy wrote: Reason for CRM: Caryn Bee calling from [facility] in Regarding the medication  FLOVENT HFA 110 MCG/ACT inhaler the brand name no longer available , do have the generic , received a fax back that can refill the Albuterol   Needing more information regarding the Albuterol  Please call [facility] [phone]

## 2023-08-30 ENCOUNTER — Ambulatory Visit: Payer: BC Managed Care – PPO | Attending: Cardiology | Admitting: Cardiology

## 2023-08-30 ENCOUNTER — Encounter: Payer: Self-pay | Admitting: Cardiology

## 2023-08-30 VITALS — BP 122/84 | HR 108 | Ht 73.0 in | Wt 195.0 lb

## 2023-08-30 DIAGNOSIS — G4730 Sleep apnea, unspecified: Secondary | ICD-10-CM | POA: Insufficient documentation

## 2023-08-30 DIAGNOSIS — R072 Precordial pain: Secondary | ICD-10-CM

## 2023-08-30 DIAGNOSIS — I10 Essential (primary) hypertension: Secondary | ICD-10-CM | POA: Diagnosis not present

## 2023-08-30 DIAGNOSIS — R000 Tachycardia, unspecified: Secondary | ICD-10-CM

## 2023-08-30 MED ORDER — METOPROLOL TARTRATE 100 MG PO TABS: 100.0000 mg | ORAL_TABLET | Freq: Once | ORAL | 0 refills | Status: DC

## 2023-08-30 NOTE — Patient Instructions (Signed)
Medication Instructions:  Your physician recommends that you continue on your current medications as directed. Please refer to the Current Medication list given to you today.  *If you need a refill on your cardiac medications before your next appointment, please call your pharmacy*   Lab Work: Your physician recommends that you return for lab work in:   Labs today: BMP  If you have labs (blood work) drawn today and your tests are completely normal, you will receive your results only by: MyChart Message (if you have MyChart) OR A paper copy in the mail If you have any lab test that is abnormal or we need to change your treatment, we will call you to review the results.   Testing/Procedures:   Your cardiac CT will be scheduled at one of the below locations:   [facility] [address] [place], [place] [address] [phone]  OR  [facility] [address] [place], [place] [address] [phone]  OR   [facility] [address] [place], [place] [address] [phone]  OR   [facility] [address] [place], [place] [address] [phone]  If scheduled at [facility], please arrive at the [facility] and Children's Entrance (Entrance C2) of [facility] 30 minutes prior to test start time. You can use the FREE valet parking offered at entrance C (encouraged to control the heart rate for the test)  Proceed to the [facility] Radiology Department (first floor) to check-in and test prep.  All radiology patients and guests should use entrance C2 at [facility], accessed from [facility], even though the hospital's physical address listed is [address].    If scheduled at [facility] or [facility], please arrive 15 mins early for check-in and test prep.  There is spacious parking  and easy access to the radiology department from the [facility] Heart and Vascular entrance. Please enter here and check-in with the desk attendant.   Please follow these instructions carefully (unless otherwise directed):  An IV will be required for this test and Nitroglycerin will be given.  Hold all erectile dysfunction medications at least 3 days (72 hrs) prior to test. (Ie viagra, cialis, sildenafil, tadalafil, etc)   On the Night Before the Test: Be sure to Drink plenty of water. Do not consume any caffeinated/decaffeinated beverages or chocolate 12 hours prior to your test. Do not take any antihistamines 12 hours prior to your test.  On the Day of the Test: Drink plenty of water until 1 hour prior to the test. Do not eat any food 1 hour prior to test. You may take your regular medications prior to the test.  Take metoprolol (Lopressor) two hours prior to test.      After the Test: Drink plenty of water. After receiving IV contrast, you may experience a mild flushed feeling. This is normal. On occasion, you may experience a mild rash up to 24 hours after the test. This is not dangerous. If this occurs, you can take Benadryl 25 mg and increase your fluid intake. If you experience trouble breathing, this can be serious. If it is severe call 911 IMMEDIATELY. If it is mild, please call our office.  We will call to schedule your test 2-4 weeks out understanding that some insurance companies will need an authorization prior to the service being performed.   For more information and  frequently asked questions, please visit our website : [email or web address]  For non-scheduling related questions, please contact the cardiac imaging nurse navigator should you have any questions/concerns: Cardiac Imaging Nurse Navigators Direct Office Dial: [phone]   For scheduling needs, including cancellations and rescheduling, please call [place], [phone].    Follow-Up: At [facility], you and your health needs are our priority.  As part of our continuing mission to provide you with exceptional heart care, we have created designated Provider Care Teams.  These Care Teams include your primary Cardiologist (physician) and Advanced Practice Providers (APPs -  Physician Assistants and Nurse Practitioners) who all work together to provide you with the care you need, when you need it.  We recommend signing up for the patient portal called "MyChart".  Sign up information is provided on this After Visit Summary.  MyChart is used to connect with patients for Virtual Visits (Telemedicine).  Patients are able to view lab/test results, encounter notes, upcoming appointments, etc.  Non-urgent messages can be sent to your provider as well.   To learn more about what you can do with MyChart, go to [email or web address].    Your next appointment:   1 year(s)  Provider:   Norman Herrlich, MD    Other Instructions None

## 2023-08-30 NOTE — Progress Notes (Signed)
Cardiology Office Note:    Date:  08/30/2023   ID:  George [facility]ton, DOB 1980/10/12, MRN [id number]  PCP:  Renne Crigler, FNP  Cardiologist:  Norman Herrlich, MD    Referring MD: Renne Crigler, FNP    ASSESSMENT:    1. Precordial pain   2. Primary hypertension   3. Sinus tachycardia    PLAN:    In order of problems listed above:  Continues to have chronic chest pain this may be more costochondral but certainly needs further evaluation previous normal myocardial perfusion study cardiac CTA is appropriate and be scheduled Well-controlled continue diastolic Likely related to chronic Adderall therapy   Next appointment: Plan to see him in yearly follow-up, he had an echocardiogram March 2000 valvular dysfunction or heart failure   Medication Adjustments/Labs and Tests Ordered: Current medicines are reviewed at length with the patient today.  Concerns regarding medicines are outlined above.  Orders Placed This Encounter  Procedures   EKG 12-Lead   No orders of the defined types were placed in this encounter.    History of Present Illness:    George Shelton is a 43 y.o. male with a hx of chest pain with normal echo cardiogram and myocardial perfusion study previously last seen by me in March 2023.  He saw Clair Gulling March 2024 the focus that visit was his history of having recurrent MRSA bacteremia and endocarditis and was seen for clearance to undergo hyperbaric chamber therapy for osteomyelitis of his foot.  He saw his PCP in the office he was referred to pulmonary for evaluation and management of asthma and there is a notation that the patient requested to see cardiology.  Compliance with diet, lifestyle and medications: Yes  Establishing primary care told him that he continues to have episodes of chest pain Is a chronic chest pain pattern nonexertional located along the left costochondral joint episodes usually 5 to 10 minutes resolve spontaneously but he tells  me he had an episode about a year ago that lasted 45 minutes and ED evaluation. He is not having exertional angina his chest pain is more sharp than pressure and does not radiate to the neck jaw or shoulders. Episodes occur 1 or 2 times a month He does heavy physical labor with grading. He has noticed some edema in the right foot where he had osteomyelitis and surgery No orthopnea palpitation or syncope He has asthma uses bronchodilators intermittently short of breath is nodules and was referred to pulmonary Past Medical History:  Diagnosis Date   Abscess 04/20/2022   Abscess of right groin 04/20/2022   Angina at rest [facility])    Asthma, chronic 05/25/2022   Attention deficit hyperactivity disorder (ADHD), combined type 08/23/2023   Cellulitis 05/25/2022   Cellulitis of right foot 03/15/2022   Chronic fatigue 08/23/2023   Chronic osteomyelitis involving ankle and foot, right (HCC) 06/07/2022   Cough with hemoptysis 05/25/2022   Crush injury to foot, right, initial encounter 03/15/2022   Elevated serum glucose 08/23/2023   Hepatitis C antibody positive in blood 12/15/2021   HSV-1 (herpes simplex virus 1) infection    lip ulcer per Pt   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   Infection of right wrist (HCC) 07/12/2021   Left atrial enlargement    Lymphangitis 05/25/2022   Medication monitoring encounter 12/30/2021   MRSA (methicillin resistant staph aureus) culture positive    Neuropathic pain 05/25/2022   Nicotine dependence    quit  July 6,2023   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   amphetamine-dextroamphetamine (ADDERALL) 30 MG tablet Take 1 tablet by mouth 2 (two) times daily.   FLOVENT HFA 110 MCG/ACT inhaler Inhale 2 puffs into the lungs 2 (two) times daily.   fluticasone furoate-vilanterol (BREO ELLIPTA) 200-25 MCG/ACT AEPB Inhale 1 puff into the lungs daily.   montelukast (SINGULAIR) 10 MG tablet Take 1 tablet (10 mg total) by mouth at bedtime.   nebivolol (BYSTOLIC) 10 MG tablet Take 1 tablet (10 mg total) by mouth daily.   valACYclovir (VALTREX) 500 MG tablet Take 1 tablet (500 mg total) by mouth daily.   Vitamin D, Ergocalciferol, (DRISDOL) 1.25 MG (50000 UNIT) CAPS capsule Take 1 capsule (50,000 Units total) by mouth every 7 (seven) days for 12 doses.      EKGs/Labs/Other Studies Reviewed:    The following studies were reviewed today:  Cardiac Studies & Procedures     STRESS TESTS  MYOCARDIAL PERFUSION IMAGING 07/23/2021  Narrative   Findings are consistent with no ischemia and no prior myocardial infarction. The study is low risk.   No ST deviation was noted.   Left ventricular function is normal. Nuclear stress EF: 59 %. The left ventricular ejection fraction is normal (55-65%). End diastolic cavity size is normal.   Prior study not available for comparison.   Diaphragmatic attenuation noted.  ECHOCARDIOGRAM  ECHOCARDIOGRAM COMPLETE 10/25/2022  Narrative ECHOCARDIOGRAM REPORT    Patient Name:   George Shelton Date of Exam: 10/25/2022 Medical Rec #:  [id number]         Height:       73.0 in Accession #:    [id number]        Weight:       189.4 lb Date of Birth:  September 18, 1980         BSA:          2.102 m Patient Age:    42 years          BP:           122/73 mmHg Patient  Gender: M                 HR:           87 bpm. Exam Location:  [facility]  Procedure: 2D Echo, Cardiac Doppler, Color Doppler and Strain Analysis  Indications:    Precordial pain [R07.2 (ICD-10-CM)]; History of endocarditis [Z86.79 (ICD-10-CM)]  History:        Patient has prior history of Echocardiogram examinations, most recent 05/29/2021. Signs/Symptoms:Cellulitis.  Sonographer:    Louie Boston RDCS Referring Phys: [id number] Zachary George SWINYER  IMPRESSIONS   1. Left ventricular ejection fraction, by estimation, is 60 to 65%. The left ventricle has normal function. The left ventricle has no regional wall motion abnormalities. Left ventricular diastolic  parameters are consistent with Grade I diastolic dysfunction (impaired relaxation). The average left ventricular global longitudinal strain is -16.4 %. The global longitudinal strain is abnormal. 2. Right ventricular systolic function is normal. The right ventricular size is normal. There is normal pulmonary artery systolic pressure. 3. The mitral valve is normal in structure. No evidence of mitral valve regurgitation. No evidence of mitral stenosis. 4. The aortic valve is tricuspid. Aortic valve regurgitation is not visualized. No aortic stenosis is present. 5. Aortic Normal DTA. 6. The inferior vena cava is normal in size with greater than 50% respiratory variability, suggesting right atrial pressure of 3 mmHg.  FINDINGS Left Ventricle: Left ventricular ejection fraction, by estimation, is 60 to 65%. The left ventricle has normal function. The left ventricle has no regional wall motion abnormalities. The average left ventricular global longitudinal strain is -16.4 %. The global longitudinal strain is abnormal. The left ventricular internal cavity size was normal in size. There is no left ventricular hypertrophy. Left ventricular diastolic parameters are consistent with Grade I diastolic dysfunction (impaired relaxation). Normal left ventricular  filling pressure.  Right Ventricle: The right ventricular size is normal. No increase in right ventricular wall thickness. Right ventricular systolic function is normal. There is normal pulmonary artery systolic pressure. The tricuspid regurgitant velocity is 2.50 m/s, and with an assumed right atrial pressure of 3 mmHg, the estimated right ventricular systolic pressure is 28.0 mmHg.  Left Atrium: Left atrial size was normal in size.  Right Atrium: Right atrial size was normal in size.  Pericardium: There is no evidence of pericardial effusion.  Mitral Valve: The mitral valve is normal in structure. No evidence of mitral valve regurgitation. No evidence of mitral valve stenosis.  Tricuspid Valve: The tricuspid valve is normal in structure. Tricuspid valve regurgitation is mild . No evidence of tricuspid stenosis.  Aortic Valve: The aortic valve is tricuspid. Aortic valve regurgitation is not visualized. No aortic stenosis is present.  Pulmonic Valve: The pulmonic valve was normal in structure. Pulmonic valve regurgitation is mild. No evidence of pulmonic stenosis.  Aorta: Normal DTA, the aortic arch was not well visualized and the aortic root and ascending aorta are structurally normal, with no evidence of dilitation.  Venous: A normal flow pattern is recorded from the right upper pulmonary vein. The inferior vena cava is normal in size with greater than 50% respiratory variability, suggesting right atrial pressure of 3 mmHg.  IAS/Shunts: No atrial level shunt detected by color flow Doppler.   LEFT VENTRICLE PLAX 2D LVIDd:         5.00 cm   Diastology LVIDs:         3.30 cm   LV e' medial:    11.70 cm/s LV PW:         0.80 cm   LV E/e' medial:  6.9 LV IVS:        0.80 cm   LV e' lateral:   17.00 cm/s LVOT diam:     2.00 cm   LV E/e' lateral: 4.7 LV SV:         68 LV SV Index:   32        2D Longitudinal Strain LVOT Area:     3.14 cm  2D Strain GLS Avg:     -16.4 %   RIGHT  VENTRICLE             IVC RV Basal diam:  2.70 cm     IVC diam: 1.60 cm RV S  prime:     14.80 cm/s TAPSE (M-mode): 2.8 cm  LEFT ATRIUM             Index        RIGHT ATRIUM           Index LA diam:        3.00 cm 1.43 cm/m   RA Area:     14.10 cm LA Vol (A2C):   44.3 ml 21.07 ml/m  RA Volume:   33.00 ml  15.70 ml/m LA Vol (A4C):   35.2 ml 16.74 ml/m LA Biplane Vol: 39.6 ml 18.84 ml/m AORTIC VALVE LVOT Vmax:   124.00 cm/s LVOT Vmean:  82.800 cm/s LVOT VTI:    0.217 m  AORTA Ao Root diam: 3.00 cm Ao Asc diam:  3.40 cm Ao Desc diam: 2.40 cm  MV E velocity: 80.45 cm/s  TRICUSPID VALVE MV A velocity: 95.90 cm/s  TR Peak grad:   25.0 mmHg MV E/A ratio:  0.84        TR Vmax:        250.00 cm/s  SHUNTS Systemic VTI:  0.22 m Systemic Diam: 2.00 cm  Norman Herrlich MD Electronically signed by Norman Herrlich MD Signature Date/Time: 10/25/2022/5:19:54 PM    Final    CT SCANS  CT CORONARY MORPH W/CTA COR W/SCORE 10/19/2022  Addendum 10/22/2022  3:49 PM ADDENDUM REPORT: 10/22/2022 15:46  EXAM: OVER-READ INTERPRETATION  CT CHEST  The following report is an over-read performed by radiologist Dr. Curly [facility] on 10/22/2022. This over-read does not include interpretation of cardiac or coronary anatomy or pathology. The coronary CTA interpretation by the cardiologist is attached.  COMPARISON:  None.  FINDINGS: Cardiovascular: No incidental pulmonary artery filling defects to suggest PE.  Mediastinum/Nodes: No suspicious adenopathy identified. Imaged mediastinal structures are unremarkable.  Lungs/Pleura: Dependent basilar subsegmental atelectasis. Lungs are otherwise clear. No pleural effusion or pneumothorax.  Upper Abdomen: No acute abnormality.  Musculoskeletal: No chest wall abnormality. No acute or significant osseous findings.  IMPRESSION: No significant extracardiac incidental findings identified.   Electronically Signed By:  Layla Maw M.D. On: 10/22/2022 15:46  Narrative CLINICAL DATA:  This is a 43 year old male with anginal symptoms.  EXAM: Cardiac/Coronary  CTA  TECHNIQUE: The patient was scanned on a [employer].  FINDINGS: A 100 kV prospective scan was triggered in the descending thoracic aorta at 111 HU's. Axial non-contrast 3 mm slices were carried out through the heart. The data set was analyzed on a dedicated work station and scored using the Agatson method. Gantry rotation speed was 250 msecs and collimation was .6 mm. No beta blockade and 0.8 mg of sl NTG was given. The 3D data set was reconstructed in 5% intervals of the 67-82 % of the R-R cycle. Diastolic phases were analyzed on a dedicated work station using MPR, MIP and VRT modes. The patient received 80 cc of contrast.  Aorta: Normal size.  No calcifications.  No dissection.  Aortic Valve:  Trileaflet.  No calcifications.  Coronary Arteries:  Normal coronary origin.  Right dominance.  RCA is a large dominant artery that gives rise to PDA and PLA. There is no plaque.  Left main is a large artery that gives rise to LAD and LCX arteries.  LAD is a large vessel that has no plaque.  LCX is a non-dominant artery that gives rise to one large OM1 branch. There is no plaque.  Coronary Calcium Score:  Left main:  0  Left anterior descending artery: 0  Left circumflex artery: 0  Right coronary artery: 0  Total: 0  Percentile: 0  Other findings:  Normal pulmonary vein drainage into the left atrium.  Normal left atrial appendage without a thrombus.  Normal size of the pulmonary artery.  IMPRESSION: 1. Coronary calcium score of 0. This was 0 percentile for age and sex matched control.  2. Normal coronary origin with right dominance.  3. CAD-RADS 0. No evidence of CAD (0%). Consider non-atherosclerotic causes of chest pain.  The noncardiac portion of this study will be interpreted in separate report  by the radiologist.  Electronically Signed: By: Thomasene Ripple D.O. On: 10/19/2022 16:58          EKG Interpretation Date/Time:  Tuesday August 30 2023 11:03:27 EST Ventricular Rate:  108 PR Interval:  142 QRS Duration:  82 QT Interval:  312 QTC Calculation: 418 R Axis:   83  Text Interpretation: Sinus tachycardia Nonspecific T wave abnormality When compared with ECG of 03-Jun-2022 14:28, New T wave abnormality Confirmed by Norman Herrlich ([id number]) on 08/30/2023 11:22:02 AM   Recent Labs: 08/23/2023: ALT 27; BUN 12; Creatinine, Ser 1.03; Hemoglobin 14.4; Platelets 175; Potassium 4.0; Sodium 140; TSH 0.854  Recent Lipid Panel    Component Value Date/Time   CHOL 125 08/23/2023 1515   TRIG 65 08/23/2023 1515   HDL 51 08/23/2023 1515   CHOLHDL 2.5 08/23/2023 1515   LDLCALC 60 08/23/2023 1515    Physical Exam:    VS:  BP 122/84   Pulse (!) 108   Ht 6\' 1"  (1.854 m)   Wt 195 lb (88.5 kg)   SpO2 95%   BMI 25.73 kg/m     Wt Readings from Last 3 Encounters:  08/30/23 195 lb (88.5 kg)  08/23/23 199 lb (90.3 kg)  10/11/22 189 lb 6.4 oz (85.9 kg)     GEN:  Well nourished, well developed in no acute distress HEENT: Normal NECK: No JVD; No carotid bruits LYMPHATICS: No lymphadenopathy CARDIAC: RRR, no murmurs, rubs, gallops RESPIRATORY:  Clear to auscultation without rales, wheezing or rhonchi  ABDOMEN: Soft, non-tender, non-distended MUSCULOSKELETAL:  No edema; No deformity  SKIN: Warm and dry NEUROLOGIC:  Alert and oriented x 3 PSYCHIATRIC:  Normal affect    Signed, Norman Herrlich, MD  08/30/2023 11:30 AM    [facility]

## 2023-08-31 ENCOUNTER — Other Ambulatory Visit: Payer: Self-pay | Admitting: Family Medicine

## 2023-08-31 DIAGNOSIS — J45909 Unspecified asthma, uncomplicated: Secondary | ICD-10-CM

## 2023-08-31 LAB — BASIC METABOLIC PANEL
BUN/Creatinine Ratio: 12 (ref 9–20)
BUN: 15 mg/dL (ref 6–24)
CO2: 23 mmol/L (ref 20–29)
Calcium: 9.1 mg/dL (ref 8.7–10.2)
Chloride: 103 mmol/L (ref 96–106)
Creatinine, Ser: 1.24 mg/dL (ref 0.76–1.27)
Glucose: 106 mg/dL — ABNORMAL HIGH (ref 70–99)
Potassium: 4.3 mmol/L (ref 3.5–5.2)
Sodium: 142 mmol/L (ref 134–144)
eGFR: 74 mL/min/{1.73_m2} (ref >59)

## 2023-08-31 MED ORDER — ALBUTEROL-BUDESONIDE 90-80 MCG/ACT IN AERO: 2.0000 | INHALATION_SPRAY | RESPIRATORY_TRACT | 0 refills | Status: DC | PRN

## 2023-09-12 ENCOUNTER — Telehealth (HOSPITAL_COMMUNITY): Payer: Self-pay | Admitting: *Deleted

## 2023-09-12 NOTE — Telephone Encounter (Signed)
 Reaching out to patient to offer assistance regarding upcoming cardiac imaging study; pt verbalizes understanding of appt date/time, parking situation and where to check in, pre-test NPO status and medications ordered, and verified current allergies; name and call back number provided for further questions should they arise  [name] RN Navigator Cardiac Imaging [facility] [phone] office [phone] cell  Patient confirms HR is over 100 bpm and is to take 100mg  metoprolol  tartrate two hours prior to his cardiac CT scan. He is aware to arrive at 3 PM.

## 2023-09-13 ENCOUNTER — Ambulatory Visit (HOSPITAL_COMMUNITY)
Admission: RE | Admit: 2023-09-13 | Discharge: 2023-09-13 | Disposition: A | Discharge status: Home / Self Care | Payer: BC Managed Care – PPO | Source: Ambulatory Visit | Attending: Cardiology | Admitting: Cardiology

## 2023-09-13 ENCOUNTER — Other Ambulatory Visit: Payer: Self-pay | Admitting: Family Medicine

## 2023-09-13 DIAGNOSIS — J45909 Unspecified asthma, uncomplicated: Secondary | ICD-10-CM

## 2023-09-13 DIAGNOSIS — R072 Precordial pain: Secondary | ICD-10-CM | POA: Insufficient documentation

## 2023-09-13 MED ORDER — NITROGLYCERIN 0.4 MG SL SUBL
SUBLINGUAL_TABLET | SUBLINGUAL | Status: AC
  Filled 2023-09-13: qty 2

## 2023-09-13 MED ORDER — METOPROLOL TARTRATE 5 MG/5ML IV SOLN
10.0000 mg | INTRAVENOUS | Status: DC | PRN
  Administered 2023-09-13: 10 mg via INTRAVENOUS

## 2023-09-13 MED ORDER — IOHEXOL 350 MG/ML SOLN
95.0000 mL | Freq: Once | INTRAVENOUS | Status: AC | PRN
  Administered 2023-09-13: 95 mL via INTRAVENOUS

## 2023-09-13 MED ORDER — NITROGLYCERIN 0.4 MG SL SUBL
0.8000 mg | SUBLINGUAL_TABLET | Freq: Once | SUBLINGUAL | Status: AC
  Administered 2023-09-13: 0.8 mg via SUBLINGUAL

## 2023-09-13 MED ORDER — METOPROLOL TARTRATE 5 MG/5ML IV SOLN
INTRAVENOUS | Status: AC
  Filled 2023-09-13: qty 20

## 2023-09-20 ENCOUNTER — Encounter: Payer: Self-pay | Admitting: Family Medicine

## 2023-09-20 ENCOUNTER — Ambulatory Visit (INDEPENDENT_AMBULATORY_CARE_PROVIDER_SITE_OTHER): Payer: BC Managed Care – PPO | Admitting: Family Medicine

## 2023-09-20 VITALS — BP 118/72 | HR 83 | Temp 97.8°F | Ht 73.0 in | Wt 198.0 lb

## 2023-09-20 DIAGNOSIS — I10 Essential (primary) hypertension: Secondary | ICD-10-CM | POA: Diagnosis not present

## 2023-09-20 DIAGNOSIS — R55 Syncope and collapse: Secondary | ICD-10-CM | POA: Diagnosis not present

## 2023-09-20 DIAGNOSIS — E559 Vitamin D deficiency, unspecified: Secondary | ICD-10-CM | POA: Insufficient documentation

## 2023-09-20 DIAGNOSIS — A084 Viral intestinal infection, unspecified: Secondary | ICD-10-CM | POA: Insufficient documentation

## 2023-09-20 DIAGNOSIS — F902 Attention-deficit hyperactivity disorder, combined type: Secondary | ICD-10-CM | POA: Diagnosis not present

## 2023-09-20 MED ORDER — AMPHETAMINE-DEXTROAMPHETAMINE 30 MG PO TABS: 30.0000 mg | ORAL_TABLET | Freq: Two times a day (BID) | ORAL | 0 refills | Status: DC

## 2023-09-20 NOTE — Assessment & Plan Note (Signed)
 Currently managed on Adderall. -Continue Adderall 30 mg -Pharmacy to send refill request on 20th February 2025.

## 2023-09-20 NOTE — Assessment & Plan Note (Signed)
 Episode of syncope on Saturday, associated with severe gastroenteritis. Hit right side of head during fall. No current headache or other neurological symptoms. -No further action required at this time.

## 2023-09-20 NOTE — Assessment & Plan Note (Signed)
 Recent episode of severe cramping, vomiting, and diarrhea. Resolved prior to the time of visit. No current nausea, vomiting, or diarrhea. -No further action required at this time.

## 2023-09-20 NOTE — Progress Notes (Signed)
 Subjective:  Patient ID: George Shelton, male    DOB: 1980-09-18  Age: 43 y.o. MRN: [id number]  Chief Complaint  Patient presents with   Hypertension   ADHD   HPI: George Shelton is a 43 year old male who presents with recent gastrointestinal symptoms and syncope.  He has been experiencing gastrointestinal symptoms, including vomiting and diarrhea, which began on Friday night. These symptoms were accompanied by severe cramping in his legs, described as intense and unlike any previous episodes, lasting approximately twelve hours. He is unsure if he had a fever during this time. The gastrointestinal symptoms have since improved.  He experienced syncope while attempting to reach the bathroom, resulting in a fall and hitting the right side of his head. This occurred during the acute phase of his gastrointestinal symptoms. No current sinus issues, sore throat, or headache, but he confirms the syncope and fall on Saturday. He is unsure about having a fever during the recent illness.  He is currently taking Adderall and requires a refill. He has been consistent with his medication, which he feels has improved his overall well-being. He also mentions a past prescription for vitamin D, which he did not fill due to cost.  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     09/20/2023    2:32 PM 04/01/2022   10:24 AM 12/15/2021    9:22 AM  Depression screen PHQ 2/9  Decreased Interest 0 0 0  Down, Depressed, Hopeless 0 0 1  PHQ - 2 Score 0 0 1        09/20/2023    2:32 PM  Fall Risk   Falls in the past year? 0  Number falls in past yr: 0  Injury with Fall? 0  Risk for fall due to : No Fall Risks  Follow up Falls evaluation completed    Patient Care Team: Renne Crigler, FNP as PCP - General (Family Medicine)   Review of Systems  Constitutional:  Negative for appetite change, fatigue and fever.  HENT:   Negative for congestion, ear pain, sinus pressure and sore throat.   Respiratory:  Negative for cough, chest tightness, shortness of breath and wheezing.   Cardiovascular:  Negative for chest pain and palpitations.  Gastrointestinal:  Positive for diarrhea (started Saturday). Negative for abdominal pain, constipation, nausea and vomiting.  Genitourinary:  Negative for dysuria and hematuria.  Musculoskeletal:  Negative for arthralgias, back pain, joint swelling and myalgias.  Skin:  Negative for rash.  Neurological:  Positive for syncope (Saturday - hit right side of his head). Negative for dizziness, weakness and headaches.  Psychiatric/Behavioral:  Negative for dysphoric mood. The patient is not nervous/anxious.     Current Outpatient Medications on File Prior to Visit  Medication Sig Dispense Refill   albuterol (VENTOLIN HFA) 108 (90 Base) MCG/ACT inhaler NEED ALBUTEROL DIRECTIONS 18 g 1   Albuterol-Budesonide 90-80 MCG/ACT AERO Inhale 2 puffs into the lungs every 4 (four) hours as needed. 5.9 g 0   FLOVENT HFA 110 MCG/ACT inhaler Inhale 2 puffs into the lungs 2 (two) times daily. 1 each 1   fluticasone furoate-vilanterol (BREO ELLIPTA) 200-25 MCG/ACT AEPB Inhale 1 puff into the lungs daily. 30 each 1   montelukast (SINGULAIR) 10 MG tablet Take 1 tablet (10 mg total) by mouth at bedtime. 30 tablet 3   nebivolol (BYSTOLIC) 10 MG tablet Take 1 tablet (10 mg total) by mouth daily.  30 tablet 2   valACYclovir (VALTREX) 500 MG tablet Take 1 tablet (500 mg total) by mouth daily. 30 tablet 2   Vitamin D, Ergocalciferol, (DRISDOL) 1.25 MG (50000 UNIT) CAPS capsule Take 1 capsule (50,000 Units total) by mouth every 7 (seven) days for 12 doses. 12 capsule 0   metoprolol tartrate (LOPRESSOR) 100 MG tablet Take 1 tablet (100 mg total) by mouth once for 1 dose. Please take this medication 2 hours before CT 1 tablet 0   No current facility-administered medications on file prior to visit.   Past Medical  History:  Diagnosis Date   Abscess 04/20/2022   Abscess of right groin 04/20/2022   Angina at rest [facility])    Asthma, chronic 05/25/2022   Attention deficit hyperactivity disorder (ADHD), combined type 08/23/2023   Cellulitis 05/25/2022   Cellulitis of right foot 03/15/2022   Chronic fatigue 08/23/2023   Chronic osteomyelitis involving ankle and foot, right (HCC) 06/07/2022   Cough with hemoptysis 05/25/2022   Crush injury to foot, right, initial encounter 03/15/2022   Elevated serum glucose 08/23/2023   Hepatitis C antibody positive in blood 12/15/2021   HSV-1 (herpes simplex virus 1) infection    lip ulcer per Pt   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   Infection of right wrist (HCC) 07/12/2021   Left atrial enlargement    Lymphangitis 05/25/2022   Medication monitoring encounter 12/30/2021   MRSA (methicillin resistant staph aureus) culture positive    Neuropathic pain 05/25/2022   Nicotine dependence    quit July 6,2023   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) 10/01/2022   Osteomyelitis (HCC) 06/09/2022   PICC (peripherally inserted central catheter) in place 12/30/2021   Polysubstance abuse (HCC)    Primary hypertension 08/23/2023   S/P transmetatarsal amputation of foot, right (HCC) 10/01/2022   Seasonal allergies 08/23/2023   Sepsis (HCC)    Sleep apnea    Does not use CPAP (But is supposed to)   Wrist joint infection (HCC) 07/14/2021   Past Surgical History:  Procedure Laterality Date   INCISION AND DRAINAGE ABSCESS Right 05/27/2022   Procedure: INCISION AND DRAINAGE ABSCESS;  Surgeon: Pilar Plate, DPM;  Location: [facility];  Service: Podiatry;  Laterality: Right;   IRRIGATION AND DEBRIDEMENT ABSCESS Right 04/20/2022   Procedure: IRRIGATION AND DEBRIDEMENT groinABSCESS;  Surgeon: Axel Filler, MD;  Location: [facility];  Service: General;  Laterality: Right;   TOE AMPUTATION      Big Toe   TRANSMETATARSAL AMPUTATION Right 03/16/2022   Procedure: TRANSMETATARSAL AMPUTATION/I&D RIGHT FOOT/BONE BIOPSY;  Surgeon: Vivi Barrack, DPM;  Location: [facility];  Service: Podiatry;  Laterality: Right;  patient would like a block    Family History  Problem Relation Age of Onset   Heart attack Father    Hypertension Father    Diabetes Father    Melanoma Father    Sleep apnea Father    Hypertension Maternal Grandmother    Atrial fibrillation Maternal Grandmother    Hypertension Maternal Grandfather    Pancreatic cancer Paternal Grandmother    Social History   Socioeconomic History   Marital status: Married    Spouse name: Trong Gosling   Number of children: 2   Years of education: Not on file   Highest education level: Not on file  Occupational History   Not on file  Tobacco Use   Smoking status: Former    Current  packs/day: 0.00    Types: Cigarettes    Quit date: 11/2021    Years since quitting: 1.8   Smokeless tobacco: Former    Types: Associate Professor status: Never Used  Substance and Sexual Activity   Alcohol use: Not Currently    Comment: occasionally   Drug use: Not Currently   Sexual activity: Yes    Partners: Female  Other Topics Concern   Not on file  Social History Narrative   Not on file   Social Drivers of Health   Financial Resource Strain: High Risk (10/01/2022)   Received from [facility], [facility]   Overall Financial Resource Strain (CARDIA)    Difficulty of Paying Living Expenses: Hard  Food Insecurity: No Food Insecurity (08/23/2023)   Hunger Vital Sign    Worried About Running Out of Food in the Last Year: Never true    Ran Out of Food in the Last Year: Never true  Transportation Needs: No Transportation Needs (10/01/2022)   Received from [employer], [facility]   PRAPARE - Transportation    Lack of Transportation (Medical): No    Lack of Transportation (Non-Medical): No  Physical Activity: Inactive  (08/23/2023)   Exercise Vital Sign    Days of Exercise per Week: 0 days    Minutes of Exercise per Session: 0 min  Stress: No Stress Concern Present (08/23/2023)   [employer] of Occupational Health - Occupational Stress Questionnaire    Feeling of Stress : Not at all  Social Connections: Moderately Isolated (08/23/2023)   Social Connection and Isolation Panel [NHANES]    Frequency of Communication with Friends and Family: More than three times a week    Frequency of Social Gatherings with Friends and Family: More than three times a week    Attends Religious Services: Never    Diplomatic Services operational officer: No    Attends Engineer, structural: Never    Marital Status: Married    Objective:  BP 118/72 (BP Location: Left Arm, Patient Position: Sitting)   Pulse 83   Temp 97.8 F (36.6 C) (Temporal)   Ht 6\' 1"  (1.854 m)   Wt 198 lb (89.8 kg)   SpO2 99%   BMI 26.12 kg/m      09/20/2023    2:30 PM 09/13/2023    4:10 PM 09/13/2023    3:45 PM  BP/Weight  Systolic BP 118 122 129  Diastolic BP 72 79 87  Wt. (Lbs) 198    BMI 26.12 kg/m2      Physical Exam Constitutional:      General: He is not in acute distress.    Appearance: Normal appearance. He is not ill-appearing.  Eyes:     Conjunctiva/sclera: Conjunctivae normal.  Cardiovascular:     Rate and Rhythm: Normal rate and regular rhythm.     Heart sounds: Normal heart sounds. No murmur heard. Pulmonary:     Effort: Pulmonary effort is normal.     Breath sounds: Normal breath sounds. No wheezing.  Abdominal:     General: Bowel sounds are normal.     Palpations: Abdomen is soft.     Tenderness: There is no abdominal tenderness.  Musculoskeletal:        General: Normal range of motion.     Cervical back: Normal range of motion.  Skin:    General: Skin is warm.  Neurological:     Mental Status: He is alert. Mental status is at  baseline.  Psychiatric:        Mood and Affect: Mood normal.         Behavior: Behavior normal.     Lab Results  Component Value Date   WBC 5.0 08/23/2023   HGB 14.4 08/23/2023   HCT 43.4 08/23/2023   PLT 175 08/23/2023   GLUCOSE 106 (H) 08/30/2023   CHOL 125 08/23/2023   TRIG 65 08/23/2023   HDL 51 08/23/2023   LDLCALC 60 08/23/2023   ALT 27 08/23/2023   AST 23 08/23/2023   NA 142 08/30/2023   K 4.3 08/30/2023   CL 103 08/30/2023   CREATININE 1.24 08/30/2023   BUN 15 08/30/2023   CO2 23 08/30/2023   TSH 0.854 08/23/2023   INR 0.9 05/25/2022   HGBA1C 5.3 08/23/2023      Assessment & Plan:    Primary hypertension Assessment & Plan: Blood pressure elevated at 138/88. Reports occasional headaches and vision changes when blood pressure is high. BP Readings from Last 3 Encounters:  09/20/23 118/72  09/13/23 122/79  08/30/23 122/84    -Continue Bystolic 10mg  daily.    Attention deficit hyperactivity disorder (ADHD), combined type Assessment & Plan: Currently managed on Adderall. -Continue Adderall 30 mg -Pharmacy to send refill request on 20th February 2025.   Orders: -     Amphetamine-Dextroamphetamine; Take 1 tablet by mouth 2 (two) times daily.  Dispense: 60 tablet; Refill: 0  Gastroenteritis and colitis, viral Assessment & Plan: Recent episode of severe cramping, vomiting, and diarrhea. Resolved prior to the time of visit. No current nausea, vomiting, or diarrhea. -No further action required at this time.   Syncope, unspecified syncope type Assessment & Plan: Episode of syncope on Saturday, associated with severe gastroenteritis. Hit right side of head during fall. No current headache or other neurological symptoms. -No further action required at this time.   Vitamin D deficiency Assessment & Plan: Recent labs showed low Vitamin D levels, 23.9. Patient did not fill prescription due to cost. -Recommend over-the-counter Vitamin D supplementation.      Meds ordered this encounter  Medications    amphetamine-dextroamphetamine (ADDERALL) 30 MG tablet    Sig: Take 1 tablet by mouth 2 (two) times daily.    Dispense:  60 tablet    Refill:  0    No orders of the defined types were placed in this encounter.    Follow-up: Return in about 2 months (around 11/18/2023) for med check.   An After Visit Summary was printed and given to the patient.  Total time spent on today's visit was 32 minutes, including both face-to-face time and nonface-to-face time personally spent on review of chart (labs and imaging), discussing labs and goals, discussing further work-up, treatment options, referrals to specialist if needed, reviewing outside records if pertinent, answering patient's questions, and coordinating care.    Lajuana Matte, FNP [facility] [phone]

## 2023-09-20 NOTE — Assessment & Plan Note (Addendum)
 Blood pressure elevated at 138/88. Reports occasional headaches and vision changes when blood pressure is high. BP Readings from Last 3 Encounters:  09/20/23 118/72  09/13/23 122/79  08/30/23 122/84    -Continue Bystolic 10mg  daily.

## 2023-09-20 NOTE — Assessment & Plan Note (Addendum)
 Recent labs showed low Vitamin D levels, 23.9. Patient did not fill prescription due to cost. -Recommend over-the-counter Vitamin D supplementation.

## 2023-11-01 ENCOUNTER — Telehealth: Payer: Self-pay

## 2023-11-01 NOTE — Telephone Encounter (Signed)
 Copied from CRM [id number]. Topic: Clinical - Prescription Issue >> Nov 01, 2023  8:58 AM Franchot Heidelberg wrote: Reason for CRM: Raven from [facility] called reporting that the patient needs a new Rx for his Adderall 30. Wants to know if he can receive 3 30 days scripts for 90 days total?   [facility] #[id number] Rosalita Levan, [place] - [address] AT [facility] [address] [place] [address] Phone: [phone] Fax: [phone]

## 2023-11-02 ENCOUNTER — Other Ambulatory Visit: Payer: Self-pay | Admitting: Family Medicine

## 2023-11-02 DIAGNOSIS — F902 Attention-deficit hyperactivity disorder, combined type: Secondary | ICD-10-CM

## 2023-11-02 NOTE — Telephone Encounter (Signed)
 Copied from CRM [id number]. Topic: Clinical - Medication Refill >> Nov 02, 2023  9:15 AM Fuller Mandril wrote: Most Recent Primary Care Visit:  Provider: Renne Crigler  Department: [facility]  Visit Type: OFFICE VISIT  Date: 09/20/2023  Medication: amphetamine-dextroamphetamine (ADDERALL) 30 MG tablet  Has the patient contacted their pharmacy? Yes (Agent: If no, request that the patient contact the pharmacy for the refill. If patient does not wish to contact the pharmacy document the reason why and proceed with request.) (Agent: If yes, when and what did the pharmacy advise?) requested has not received   Is this the correct pharmacy for this prescription? Yes If no, delete pharmacy and type the correct one.  This is the patient's preferred pharmacy:  [facility] #[id number] Rosalita Levan, [place] - [address] AT [facility] [address] [place] [address] Phone: [phone] Fax: [phone]   Has the prescription been filled recently? Yes  Is the patient out of the medication? Yes  Has the patient been seen for an appointment in the last year OR does the patient have an upcoming appointment? Yes  Can we respond through MyChart? Yes  Agent: Please be advised that Rx refills may take up to 3 business days. We ask that you follow-up with your pharmacy.

## 2023-11-03 MED ORDER — AMPHETAMINE-DEXTROAMPHETAMINE 30 MG PO TABS
30.0000 mg | ORAL_TABLET | Freq: Two times a day (BID) | ORAL | 0 refills | Status: DC
Start: 2023-11-03 — End: 2023-12-20

## 2023-11-17 ENCOUNTER — Ambulatory Visit: Payer: BC Managed Care – PPO | Admitting: Family Medicine

## 2023-12-08 ENCOUNTER — Ambulatory Visit (INDEPENDENT_AMBULATORY_CARE_PROVIDER_SITE_OTHER): Admitting: Family Medicine

## 2023-12-08 ENCOUNTER — Encounter: Payer: Self-pay | Admitting: Family Medicine

## 2023-12-08 VITALS — BP 138/62 | HR 89 | Temp 99.1°F | Resp 16 | Ht 73.0 in | Wt 190.2 lb

## 2023-12-08 DIAGNOSIS — E559 Vitamin D deficiency, unspecified: Secondary | ICD-10-CM | POA: Diagnosis not present

## 2023-12-08 DIAGNOSIS — I10 Essential (primary) hypertension: Secondary | ICD-10-CM

## 2023-12-08 DIAGNOSIS — Z91013 Allergy to seafood: Secondary | ICD-10-CM

## 2023-12-08 DIAGNOSIS — F902 Attention-deficit hyperactivity disorder, combined type: Secondary | ICD-10-CM

## 2023-12-08 DIAGNOSIS — I998 Other disorder of circulatory system: Secondary | ICD-10-CM

## 2023-12-08 DIAGNOSIS — R739 Hyperglycemia, unspecified: Secondary | ICD-10-CM

## 2023-12-08 DIAGNOSIS — Z1211 Encounter for screening for malignant neoplasm of colon: Secondary | ICD-10-CM

## 2023-12-08 MED ORDER — NEBIVOLOL HCL 10 MG PO TABS: 10.0000 mg | ORAL_TABLET | Freq: Every day | ORAL | 2 refills | Status: DC

## 2023-12-08 MED ORDER — EPINEPHRINE 0.3 MG/0.3ML IJ SOAJ: 0.3000 mg | INTRAMUSCULAR | 1 refills | Status: AC | PRN

## 2023-12-08 MED ORDER — LISDEXAMFETAMINE DIMESYLATE 50 MG PO CAPS: 50.0000 mg | ORAL_CAPSULE | Freq: Every day | ORAL | 0 refills | Status: DC

## 2023-12-08 NOTE — Progress Notes (Signed)
 Subjective:  Patient ID: George Shelton, male    DOB: Sep 11, 1980  Age: 43 y.o. MRN: [id number]  Chief Complaint  Patient presents with   Medical Management of Chronic Issues    HPI:  Hypertension: Currently taking Bystolic  10 mg by mouth once daily.  ADHD: Currently taking Adderall 30 mg by mouth once daily. Patient states that he feels sleepy and has even felt unsafe driving at times. Wants to switch to another medication that he liked better but had availability issues in the past.   Allergic Reaction: Recently had an allergic reaction to shellfish at a restaurant where his face started swelling and he had trouble breathing. He was able to treat it with OTC benadryl. He would like an Epi pen because he is worried about cross contamination at restaurants where shellfish is prepared.    Vitamin D  deficiency: Could not afford the Vitamin D  Rx, starting taking OTC supplementation. He is not sure of the dosage.   Ischemic vascular disease: Patient has a history of Ischemic vascular disease documented and has somewhat good BP control. Not currently on aspirin.   Allergies: taking Singulair  10 mg by mouth once daily  Asthma: Albuterol  1 puff Q 6 hrs PRN, Flovent  2 puffs BID and Breo Ellipta  - 1 puff daily. Patient states that he thought that since he stopped smoking that this would improve but he still requires breathing treatments.   Wellness: Colon cancer screening requested, uncle diagnosed with colon cancer at age 9.     09/20/2023    2:32 PM 04/01/2022   10:24 AM 12/15/2021    9:22 AM  Depression screen PHQ 2/9  Decreased Interest 0 0 0  Down, Depressed, Hopeless 0 0 1  PHQ - 2 Score 0 0 1        09/20/2023    2:32 PM  Fall Risk   Falls in the past year? 0  Number falls in past yr: 0  Injury with Fall? 0  Risk for fall due to : No Fall Risks  Follow up Falls evaluation completed    Patient Care Team: Janece Means, FNP as PCP - General (Family Medicine)   Review  of Systems  Constitutional:  Negative for appetite change, fatigue and fever.  HENT:  Negative for congestion, ear pain, sinus pressure and sore throat.   Eyes: Negative.   Respiratory:  Negative for cough, chest tightness, shortness of breath and wheezing.   Cardiovascular:  Negative for chest pain and palpitations.  Gastrointestinal:  Negative for abdominal pain, constipation, diarrhea, nausea and vomiting.  Endocrine: Negative.   Genitourinary:  Negative for dysuria and hematuria.  Musculoskeletal:  Negative for arthralgias, back pain, joint swelling and myalgias.  Skin:  Negative for rash.  Allergic/Immunologic: Negative.   Neurological:  Negative for dizziness, weakness and headaches.  Hematological: Negative.   Psychiatric/Behavioral:  Negative for dysphoric mood. The patient is not nervous/anxious.     Current Outpatient Medications on File Prior to Visit  Medication Sig Dispense Refill   albuterol  (VENTOLIN  HFA) 108 (90 Base) MCG/ACT inhaler NEED ALBUTEROL  DIRECTIONS 18 g 1   amphetamine -dextroamphetamine  (ADDERALL) 30 MG tablet Take 1 tablet by mouth 2 (two) times daily. 60 tablet 0   aspirin EC 81 MG tablet Take 81 mg by mouth daily. Swallow whole.     FLOVENT  HFA 110 MCG/ACT inhaler Inhale 2 puffs into the lungs 2 (two) times daily. 1 each 1   fluticasone  furoate-vilanterol (BREO ELLIPTA ) 200-25 MCG/ACT AEPB Inhale 1 puff  into the lungs daily. 30 each 1   montelukast  (SINGULAIR ) 10 MG tablet Take 1 tablet (10 mg total) by mouth at bedtime. 30 tablet 3   valACYclovir  (VALTREX ) 500 MG tablet Take 1 tablet (500 mg total) by mouth daily. 30 tablet 2   metoprolol  tartrate (LOPRESSOR ) 100 MG tablet Take 1 tablet (100 mg total) by mouth once for 1 dose. Please take this medication 2 hours before CT 1 tablet 0   No current facility-administered medications on file prior to visit.   Past Medical History:  Diagnosis Date   Abscess 04/20/2022   Abscess of right groin 04/20/2022    Angina at rest [facility])    Asthma, chronic 05/25/2022   Attention deficit hyperactivity disorder (ADHD), combined type 08/23/2023   Cellulitis 05/25/2022   Cellulitis of right foot 03/15/2022   Chronic fatigue 08/23/2023   Chronic osteomyelitis involving ankle and foot, right (HCC) 06/07/2022   Cough with hemoptysis 05/25/2022   Crush injury to foot, right, initial encounter 03/15/2022   Elevated serum glucose 08/23/2023   Hepatitis C antibody positive in blood 12/15/2021   HSV-1 (herpes simplex virus 1) infection    lip ulcer per Pt   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   Infection of right wrist (HCC) 07/12/2021   Left atrial enlargement    Lymphangitis 05/25/2022   Medication monitoring encounter 12/30/2021   MRSA (methicillin resistant staph aureus) culture positive    Neuropathic pain 05/25/2022   Nicotine dependence    quit July 6,2023   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) 10/01/2022   Osteomyelitis (HCC) 06/09/2022   PICC (peripherally inserted central catheter) in place 12/30/2021   Polysubstance abuse (HCC)    Primary hypertension 08/23/2023   S/P transmetatarsal amputation of foot, right (HCC) 10/01/2022   Seasonal allergies 08/23/2023   Sepsis (HCC)    Sleep apnea    Does not use CPAP (But is supposed to)   Wrist joint infection (HCC) 07/14/2021   Past Surgical History:  Procedure Laterality Date   INCISION AND DRAINAGE ABSCESS Right 05/27/2022   Procedure: INCISION AND DRAINAGE ABSCESS;  Surgeon: Evertt Hoe, DPM;  Location: [facility];  Service: Podiatry;  Laterality: Right;   IRRIGATION AND DEBRIDEMENT ABSCESS Right 04/20/2022   Procedure: IRRIGATION AND DEBRIDEMENT groinABSCESS;  Surgeon: Shela Derby, MD;  Location: [facility];  Service: General;  Laterality: Right;   TOE AMPUTATION     Big Toe   TRANSMETATARSAL AMPUTATION Right 03/16/2022   Procedure: TRANSMETATARSAL  AMPUTATION/I&D RIGHT FOOT/BONE BIOPSY;  Surgeon: Charity Conch, DPM;  Location: [facility];  Service: Podiatry;  Laterality: Right;  patient would like a block    Family History  Problem Relation Age of Onset   Heart attack Father    Hypertension Father    Diabetes Father    Melanoma Father    Sleep apnea Father    Hypertension Maternal Grandmother    Atrial fibrillation Maternal Grandmother    Hypertension Maternal Grandfather    Pancreatic cancer Paternal Grandmother    Social History   Socioeconomic History   Marital status: Married    Spouse name: Yasser Pund   Number of children: 2   Years of education: Not on file   Highest education level: Not on file  Occupational History   Not on file  Tobacco Use   Smoking status: Former    Current packs/day: 0.00    Types:  Cigarettes    Quit date: 11/2021    Years since quitting: 2.0   Smokeless tobacco: Former    Types: Associate Professor status: Never Used  Substance and Sexual Activity   Alcohol use: Not Currently    Comment: occasionally   Drug use: Not Currently   Sexual activity: Yes    Partners: Female  Other Topics Concern   Not on file  Social History Narrative   Not on file   Social Drivers of Health   Financial Resource Strain: High Risk (10/01/2022)   Received from [facility], [facility]   Overall Financial Resource Strain (CARDIA)    Difficulty of Paying Living Expenses: Hard  Food Insecurity: No Food Insecurity (08/23/2023)   Hunger Vital Sign    Worried About Running Out of Food in the Last Year: Never true    Ran Out of Food in the Last Year: Never true  Transportation Needs: No Transportation Needs (10/01/2022)   Received from [employer], [facility]   PRAPARE - Transportation    Lack of Transportation (Medical): No    Lack of Transportation (Non-Medical): No  Physical Activity: Inactive (08/23/2023)   Exercise Vital Sign    Days of Exercise per Week: 0 days    Minutes of  Exercise per Session: 0 min  Stress: No Stress Concern Present (08/23/2023)   [employer] of Occupational Health - Occupational Stress Questionnaire    Feeling of Stress : Not at all  Social Connections: Moderately Isolated (08/23/2023)   Social Connection and Isolation Panel [NHANES]    Frequency of Communication with Friends and Family: More than three times a week    Frequency of Social Gatherings with Friends and Family: More than three times a week    Attends Religious Services: Never    Database administrator or Organizations: No    Attends Engineer, structural: Never    Marital Status: Married    Objective:  BP 138/62   Pulse 89   Temp 99.1 F (37.3 C) (Temporal)   Resp 16   Ht 6\' 1"  (1.854 m)   Wt 190 lb 3.2 oz (86.3 kg)   SpO2 98%   BMI 25.09 kg/m      12/08/2023    1:20 PM 09/20/2023    2:30 PM 09/13/2023    4:10 PM  BP/Weight  Systolic BP 138 118 122  Diastolic BP 62 72 79  Wt. (Lbs) 190.2 198   BMI 25.09 kg/m2 26.12 kg/m2     Physical Exam Constitutional:      General: He is not in acute distress.    Appearance: Normal appearance. He is not ill-appearing.  Eyes:     Conjunctiva/sclera: Conjunctivae normal.  Cardiovascular:     Rate and Rhythm: Normal rate and regular rhythm.     Heart sounds: Normal heart sounds. No murmur heard. Pulmonary:     Effort: Pulmonary effort is normal.     Breath sounds: Normal breath sounds. No wheezing.  Skin:    General: Skin is warm.  Neurological:     Mental Status: He is alert. Mental status is at baseline.  Psychiatric:        Mood and Affect: Mood normal.        Behavior: Behavior normal.      Lab Results  Component Value Date   WBC 5.0 08/23/2023   HGB 14.4 08/23/2023   HCT 43.4 08/23/2023   PLT 175 08/23/2023   GLUCOSE  106 (H) 08/30/2023   CHOL 125 08/23/2023   TRIG 65 08/23/2023   HDL 51 08/23/2023   LDLCALC 60 08/23/2023   ALT 27 08/23/2023   AST 23 08/23/2023   NA 142 08/30/2023   K  4.3 08/30/2023   CL 103 08/30/2023   CREATININE 1.24 08/30/2023   BUN 15 08/30/2023   CO2 23 08/30/2023   TSH 0.854 08/23/2023   INR 0.9 05/25/2022   HGBA1C 5.3 08/23/2023      Assessment & Plan:  Primary hypertension Assessment & Plan: Slightly elevated today.  No headaches or blurred vision. He just came from work.  BP Readings from Last 3 Encounters:  12/08/23 138/62  09/20/23 118/72  09/13/23 122/79    -Continue Bystolic  10mg  daily.   Orders: -     Nebivolol  HCl; Take 1 tablet (10 mg total) by mouth daily.  Dispense: 30 tablet; Refill: 2 -     Comprehensive metabolic panel with GFR -     CBC with Differential/Platelet -     Lipid panel  Ischemic vascular disease Assessment & Plan: Well controlled Recommended that patient start taking a 81 mg Aspirin by mouth once daily   Vitamin D  deficiency Assessment & Plan: Recent labs showed low Vitamin D  levels, 23.9. Patient did not fill prescription due to cost. -Taking OTC Vitamin D  supplementation. - labs drawn today.  Orders: -     VITAMIN D  25 Hydroxy (Vit-D Deficiency, Fractures)  Elevated serum glucose Assessment & Plan: Reports feeling sick after eating sweets. -Order A1c due to elevated glucose level on last glucose lab  Orders: -     Hemoglobin A1c  Attention deficit hyperactivity disorder (ADHD), combined type Assessment & Plan: Currently managed on Adderall, does not like the side effects and prefers another medication that he was on before that had availability problems. He is hopeful things are better now. - Discontinue Adderall - Start Vyvanse 50 mg by mouth once daily    Orders: -     Lisdexamfetamine Dimesylate; Take 1 capsule (50 mg total) by mouth daily.  Dispense: 30 capsule; Refill: 0  Shellfish allergy Assessment & Plan: Allergic reaction to shellfish. New allergy.  - Epi pen sent to pharmacy due to severity of reaction described  Orders: -     EPINEPHrine ; Inject 0.3 mg into the  muscle as needed for anaphylaxis.  Dispense: 1 each; Refill: 1  Screen for colon cancer -     Ambulatory referral to Gastroenterology     Meds ordered this encounter  Medications   nebivolol  (BYSTOLIC ) 10 MG tablet    Sig: Take 1 tablet (10 mg total) by mouth daily.    Dispense:  30 tablet    Refill:  2   lisdexamfetamine (VYVANSE) 50 MG capsule    Sig: Take 1 capsule (50 mg total) by mouth daily.    Dispense:  30 capsule    Refill:  0   EPINEPHrine  0.3 mg/0.3 mL IJ SOAJ injection    Sig: Inject 0.3 mg into the muscle as needed for anaphylaxis.    Dispense:  1 each    Refill:  1    Orders Placed This Encounter  Procedures   VITAMIN D  25 Hydroxy (Vit-D Deficiency, Fractures)   Comprehensive metabolic panel with GFR   Hemoglobin A1c   CBC with Differential   Lipid Panel   Ambulatory referral to Gastroenterology     Follow-up: Return in about 3 months (around 03/09/2024) for chronic, med check.   An  After Visit Summary was printed and given to the patient.  Delford Felling, FNP [facility] [phone]

## 2023-12-08 NOTE — Assessment & Plan Note (Signed)
 Allergic reaction to shellfish. New allergy.  - Epi pen sent to pharmacy due to severity of reaction described

## 2023-12-08 NOTE — Assessment & Plan Note (Signed)
 Reports feeling sick after eating sweets. -Order A1c due to elevated glucose level on last glucose lab

## 2023-12-08 NOTE — Assessment & Plan Note (Signed)
 Recent labs showed low Vitamin D  levels, 23.9. Patient did not fill prescription due to cost. -Taking OTC Vitamin D  supplementation. - labs drawn today.

## 2023-12-08 NOTE — Assessment & Plan Note (Signed)
 Slightly elevated today.  No headaches or blurred vision. He just came from work.  BP Readings from Last 3 Encounters:  12/08/23 138/62  09/20/23 118/72  09/13/23 122/79    -Continue Bystolic  10mg  daily.

## 2023-12-08 NOTE — Assessment & Plan Note (Signed)
 Currently managed on Adderall, does not like the side effects and prefers another medication that he was on before that had availability problems. He is hopeful things are better now. - Discontinue Adderall - Start Vyvanse 50 mg by mouth once daily

## 2023-12-08 NOTE — Assessment & Plan Note (Addendum)
 Well controlled Recommended that patient start taking a 81 mg Aspirin by mouth once daily

## 2023-12-09 LAB — LIPID PANEL
Chol/HDL Ratio: 3.1 ratio (ref 0.0–5.0)
Cholesterol, Total: 134 mg/dL (ref 100–199)
HDL: 43 mg/dL (ref >39)
LDL Chol Calc (NIH): 72 mg/dL (ref 0–99)
Triglycerides: 101 mg/dL (ref 0–149)
VLDL Cholesterol Cal: 19 mg/dL (ref 5–40)

## 2023-12-09 LAB — COMPREHENSIVE METABOLIC PANEL WITH GFR
ALT: 24 IU/L (ref 0–44)
AST: 24 IU/L (ref 0–40)
Albumin: 4.4 g/dL (ref 4.1–5.1)
Alkaline Phosphatase: 84 IU/L (ref 44–121)
BUN/Creatinine Ratio: 15 (ref 9–20)
BUN: 13 mg/dL (ref 6–24)
Bilirubin Total: 0.6 mg/dL (ref 0.0–1.2)
CO2: 26 mmol/L (ref 20–29)
Calcium: 9.1 mg/dL (ref 8.7–10.2)
Chloride: 103 mmol/L (ref 96–106)
Creatinine, Ser: 0.85 mg/dL (ref 0.76–1.27)
Globulin, Total: 1.7 g/dL (ref 1.5–4.5)
Glucose: 93 mg/dL (ref 70–99)
Potassium: 4.3 mmol/L (ref 3.5–5.2)
Sodium: 141 mmol/L (ref 134–144)
Total Protein: 6.1 g/dL (ref 6.0–8.5)
eGFR: 110 mL/min/{1.73_m2} (ref >59)

## 2023-12-09 LAB — CBC WITH DIFFERENTIAL/PLATELET
Basophils Absolute: 0 10*3/uL (ref 0.0–0.2)
Basos: 1 %
EOS (ABSOLUTE): 0.1 10*3/uL (ref 0.0–0.4)
Eos: 3 %
Hematocrit: 41 % (ref 37.5–51.0)
Hemoglobin: 13.9 g/dL (ref 13.0–17.7)
Immature Grans (Abs): 0 10*3/uL (ref 0.0–0.1)
Immature Granulocytes: 0 %
Lymphocytes Absolute: 1.2 10*3/uL (ref 0.7–3.1)
Lymphs: 29 %
MCH: 31 pg (ref 26.6–33.0)
MCHC: 33.9 g/dL (ref 31.5–35.7)
MCV: 91 fL (ref 79–97)
Monocytes Absolute: 0.4 10*3/uL (ref 0.1–0.9)
Monocytes: 11 %
Neutrophils Absolute: 2.3 10*3/uL (ref 1.4–7.0)
Neutrophils: 56 %
Platelets: 195 10*3/uL (ref 150–450)
RBC: 4.49 x10E6/uL (ref 4.14–5.80)
RDW: 12.3 % (ref 11.6–15.4)
WBC: 4 10*3/uL (ref 3.4–10.8)

## 2023-12-09 LAB — HEMOGLOBIN A1C
Est. average glucose Bld gHb Est-mCnc: 108 mg/dL
Hgb A1c MFr Bld: 5.4 % (ref 4.8–5.6)

## 2023-12-09 LAB — VITAMIN D 25 HYDROXY (VIT D DEFICIENCY, FRACTURES): Vit D, 25-Hydroxy: 31.3 ng/mL (ref 30.0–100.0)

## 2023-12-20 ENCOUNTER — Ambulatory Visit: Admitting: Family Medicine

## 2023-12-20 ENCOUNTER — Encounter: Payer: Self-pay | Admitting: Family Medicine

## 2023-12-20 VITALS — BP 102/64 | HR 99 | Temp 98.5°F | Resp 16 | Ht 73.0 in | Wt 182.8 lb

## 2023-12-20 DIAGNOSIS — J029 Acute pharyngitis, unspecified: Secondary | ICD-10-CM | POA: Diagnosis not present

## 2023-12-20 DIAGNOSIS — J020 Streptococcal pharyngitis: Secondary | ICD-10-CM | POA: Insufficient documentation

## 2023-12-20 DIAGNOSIS — R6889 Other general symptoms and signs: Secondary | ICD-10-CM | POA: Diagnosis not present

## 2023-12-20 DIAGNOSIS — F902 Attention-deficit hyperactivity disorder, combined type: Secondary | ICD-10-CM

## 2023-12-20 LAB — POC COVID19 BINAXNOW: SARS Coronavirus 2 Ag: NEGATIVE

## 2023-12-20 LAB — POCT INFLUENZA A/B
Influenza A, POC: NEGATIVE
Influenza B, POC: NEGATIVE

## 2023-12-20 LAB — POCT RAPID STREP A (OFFICE): Rapid Strep A Screen: POSITIVE — AB

## 2023-12-20 MED ORDER — AMOXICILLIN 875 MG PO TABS: 875.0000 mg | ORAL_TABLET | Freq: Two times a day (BID) | ORAL | 0 refills | Status: AC

## 2023-12-20 MED ORDER — METHYLPHENIDATE HCL ER (OSM) 36 MG PO TBCR
36.0000 mg | EXTENDED_RELEASE_TABLET | Freq: Every day | ORAL | 0 refills | Status: DC
Start: 2023-12-20 — End: 2024-01-27

## 2023-12-20 NOTE — Assessment & Plan Note (Addendum)
 Strep throat confirmed. Symptoms include shortness of breath, chest tightness, and greenish-yellow sputum. Allergy to vancomycin  noted. - Prescribe Amoxicillin 875 mg by mouth TWICE A DAY for 10 days - Advise rest and hydration. - Monitor symptoms and follow up if no improvement.

## 2023-12-20 NOTE — Progress Notes (Signed)
 Acute Office Visit  Subjective:    Patient ID: George Shelton, male    DOB: 07-Jul-1981, 43 y.o.   MRN: [id number]  Chief Complaint  Patient presents with   Nasal Congestion   Cough   URI   ADHD   Discussed the use of AI scribe software for clinical note transcription with the patient, who gave verbal consent to proceed.  History of Present Illness   George Shelton is a 43 year old male who presents with symptoms of a respiratory infection and concerns about medication efficacy for ADHD.  He has been experiencing symptoms consistent with a respiratory infection, including dizziness, chest tightness, shortness of breath, and a greenish-yellow productive cough. His throat was sore last week but is not currently painful, although he feels generally 'sicker'. He has also experienced a complete loss of smell since yesterday.  He describes sinus pain and pressure, particularly when bending over, and notes that his eyes are puffy. He experiences chest pain associated with shortness of breath and requires frequent use of his inhaler, which does not seem to alleviate his symptoms. No fever, but he mentions chills, decreased appetite, and increased thirst. Ear pain is present but subsiding. He has a history of adverse reactions to certain antibiotics, specifically flacomycin, which causes hemolytic reactions.  He is currently on Vyvanse  for ADHD but finds it ineffective, necessitating two doses a day. He attributes this to his high metabolism and is interested in trying a different medication.  He has a history of high blood pressure but has not taken his medication for two days, noting that his blood pressure is currently lower than usual. He avoids narcotics for pain management due to concerns about addiction and adverse effects.       Past Medical History:  Diagnosis Date   Abscess 04/20/2022   Abscess of right groin 04/20/2022   Angina at rest [facility])    Asthma, chronic 05/25/2022    Attention deficit hyperactivity disorder (ADHD), combined type 08/23/2023   Cellulitis 05/25/2022   Cellulitis of right foot 03/15/2022   Chronic fatigue 08/23/2023   Chronic osteomyelitis involving ankle and foot, right (HCC) 06/07/2022   Cough with hemoptysis 05/25/2022   Crush injury to foot, right, initial encounter 03/15/2022   Elevated serum glucose 08/23/2023   Hepatitis C antibody positive in blood 12/15/2021   HSV-1 (herpes simplex virus 1) infection    lip ulcer per Pt   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   Infection of right wrist (HCC) 07/12/2021   Left atrial enlargement    Lymphangitis 05/25/2022   Medication monitoring encounter 12/30/2021   MRSA (methicillin resistant staph aureus) culture positive    Neuropathic pain 05/25/2022   Nicotine dependence    quit July 6,2023   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) 10/01/2022   Osteomyelitis (HCC) 06/09/2022   PICC (peripherally inserted central catheter) in place 12/30/2021   Polysubstance abuse (HCC)    Primary hypertension 08/23/2023   S/P transmetatarsal amputation of foot, right (HCC) 10/01/2022   Seasonal allergies 08/23/2023   Sepsis (HCC)    Sleep apnea    Does not use CPAP (But is supposed to)   Wrist joint infection (HCC) 07/14/2021    Past Surgical History:  Procedure Laterality Date   INCISION AND DRAINAGE ABSCESS Right 05/27/2022   Procedure: INCISION AND DRAINAGE ABSCESS;  Surgeon: Evertt Hoe, DPM;  Location: [facility];  Service:  Podiatry;  Laterality: Right;   IRRIGATION AND DEBRIDEMENT ABSCESS Right 04/20/2022   Procedure: IRRIGATION AND DEBRIDEMENT groinABSCESS;  Surgeon: Shela Derby, MD;  Location: [facility];  Service: General;  Laterality: Right;   TOE AMPUTATION     Big Toe   TRANSMETATARSAL AMPUTATION Right 03/16/2022   Procedure: TRANSMETATARSAL AMPUTATION/I&D RIGHT FOOT/BONE BIOPSY;  Surgeon: Charity Conch, DPM;  Location: [facility];  Service: Podiatry;  Laterality: Right;  patient would like a block    Family History  Problem Relation Age of Onset   Heart attack Father    Hypertension Father    Diabetes Father    Melanoma Father    Sleep apnea Father    Hypertension Maternal Grandmother    Atrial fibrillation Maternal Grandmother    Hypertension Maternal Grandfather    Pancreatic cancer Paternal Grandmother     Social History   Socioeconomic History   Marital status: Married    Spouse name: Deontay Ladnier   Number of children: 2   Years of education: Not on file   Highest education level: Not on file  Occupational History   Not on file  Tobacco Use   Smoking status: Former    Current packs/day: 0.00    Types: Cigarettes    Quit date: 11/2021    Years since quitting: 2.0   Smokeless tobacco: Former    Types: Associate Professor status: Never Used  Substance and Sexual Activity   Alcohol use: Not Currently    Comment: occasionally   Drug use: Not Currently   Sexual activity: Yes    Partners: Female  Other Topics Concern   Not on file  Social History Narrative   Not on file   Social Drivers of Health   Financial Resource Strain: High Risk (10/01/2022)   Received from [facility], [facility]   Overall Financial Resource Strain (CARDIA)    Difficulty of Paying Living Expenses: Hard  Food Insecurity: No Food Insecurity (08/23/2023)   Hunger Vital Sign    Worried About Running Out of Food in the Last Year: Never true    Ran Out of Food in the Last Year: Never true  Transportation Needs: No Transportation Needs (10/01/2022)   Received from [employer], [facility]   PRAPARE - Transportation    Lack of Transportation (Medical): No    Lack of Transportation (Non-Medical): No  Physical Activity: Inactive (08/23/2023)   Exercise Vital Sign    Days of Exercise per Week: 0 days    Minutes of Exercise per Session: 0 min  Stress: No Stress Concern  Present (08/23/2023)   [employer] of Occupational Health - Occupational Stress Questionnaire    Feeling of Stress : Not at all  Social Connections: Moderately Isolated (08/23/2023)   Social Connection and Isolation Panel [NHANES]    Frequency of Communication with Friends and Family: More than three times a week    Frequency of Social Gatherings with Friends and Family: More than three times a week    Attends Religious Services: Never    Database administrator or Organizations: No    Attends Banker Meetings: Never    Marital Status: Married  Catering manager Violence: Not At Risk (08/23/2023)   Humiliation, Afraid, Rape, and Kick questionnaire    Fear of Current or Ex-Partner: No    Emotionally Abused: No    Physically Abused: No    Sexually Abused: No    Outpatient Medications Prior to Visit  Medication Sig Dispense Refill   albuterol  (VENTOLIN  HFA) 108 (90 Base) MCG/ACT inhaler NEED ALBUTEROL  DIRECTIONS 18 g 1   aspirin EC 81 MG tablet Take 81 mg by mouth daily. Swallow whole.     EPINEPHrine  0.3 mg/0.3 mL IJ SOAJ injection Inject 0.3 mg into the muscle as needed for anaphylaxis. 1 each 1   FLOVENT  HFA 110 MCG/ACT inhaler Inhale 2 puffs into the lungs 2 (two) times daily. 1 each 1   fluticasone  furoate-vilanterol (BREO ELLIPTA ) 200-25 MCG/ACT AEPB Inhale 1 puff into the lungs daily. 30 each 1   lisdexamfetamine (VYVANSE ) 50 MG capsule Take 1 capsule (50 mg total) by mouth daily. 30 capsule 0   montelukast  (SINGULAIR ) 10 MG tablet Take 1 tablet (10 mg total) by mouth at bedtime. 30 tablet 3   nebivolol  (BYSTOLIC ) 10 MG tablet Take 1 tablet (10 mg total) by mouth daily. 30 tablet 2   valACYclovir  (VALTREX ) 500 MG tablet Take 1 tablet (500 mg total) by mouth daily. 30 tablet 2   amphetamine -dextroamphetamine  (ADDERALL) 30 MG tablet Take 1 tablet by mouth 2 (two) times daily. 60 tablet 0   metoprolol  tartrate (LOPRESSOR ) 100 MG tablet Take 1 tablet (100 mg total) by  mouth once for 1 dose. Please take this medication 2 hours before CT 1 tablet 0   No facility-administered medications prior to visit.    Allergies  Allergen Reactions   Codeine Other (See Comments)    PATIENT DOES NOT WANT THIS   Other Other (See Comments)    History of drug addiction, per the patient- does NOT want pain medication of any kind. Sober/clean for 8 1/2 years.  Patient states "NO OPIATES"!!!   Shrimp Flavor Agent (Non-Screening) Swelling   Vancomycin  Other (See Comments)    "It kills my white blood cells"- PATIENT SAID HE **WILL NOT, UNDER ANY CIRCUMSTANCES** TAKE THIS   Shrimp (Diagnostic) Swelling    Review of Systems  Constitutional:  Positive for chills. Negative for diaphoresis, fatigue and fever.  HENT:  Positive for ear pain, sinus pain, sneezing, sore throat (last week) and voice change. Negative for congestion.        Loss of smell  Eyes:  Positive for pain and visual disturbance.  Respiratory:  Positive for cough (productive - greenish yellow), chest tightness and shortness of breath.   Cardiovascular:  Negative for chest pain.  Gastrointestinal:  Negative for abdominal pain, constipation, nausea and vomiting.  Endocrine: Positive for polydipsia.  Genitourinary:  Negative for dysuria.  Musculoskeletal:  Negative for arthralgias.  Neurological:  Positive for weakness. Negative for headaches.  Psychiatric/Behavioral:  Negative for dysphoric mood. The patient is not nervous/anxious.        Objective:         12/20/2023   10:04 AM 12/08/2023    1:20 PM 09/20/2023    2:30 PM  Vitals with BMI  Height 6\' 1"  6\' 1"  6\' 1"   Weight 182 lbs 13 oz 190 lbs 3 oz 198 lbs  BMI 24.12 25.1 26.13  Systolic 102 138 161  Diastolic 64 62 72  Pulse 99 89 83    No data found.   Physical Exam Vitals reviewed.  Constitutional:      General: He is not in acute distress.    Appearance: Normal appearance. He is ill-appearing.  HENT:     Right Ear: Decreased hearing  noted. Tenderness present.     Left Ear: Decreased hearing noted. Tenderness present.     Nose: Congestion present.  Right Sinus: Maxillary sinus tenderness and frontal sinus tenderness present.     Left Sinus: Maxillary sinus tenderness and frontal sinus tenderness present.     Mouth/Throat:     Pharynx: Posterior oropharyngeal erythema present.     Tonsils: 3+ on the right. 3+ on the left.  Eyes:     Conjunctiva/sclera: Conjunctivae normal.  Cardiovascular:     Rate and Rhythm: Normal rate and regular rhythm.     Heart sounds: Normal heart sounds. No murmur heard. Pulmonary:     Effort: Pulmonary effort is normal.     Breath sounds: Normal breath sounds. No wheezing.  Abdominal:     General: Bowel sounds are normal.     Palpations: Abdomen is soft.     Tenderness: There is no abdominal tenderness.  Musculoskeletal:        General: Normal range of motion.     Cervical back: Normal range of motion.  Skin:    General: Skin is warm.     Coloration: Jaundice: .hpisec.  Neurological:     Mental Status: He is alert. Mental status is at baseline.  Psychiatric:        Mood and Affect: Mood normal.        Behavior: Behavior normal.     Health Maintenance Due  Topic Date Due   Hepatitis C Screening  Never done   DTaP/Tdap/Td (1 - Tdap) Never done   Pneumococcal Vaccine 55-47 Years old (1 of 2 - PCV) Never done   COVID-19 Vaccine (1 - 2024-25 season) Never done    There are no preventive care reminders to display for this patient.   Lab Results  Component Value Date   TSH 0.854 08/23/2023   Lab Results  Component Value Date   WBC 4.0 12/08/2023   HGB 13.9 12/08/2023   HCT 41.0 12/08/2023   MCV 91 12/08/2023   PLT 195 12/08/2023   Lab Results  Component Value Date   NA 141 12/08/2023   K 4.3 12/08/2023   CO2 26 12/08/2023   GLUCOSE 93 12/08/2023   BUN 13 12/08/2023   CREATININE 0.85 12/08/2023   BILITOT 0.6 12/08/2023   ALKPHOS 84 12/08/2023   AST 24  12/08/2023   ALT 24 12/08/2023   PROT 6.1 12/08/2023   ALBUMIN 4.4 12/08/2023   CALCIUM 9.1 12/08/2023   ANIONGAP 9 06/03/2022   EGFR 110 12/08/2023   Lab Results  Component Value Date   CHOL 134 12/08/2023   Lab Results  Component Value Date   HDL 43 12/08/2023   Lab Results  Component Value Date   LDLCALC 72 12/08/2023   Lab Results  Component Value Date   TRIG 101 12/08/2023   Lab Results  Component Value Date   CHOLHDL 3.1 12/08/2023   Lab Results  Component Value Date   HGBA1C 5.4 12/08/2023       Assessment & Plan:  Strep pharyngitis Assessment & Plan: Strep throat confirmed. Symptoms include shortness of breath, chest tightness, and greenish-yellow sputum. Allergy to vancomycin  noted. - Prescribe Amoxicillin 875 mg by mouth TWICE A DAY for 10 days - Advise rest and hydration. - Monitor symptoms and follow up if no improvement.  Orders: -     Amoxicillin; Take 1 tablet (875 mg total) by mouth 2 (two) times daily for 10 days.  Dispense: 20 tablet; Refill: 0  Flu-like symptoms Assessment & Plan: Acute symptoms, suggestive of Flu or Covid. (cough, sore throat, loss of smell, shortness of breath, chills  and sinus pressure) Flu A&B - negative Covid - negative  - Recommend rest and increased fluid intake. - Advise on over-the-counter decongestants or saline nasal sprays.  Orders: -     POC COVID-19 BinaxNow -     POCT Influenza A/B  Sore throat Assessment & Plan: Strep test - positive - Try honey and lemon for sore throat - Gargle with warm salt water - Try hot tea to soothe the throat   Orders: -     POCT rapid strep A  Attention deficit hyperactivity disorder (ADHD), combined type Assessment & Plan: Current Vyvanse  50 mg inadequate. Discussed transitioning to Relexxii for sustained management. Requires prior authorization and cost-effective options discussed. - Initiate Relexxii at 36 mg, with option to increase by 18 mg increments every 7 days,  up to 72 mg. - Advise use of GoodRx or manufacturer coupons to reduce costs. - Instruct to taper off Vyvanse  while starting Relexxii. - Schedule follow-up in two weeks to assess effectiveness.  Orders: -     Methylphenidate HCl ER (OSM); Take 1 tablet (36 mg total) by mouth daily.  Dispense: 30 tablet; Refill: 0     Meds ordered this encounter  Medications   methylphenidate (RELEXXII) 36 MG PO CR tablet    Sig: Take 1 tablet (36 mg total) by mouth daily.    Dispense:  30 tablet    Refill:  0    BIN [id number] PCN [id number] [id number] card ID [id number] exp [id number]   amoxicillin (AMOXIL) 875 MG tablet    Sig: Take 1 tablet (875 mg total) by mouth 2 (two) times daily for 10 days.    Dispense:  20 tablet    Refill:  0    Orders Placed This Encounter  Procedures   Rapid Strep A   POC COVID-19   POCT Influenza A/B        Follow-up: Return in about 1 week (around 12/27/2023) for med check.  An After Visit Summary was printed and given to the patient.  Delford Felling, FNP [facility] [phone]

## 2023-12-20 NOTE — Assessment & Plan Note (Signed)
 Current Vyvanse  50 mg inadequate. Discussed transitioning to Relexxii for sustained management. Requires prior authorization and cost-effective options discussed. - Initiate Relexxii at 36 mg, with option to increase by 18 mg increments every 7 days, up to 72 mg. - Advise use of GoodRx or manufacturer coupons to reduce costs. - Instruct to taper off Vyvanse  while starting Relexxii. - Schedule follow-up in two weeks to assess effectiveness.

## 2023-12-20 NOTE — Assessment & Plan Note (Addendum)
 Acute symptoms, suggestive of Flu or Covid. (cough, sore throat, loss of smell, shortness of breath, chills and sinus pressure) Flu A&B - negative Covid - negative  - Recommend rest and increased fluid intake. - Advise on over-the-counter decongestants or saline nasal sprays.

## 2023-12-20 NOTE — Assessment & Plan Note (Signed)
 Strep test - positive - Try honey and lemon for sore throat - Gargle with warm salt water - Try hot tea to soothe the throat

## 2023-12-27 ENCOUNTER — Ambulatory Visit: Admitting: Family Medicine

## 2024-01-27 ENCOUNTER — Ambulatory Visit: Admitting: Family Medicine

## 2024-01-27 ENCOUNTER — Encounter: Payer: Self-pay | Admitting: Family Medicine

## 2024-01-27 VITALS — BP 122/64 | HR 60 | Temp 98.1°F | Resp 16 | Ht 73.0 in | Wt 184.2 lb

## 2024-01-27 DIAGNOSIS — F902 Attention-deficit hyperactivity disorder, combined type: Secondary | ICD-10-CM | POA: Diagnosis not present

## 2024-01-27 DIAGNOSIS — L237 Allergic contact dermatitis due to plants, except food: Secondary | ICD-10-CM | POA: Diagnosis not present

## 2024-01-27 MED ORDER — PREDNISONE 20 MG PO TABS: ORAL_TABLET | ORAL | 0 refills | Status: AC

## 2024-01-27 MED ORDER — TRIAMCINOLONE ACETONIDE 0.1 % EX CREA
1.0000 | TOPICAL_CREAM | Freq: Two times a day (BID) | CUTANEOUS | 0 refills | Status: AC
Start: 2024-01-27 — End: ?

## 2024-01-27 MED ORDER — DEXMETHYLPHENIDATE HCL ER 30 MG PO CP24: 30.0000 mg | ORAL_CAPSULE | Freq: Every day | ORAL | 0 refills | Status: DC

## 2024-01-27 MED ORDER — METHYLPHENIDATE HCL ER (CD) 60 MG PO CPCR: 60.0000 mg | ORAL_CAPSULE | ORAL | 0 refills | Status: DC

## 2024-01-27 NOTE — Patient Instructions (Signed)
  VISIT SUMMARY: Today, you were seen for a rash and itching likely caused by poison ivy or poison oak exposure. We also discussed your ADHD management and a recent visual disturbance.  YOUR PLAN: -CONTACT DERMATITIS: Contact dermatitis is a skin reaction resulting from exposure to an irritant or allergen, such as poison ivy or poison oak. We will treat this with a prednisone dose pack to reduce inflammation and itching, a topical steroid cream, and an over-the-counter antihistamine to help with the itching. If the oral medications do not work, we may consider a steroid injection.  -ATTENTION DEFICIT HYPERACTIVITY DISORDER (ADHD): ADHD is a condition characterized by symptoms of inattention, hyperactivity, and impulsivity. We will switch your medication from Vyvanse  to Ranexa (methylphenidate ) at a dose of 60 mg, and taper your Vyvanse  to 30 mg. This should help manage your symptoms more effectively. Stress reduction techniques may also help control your blood pressure.  INSTRUCTIONS: Your prescriptions for prednisone, topical steroid cream, and Ranexa (methylphenidate ) have been sent to [facility] pharmacy. Please follow the dosage instructions provided. If your rash does not improve with the oral medications, contact us  for a possible steroid injection. Continue to monitor your blood pressure and practice stress reduction techniques. Follow up with us  if you experience any new or worsening symptoms.                      Contains text generated by Abridge.                                 Contains text generated by Abridge.

## 2024-01-27 NOTE — Progress Notes (Unsigned)
 Subjective:  Patient ID: George Shelton, male    DOB: June 04, 1981  Age: 43 y.o. MRN: [id number]  Chief Complaint  Patient presents with   Poison Ivy    X two days.    Discussed the use of AI scribe software for clinical note transcription with the patient, who gave verbal consent to proceed.  History of Present Illness   George Shelton is a 43 year old male who presents with a rash and itching, likely due to poison ivy or poison oak exposure.  Dermatologic symptoms - Widespread pruritic rash following exposure to poison ivy or poison oak while mowing the yard - Pruritus began prior to visible rash onset - Severe itching affecting legs and right eye - No use of topical creams or over-the-counter treatments for relief  Attention deficit hyperactivity disorder (adhd) management - Currently using methylphenidate  HCL, which is effective for maintaining energy levels and work performance - Out of methylphenidate  HCL and requires a refill - Previously used Adderall, which was ineffective - Transitioning from Vyvanse  to Ranexa - Blood pressure has remained stable without medication while on these treatments  Visual disturbance - Progressive worsening of eyesight          09/20/2023    2:32 PM 04/01/2022   10:24 AM 12/15/2021    9:22 AM  Depression screen PHQ 2/9  Decreased Interest 0 0 0  Down, Depressed, Hopeless 0 0 1  PHQ - 2 Score 0 0 1        09/20/2023    2:32 PM  Fall Risk   Falls in the past year? 0  Number falls in past yr: 0  Injury with Fall? 0  Risk for fall due to : No Fall Risks  Follow up Falls evaluation completed    Patient Care Team: Teressa Harrie HERO, FNP as PCP - General (Family Medicine)   Review of Systems  Constitutional:  Negative for appetite change, fatigue and fever.  HENT:  Negative for congestion, ear pain, sinus pressure and sore throat.   Eyes: Negative.   Respiratory:  Negative for cough, chest tightness, shortness of breath and  wheezing.   Cardiovascular:  Negative for chest pain and palpitations.  Gastrointestinal:  Negative for abdominal pain, constipation, diarrhea, nausea and vomiting.  Endocrine: Negative.   Genitourinary:  Negative for dysuria and hematuria.  Musculoskeletal:  Negative for arthralgias, back pain, joint swelling and myalgias.  Skin:  Positive for rash.  Allergic/Immunologic: Negative.   Neurological:  Negative for dizziness, weakness and headaches.  Hematological: Negative.   Psychiatric/Behavioral:  Positive for decreased concentration. Negative for dysphoric mood. The patient is not nervous/anxious.     Current Outpatient Medications on File Prior to Visit  Medication Sig Dispense Refill   albuterol  (VENTOLIN  HFA) 108 (90 Base) MCG/ACT inhaler NEED ALBUTEROL  DIRECTIONS 18 g 1   aspirin EC 81 MG tablet Take 81 mg by mouth daily. Swallow whole.     EPINEPHrine  0.3 mg/0.3 mL IJ SOAJ injection Inject 0.3 mg into the muscle as needed for anaphylaxis. 1 each 1   FLOVENT  HFA 110 MCG/ACT inhaler Inhale 2 puffs into the lungs 2 (two) times daily. 1 each 1   fluticasone  furoate-vilanterol (BREO ELLIPTA ) 200-25 MCG/ACT AEPB Inhale 1 puff into the lungs daily. 30 each 1   lisdexamfetamine (VYVANSE ) 50 MG capsule Take 1 capsule (50 mg total) by mouth daily. 30 capsule 0   methylphenidate  (RELEXXII ) 36 MG PO CR tablet Take 1 tablet (36 mg total) by mouth daily.  30 tablet 0   metoprolol  tartrate (LOPRESSOR ) 100 MG tablet Take 1 tablet (100 mg total) by mouth once for 1 dose. Please take this medication 2 hours before CT 1 tablet 0   montelukast  (SINGULAIR ) 10 MG tablet Take 1 tablet (10 mg total) by mouth at bedtime. 30 tablet 3   nebivolol  (BYSTOLIC ) 10 MG tablet Take 1 tablet (10 mg total) by mouth daily. 30 tablet 2   valACYclovir  (VALTREX ) 500 MG tablet Take 1 tablet (500 mg total) by mouth daily. 30 tablet 2   No current facility-administered medications on file prior to visit.   Past Medical  History:  Diagnosis Date   Abscess 04/20/2022   Abscess of right groin 04/20/2022   Angina at rest [facility])    Asthma, chronic 05/25/2022   Attention deficit hyperactivity disorder (ADHD), combined type 08/23/2023   Cellulitis 05/25/2022   Cellulitis of right foot 03/15/2022   Chronic fatigue 08/23/2023   Chronic osteomyelitis involving ankle and foot, right (HCC) 06/07/2022   Cough with hemoptysis 05/25/2022   Crush injury to foot, right, initial encounter 03/15/2022   Elevated serum glucose 08/23/2023   Hepatitis C antibody positive in blood 12/15/2021   HSV-1 (herpes simplex virus 1) infection    lip ulcer per Pt   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   Infection of right wrist (HCC) 07/12/2021   Left atrial enlargement    Lymphangitis 05/25/2022   Medication monitoring encounter 12/30/2021   MRSA (methicillin resistant staph aureus) culture positive    Neuropathic pain 05/25/2022   Nicotine dependence    quit July 6,2023   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) 10/01/2022   Osteomyelitis (HCC) 06/09/2022   PICC (peripherally inserted central catheter) in place 12/30/2021   Polysubstance abuse (HCC)    Primary hypertension 08/23/2023   S/P transmetatarsal amputation of foot, right (HCC) 10/01/2022   Seasonal allergies 08/23/2023   Sepsis (HCC)    Sleep apnea    Does not use CPAP (But is supposed to)   Wrist joint infection (HCC) 07/14/2021   Past Surgical History:  Procedure Laterality Date   INCISION AND DRAINAGE ABSCESS Right 05/27/2022   Procedure: INCISION AND DRAINAGE ABSCESS;  Surgeon: Malvin Marsa FALCON, DPM;  Location: [facility];  Service: Podiatry;  Laterality: Right;   IRRIGATION AND DEBRIDEMENT ABSCESS Right 04/20/2022   Procedure: IRRIGATION AND DEBRIDEMENT groinABSCESS;  Surgeon: Rubin Calamity, MD;  Location: [facility];  Service: General;  Laterality: Right;   TOE AMPUTATION      Big Toe   TRANSMETATARSAL AMPUTATION Right 03/16/2022   Procedure: TRANSMETATARSAL AMPUTATION/I&D RIGHT FOOT/BONE BIOPSY;  Surgeon: Gershon Donnice SAUNDERS, DPM;  Location: [facility];  Service: Podiatry;  Laterality: Right;  patient would like a block    Family History  Problem Relation Age of Onset   Heart attack Father    Hypertension Father    Diabetes Father    Melanoma Father    Sleep apnea Father    Hypertension Maternal Grandmother    Atrial fibrillation Maternal Grandmother    Hypertension Maternal Grandfather    Pancreatic cancer Paternal Grandmother    Social History   Socioeconomic History   Marital status: Married    Spouse name: Lazlo Tunney   Number of children: 2   Years of education: Not on file   Highest education level: Not on file  Occupational History   Not on file  Tobacco Use  Smoking status: Former    Current packs/day: 0.00    Types: Cigarettes    Quit date: 11/2021    Years since quitting: 2.1   Smokeless tobacco: Former    Types: Associate Professor status: Never Used  Substance and Sexual Activity   Alcohol use: Not Currently    Comment: occasionally   Drug use: Not Currently   Sexual activity: Yes    Partners: Female  Other Topics Concern   Not on file  Social History Narrative   Not on file   Social Drivers of Health   Financial Resource Strain: High Risk (10/01/2022)   Received from [facility]   Overall Financial Resource Strain (CARDIA)    Difficulty of Paying Living Expenses: Hard  Food Insecurity: No Food Insecurity (08/23/2023)   Hunger Vital Sign    Worried About Running Out of Food in the Last Year: Never true    Ran Out of Food in the Last Year: Never true  Transportation Needs: No Transportation Needs (10/01/2022)   Received from [facility] - [facility]    Lack of Transportation (Medical): No    Lack of Transportation (Non-Medical): No  Physical Activity: Inactive (08/23/2023)   Exercise Vital Sign     Days of Exercise per Week: 0 days    Minutes of Exercise per Session: 0 min  Stress: No Stress Concern Present (08/23/2023)   [employer] of Occupational Health - Occupational Stress Questionnaire    Feeling of Stress : Not at all  Social Connections: Moderately Isolated (08/23/2023)   Social Connection and Isolation Panel    Frequency of Communication with Friends and Family: More than three times a week    Frequency of Social Gatherings with Friends and Family: More than three times a week    Attends Religious Services: Never    Database administrator or Organizations: No    Attends Engineer, structural: Never    Marital Status: Married    Objective:  BP 122/64   Pulse 60   Temp 98.1 F (36.7 C) (Temporal)   Resp 16   Ht 6' 1 (1.854 m)   Wt 184 lb 3.2 oz (83.6 kg)   SpO2 98%   BMI 24.30 kg/m      01/27/2024   11:01 AM 12/20/2023   10:04 AM 12/08/2023    1:20 PM  BP/Weight  Systolic BP 122 102 138  Diastolic BP 64 64 62  Wt. (Lbs) 184.2 182.8 190.2  BMI 24.3 kg/m2 24.12 kg/m2 25.09 kg/m2    Physical Exam Vitals reviewed.  Constitutional:      General: He is not in acute distress.    Appearance: Normal appearance.   Eyes:     Conjunctiva/sclera: Conjunctivae normal.    Cardiovascular:     Rate and Rhythm: Normal rate and regular rhythm.     Heart sounds: Normal heart sounds. No murmur heard. Pulmonary:     Effort: Pulmonary effort is normal.     Breath sounds: Normal breath sounds. No wheezing.  Abdominal:     General: Bowel sounds are normal.   Skin:    General: Skin is warm.   Neurological:     Mental Status: He is alert. Mental status is at baseline.   Psychiatric:        Mood and Affect: Mood normal.        Behavior: Behavior normal.          {Perform Simple  Foot Exam  Perform Detailed exam:1} {Insert foot Exam (Optional):30965}   Lab Results  Component Value Date   WBC 4.0 12/08/2023   HGB 13.9 12/08/2023   HCT 41.0  12/08/2023   PLT 195 12/08/2023   GLUCOSE 93 12/08/2023   CHOL 134 12/08/2023   TRIG 101 12/08/2023   HDL 43 12/08/2023   LDLCALC 72 12/08/2023   ALT 24 12/08/2023   AST 24 12/08/2023   NA 141 12/08/2023   K 4.3 12/08/2023   CL 103 12/08/2023   CREATININE 0.85 12/08/2023   BUN 13 12/08/2023   CO2 26 12/08/2023   TSH 0.854 08/23/2023   INR 0.9 05/25/2022   HGBA1C 5.4 12/08/2023      Assessment & Plan:  There are no diagnoses linked to this encounter.   No orders of the defined types were placed in this encounter.   No orders of the defined types were placed in this encounter.    Follow-up: No follow-ups on file.   I,Angela Taylor,acting as a Neurosurgeon for Harrie CHRISTELLA Cedar, FNP.,have documented all relevant documentation on the behalf of Harrie CHRISTELLA Cedar, FNP,as directed by  Harrie CHRISTELLA Cedar, FNP while in the presence of Harrie CHRISTELLA Cedar, FNP.   An After Visit Summary was printed and given to the patient.  Harrie CHRISTELLA Cedar, FNP [facility] [phone]

## 2024-01-28 DIAGNOSIS — L237 Allergic contact dermatitis due to plants, except food: Secondary | ICD-10-CM | POA: Insufficient documentation

## 2024-01-28 NOTE — Assessment & Plan Note (Signed)
 Improved blood pressure noted. Discussed switching to Ranexa (methylphenidate ) and dosage adjustments. Explained stress reduction may aid blood pressure control. - Initiate Ranexa (methylphenidate ) 60 mg. - Taper Vyvanse  to 30 mg.

## 2024-01-29 NOTE — Assessment & Plan Note (Signed)
 Likely due to poison ivy or oak exposure. Prefers oral medication. Discussed prednisone for inflammation and itching. Topical steroids and antihistamines recommended. Possible steroid injection if oral meds fail. - Prescribe prednisone dose pack. - Prescribe topical steroid cream. - Recommend over-the-counter antihistamine.

## 2024-02-06 DIAGNOSIS — K219 Gastro-esophageal reflux disease without esophagitis: Secondary | ICD-10-CM | POA: Diagnosis not present

## 2024-02-06 DIAGNOSIS — K921 Melena: Secondary | ICD-10-CM | POA: Diagnosis not present

## 2024-02-27 ENCOUNTER — Encounter: Payer: Self-pay | Admitting: Family Medicine

## 2024-02-27 ENCOUNTER — Ambulatory Visit: Admitting: Family Medicine

## 2024-02-27 VITALS — BP 138/72 | HR 82 | Temp 98.3°F | Resp 16 | Ht 73.0 in | Wt 190.4 lb

## 2024-02-27 DIAGNOSIS — F902 Attention-deficit hyperactivity disorder, combined type: Secondary | ICD-10-CM

## 2024-02-27 DIAGNOSIS — I10 Essential (primary) hypertension: Secondary | ICD-10-CM | POA: Diagnosis not present

## 2024-02-27 MED ORDER — DEXMETHYLPHENIDATE HCL ER 30 MG PO CP24: 30.0000 mg | ORAL_CAPSULE | Freq: Every day | ORAL | 0 refills | Status: DC

## 2024-02-27 MED ORDER — METHYLPHENIDATE HCL ER (CD) 60 MG PO CPCR: 60.0000 mg | ORAL_CAPSULE | ORAL | 0 refills | Status: DC

## 2024-02-27 MED ORDER — METOPROLOL TARTRATE 25 MG PO TABS: 25.0000 mg | ORAL_TABLET | ORAL | 3 refills | Status: DC

## 2024-02-27 NOTE — Assessment & Plan Note (Signed)
 Patient states that he hasn't taken his BP medication in 3 months. D Blood pressure slightly above target. Metoprolol  recommended despite improved readings without it. BP Readings from Last 3 Encounters:  02/27/24 138/72  01/27/24 122/64  12/20/23 102/64    - Reduce metoprolol  25 mg once daily. - FU in 2 months for labs and BP management

## 2024-02-27 NOTE — Progress Notes (Signed)
 Subjective:  Patient ID: George Shelton, male    DOB: [birth year]  Age: 43 y.o. MRN: [id number]  Chief Complaint  Patient presents with   Follow-up   Medical Management of Chronic Issues    Discussed the use of AI scribe software for clinical note transcription with the patient, who gave verbal consent to proceed.  History of Present Illness   George Shelton is a 43 year old male with hypertension who presents for medication management and follow-up.  He has not been taking his blood pressure medication for the past three months. Previously, his blood pressure was running at 160-170/120, but it is now consistently around 138/70. He attributes this improvement to better mental health and stress management. He is currently not taking any blood pressure medication but is open to starting a low dose if necessary.  He is currently taking Vyvanse  60 mg and Ranexa 30 mg. Vyvanse  does not last long enough, and he prefers the effects of Ranexa. He takes Vyvanse  around lunchtime, and it does not affect his sleep.  He has a history of a rash, described as a heat rash occurring where his belt goes. He has been using an itch spray that turns to powder to manage the symptoms. He also had poison ivy on his hand, which is improving.  He mentions a family history of diabetes, noting that his whole family has it.  He describes a recent incident where he had something in his eye and had to seek treatment at [facility], which he found unsatisfactory.          09/20/2023    2:32 PM 04/01/2022   10:24 AM 12/15/2021    9:22 AM  Depression screen PHQ 2/9  Decreased Interest 0 0 0  Down, Depressed, Hopeless 0 0 1  PHQ - 2 Score 0 0 1        09/20/2023    2:32 PM  Fall Risk   Falls in the past year? 0  Number falls in past yr: 0  Injury with Fall? 0  Risk for fall due to : No Fall Risks  Follow up Falls evaluation completed    Patient Care Team: Teressa Harrie HERO, FNP as PCP - General (Family  Medicine)   Review of Systems  Constitutional:  Negative for appetite change, fatigue and fever.  HENT:  Negative for congestion, ear pain, sinus pressure and sore throat.   Eyes: Negative.   Respiratory:  Negative for cough, chest tightness, shortness of breath and wheezing.   Cardiovascular:  Negative for chest pain and palpitations.  Gastrointestinal:  Negative for abdominal pain, constipation, diarrhea, nausea and vomiting.  Endocrine: Negative.   Genitourinary:  Negative for dysuria and hematuria.  Musculoskeletal:  Negative for arthralgias, back pain, joint swelling and myalgias.  Skin:  Negative for rash.  Allergic/Immunologic: Negative.   Neurological:  Positive for headaches. Negative for dizziness, weakness and light-headedness.  Psychiatric/Behavioral:  Negative for dysphoric mood. The patient is not nervous/anxious.     Current Outpatient Medications on File Prior to Visit  Medication Sig Dispense Refill   albuterol  (VENTOLIN  HFA) 108 (90 Base) MCG/ACT inhaler NEED ALBUTEROL  DIRECTIONS 18 g 1   aspirin EC 81 MG tablet Take 81 mg by mouth daily. Swallow whole.     EPINEPHrine  0.3 mg/0.3 mL IJ SOAJ injection Inject 0.3 mg into the muscle as needed for anaphylaxis. 1 each 1   FLOVENT  HFA 110 MCG/ACT inhaler Inhale 2 puffs into the lungs 2 (two) times daily. 1  each 1   fluticasone  furoate-vilanterol (BREO ELLIPTA ) 200-25 MCG/ACT AEPB Inhale 1 puff into the lungs daily. 30 each 1   montelukast  (SINGULAIR ) 10 MG tablet Take 1 tablet (10 mg total) by mouth at bedtime. 30 tablet 3   prednisoLONE acetate (PRED FORTE) 1 % ophthalmic suspension Place 1 drop into the right eye every 4 (four) hours.     triamcinolone  cream (KENALOG ) 0.1 % Apply 1 Application topically 2 (two) times daily. 30 g 0   trimethoprim -polymyxin b (POLYTRIM) ophthalmic solution Place 1 drop into the right eye 4 (four) times daily.     valACYclovir  (VALTREX ) 500 MG tablet Take 1 tablet (500 mg total) by mouth  daily. 30 tablet 2   No current facility-administered medications on file prior to visit.   Past Medical History:  Diagnosis Date   Abscess 04/20/2022   Abscess of right groin 04/20/2022   Angina at rest [facility])    Asthma, chronic 05/25/2022   Attention deficit hyperactivity disorder (ADHD), combined type 08/23/2023   Cellulitis 05/25/2022   Cellulitis of right foot 03/15/2022   Chronic fatigue 08/23/2023   Chronic osteomyelitis involving ankle and foot, right (HCC) 06/07/2022   Cough with hemoptysis 05/25/2022   Crush injury to foot, right, initial encounter 03/15/2022   Elevated serum glucose 08/23/2023   Hepatitis C antibody positive in blood 12/15/2021   HSV-1 (herpes simplex virus 1) infection    lip ulcer per Pt   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   Infection of right wrist (HCC) 07/12/2021   Left atrial enlargement    Lymphangitis 05/25/2022   Medication monitoring encounter 12/30/2021   MRSA (methicillin resistant staph aureus) culture positive    Neuropathic pain 05/25/2022   Nicotine dependence    quit July 6,2023   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) 10/01/2022   Osteomyelitis (HCC) 06/09/2022   PICC (peripherally inserted central catheter) in place 12/30/2021   Polysubstance abuse (HCC)    Primary hypertension 08/23/2023   S/P transmetatarsal amputation of foot, right (HCC) 10/01/2022   Seasonal allergies 08/23/2023   Sepsis (HCC)    Sleep apnea    Does not use CPAP (But is supposed to)   Wrist joint infection (HCC) 07/14/2021   Past Surgical History:  Procedure Laterality Date   INCISION AND DRAINAGE ABSCESS Right 05/27/2022   Procedure: INCISION AND DRAINAGE ABSCESS;  Surgeon: Malvin Marsa FALCON, DPM;  Location: [facility];  Service: Podiatry;  Laterality: Right;   IRRIGATION AND DEBRIDEMENT ABSCESS Right 04/20/2022   Procedure: IRRIGATION AND DEBRIDEMENT groinABSCESS;   Surgeon: Rubin Calamity, MD;  Location: [facility];  Service: General;  Laterality: Right;   TOE AMPUTATION     Big Toe   TRANSMETATARSAL AMPUTATION Right 03/16/2022   Procedure: TRANSMETATARSAL AMPUTATION/I&D RIGHT FOOT/BONE BIOPSY;  Surgeon: Gershon Donnice SAUNDERS, DPM;  Location: [facility];  Service: Podiatry;  Laterality: Right;  patient would like a block    Family History  Problem Relation Age of Onset   Heart attack Father    Hypertension Father    Diabetes Father    Melanoma Father    Sleep apnea Father    Hypertension Maternal Grandmother    Atrial fibrillation Maternal Grandmother    Hypertension Maternal Grandfather    Pancreatic cancer Paternal Grandmother    Social History   Socioeconomic History   Marital status: Married    Spouse name: Deddrick Saindon   Number of  children: 2   Years of education: Not on file   Highest education level: Not on file  Occupational History   Not on file  Tobacco Use   Smoking status: Former    Current packs/day: 0.00    Types: Cigarettes    Quit date: 11/2021    Years since quitting: 2.2   Smokeless tobacco: Former    Types: Associate Professor status: Never Used  Substance and Sexual Activity   Alcohol use: Not Currently    Comment: occasionally   Drug use: Not Currently   Sexual activity: Yes    Partners: Female  Other Topics Concern   Not on file  Social History Narrative   Not on file   Social Drivers of Health   Financial Resource Strain: High Risk (10/01/2022)   Received from [facility]   Overall Financial Resource Strain (CARDIA)    Difficulty of Paying Living Expenses: Hard  Food Insecurity: No Food Insecurity (08/23/2023)   Hunger Vital Sign    Worried About Running Out of Food in the Last Year: Never true    Ran Out of Food in the Last Year: Never true  Transportation Needs: No Transportation Needs (10/01/2022)   Received from [facility] - [facility]    Lack of Transportation (Medical): No     Lack of Transportation (Non-Medical): No  Physical Activity: Inactive (08/23/2023)   Exercise Vital Sign    Days of Exercise per Week: 0 days    Minutes of Exercise per Session: 0 min  Stress: No Stress Concern Present (08/23/2023)   [employer] of Occupational Health - Occupational Stress Questionnaire    Feeling of Stress : Not at all  Social Connections: Moderately Isolated (08/23/2023)   Social Connection and Isolation Panel    Frequency of Communication with Friends and Family: More than three times a week    Frequency of Social Gatherings with Friends and Family: More than three times a week    Attends Religious Services: Never    Database administrator or Organizations: No    Attends Engineer, structural: Never    Marital Status: Married    Objective:  BP 138/72 (BP Location: Left Arm, Patient Position: Sitting, Cuff Size: Large)   Pulse 82   Temp 98.3 F (36.8 C) (Temporal)   Resp 16   Ht 6' 1 (1.854 m)   Wt 190 lb 6.4 oz (86.4 kg)   SpO2 97%   BMI 25.12 kg/m      02/27/2024   12:38 PM 02/27/2024   11:52 AM 01/27/2024   11:01 AM  BP/Weight  Systolic BP 138 138 122  Diastolic BP 72 70 64  Wt. (Lbs)  190.4 184.2  BMI  25.12 kg/m2 24.3 kg/m2    Physical Exam Vitals reviewed.  Constitutional:      General: He is not in acute distress.    Appearance: Normal appearance. He is not ill-appearing.  Eyes:     Conjunctiva/sclera: Conjunctivae normal.  Cardiovascular:     Rate and Rhythm: Normal rate and regular rhythm.     Heart sounds: Normal heart sounds. No murmur heard. Pulmonary:     Effort: Pulmonary effort is normal.     Breath sounds: Normal breath sounds. No wheezing.  Musculoskeletal:        General: Normal range of motion.     Cervical back: Normal range of motion.  Skin:    General: Skin is warm.  Neurological:     Mental Status: He is alert. Mental status is at baseline.  Psychiatric:        Mood and Affect: Mood normal.         Behavior: Behavior normal.     Lab Results  Component Value Date   WBC 4.0 12/08/2023   HGB 13.9 12/08/2023   HCT 41.0 12/08/2023   PLT 195 12/08/2023   GLUCOSE 93 12/08/2023   CHOL 134 12/08/2023   TRIG 101 12/08/2023   HDL 43 12/08/2023   LDLCALC 72 12/08/2023   ALT 24 12/08/2023   AST 24 12/08/2023   NA 141 12/08/2023   K 4.3 12/08/2023   CL 103 12/08/2023   CREATININE 0.85 12/08/2023   BUN 13 12/08/2023   CO2 26 12/08/2023   TSH 0.854 08/23/2023   INR 0.9 05/25/2022   HGBA1C 5.4 12/08/2023      Assessment & Plan:  Primary hypertension Assessment & Plan: Patient states that he hasn't taken his BP medication in 3 months. D Blood pressure slightly above target. Metoprolol  recommended despite improved readings without it. BP Readings from Last 3 Encounters:  02/27/24 138/72  01/27/24 122/64  12/20/23 102/64    - Reduce metoprolol  25 mg once daily. - FU in 2 months for labs and BP management  Orders: -     Metoprolol  Tartrate; Take 1 tablet (25 mg total) by mouth 1 day or 1 dose for 1 dose.  Dispense: 30 tablet; Refill: 3  Attention deficit hyperactivity disorder (ADHD), combined type Assessment & Plan: Improved blood pressure noted.  ADHD managed with Ranexa and Vyvanse  effectively without sleep issues. Potential tolerance noted. - Continue Ranexa 60 mg in the morning. - Continue Vyvanse  30 mg around lunchtime. - Monitor for tolerance and effectiveness. - FU in 2 months    Orders: -     Methylphenidate  HCl ER (CD); Take 1 capsule (60 mg total) by mouth every morning.  Dispense: 30 capsule; Refill: 0 -     Dexmethylphenidate  HCl ER; Take 1 capsule (30 mg total) by mouth daily.  Dispense: 30 capsule; Refill: 0     Meds ordered this encounter  Medications   methylphenidate  (METADATE  CD) 60 MG CR capsule    Sig: Take 1 capsule (60 mg total) by mouth every morning.    Dispense:  30 capsule    Refill:  0   Dexmethylphenidate  HCl 30 MG CP24    Sig: Take 1  capsule (30 mg total) by mouth daily.    Dispense:  30 capsule    Refill:  0   metoprolol  tartrate (LOPRESSOR ) 25 MG tablet    Sig: Take 1 tablet (25 mg total) by mouth 1 day or 1 dose for 1 dose.    Dispense:  30 tablet    Refill:  3    Follow-up: Return in about 2 months (around 04/29/2024) for med check, lab visit.   I,Angela Taylor,acting as a Neurosurgeon for Harrie CHRISTELLA Cedar, FNP.,have documented all relevant documentation on the behalf of Harrie CHRISTELLA Cedar, FNP,as directed by  Harrie CHRISTELLA Cedar, FNP while in the presence of Harrie CHRISTELLA Cedar, FNP.   An After Visit Summary was printed and given to the patient.  I attest that I have reviewed this visit and agree with the plan scribed by my staff.   Harrie CHRISTELLA Cedar, FNP [facility] [phone]

## 2024-02-27 NOTE — Assessment & Plan Note (Addendum)
 Improved blood pressure noted.  ADHD managed with Ranexa and Vyvanse  effectively without sleep issues. Potential tolerance noted. - Continue Ranexa 60 mg in the morning. - Continue Vyvanse  30 mg around lunchtime. - Monitor for tolerance and effectiveness. - FU in 2 months

## 2024-02-28 ENCOUNTER — Other Ambulatory Visit: Payer: Self-pay

## 2024-02-28 DIAGNOSIS — I10 Essential (primary) hypertension: Secondary | ICD-10-CM

## 2024-02-28 MED ORDER — METOPROLOL TARTRATE 25 MG PO TABS
25.0000 mg | ORAL_TABLET | Freq: Every day | ORAL | 3 refills | Status: AC
Start: 2024-02-28 — End: 2024-03-29

## 2024-03-12 ENCOUNTER — Ambulatory Visit: Admitting: Family Medicine

## 2024-03-18 IMAGING — XA IR PICC >5YO
1 series · 1 of 1 positions shown · IV contrast (agent unspecified)
Comparison: none

INDICATION: Antibiotic therapy

EXAM:
ULTRASOUND AND FLUOROSCOPIC GUIDED PICC LINE INSERTION
MEDICATIONS:
None.
CONTRAST:  None
FLUOROSCOPY TIME:  1.1 mGy Ref.  Air Kerma
COMPLICATIONS:
None immediate.
TECHNIQUE: The procedure, risks, benefits, and alternatives were explained to
the patient and informed written consent was obtained. A timeout was
performed prior to the initiation of the procedure.

[Series 2: fl (-) angio · 1 of 1 slices shown]
[im 1/1]
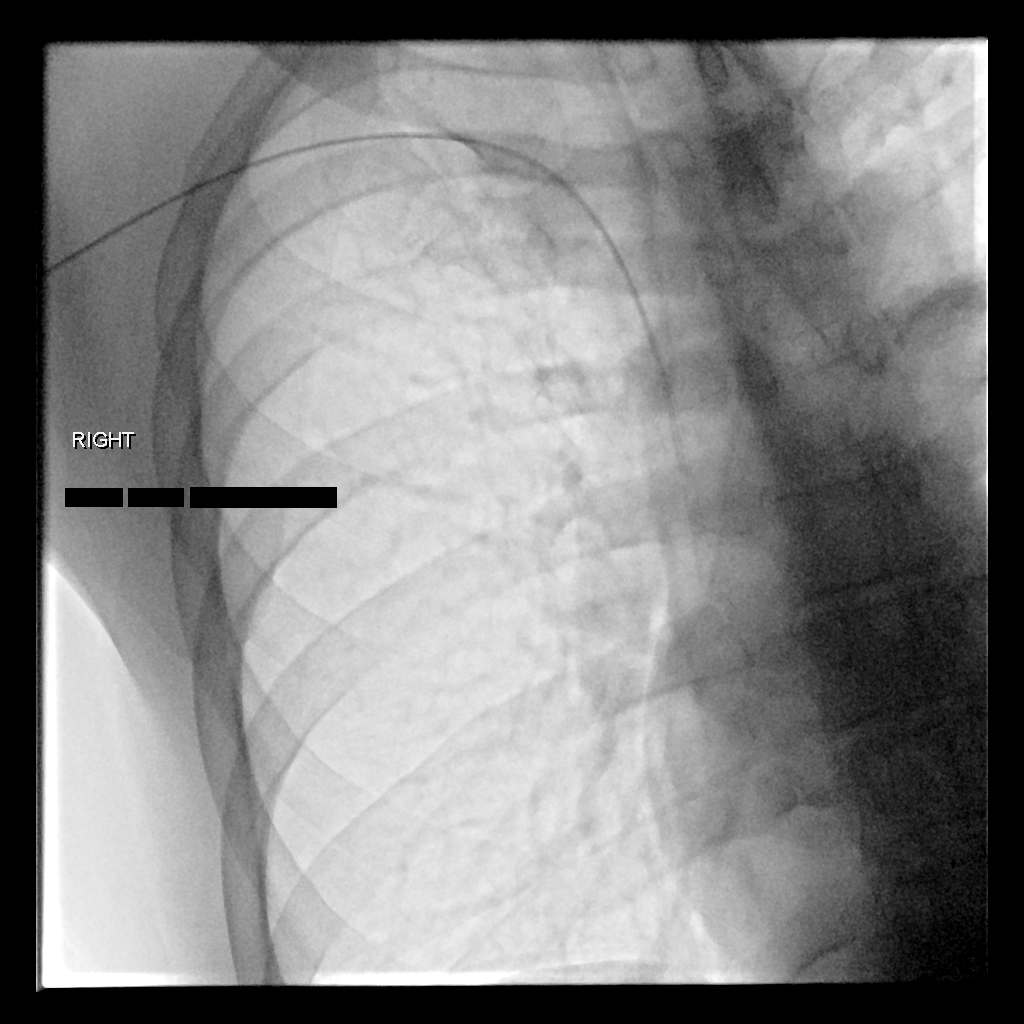

[1 of 1 positions shown; findings below may reference images not displayed]

The right upper extremity was prepped with chlorhexidine in a
sterile fashion, and a sterile drape was applied covering the
operative field. Maximum barrier sterile technique with sterile
gowns and gloves were used for the procedure. A timeout was
performed prior to the initiation of the procedure. Local anesthesia
was provided with 1% lidocaine.

Under direct ultrasound guidance, the basilic vein was accessed with
a micropuncture kit after the overlying soft tissues were
anesthetized with 1% lidocaine. Real-time ultrasound guidance was
utilized for vascular access including the acquisition of a
permanent ultrasound image documenting patency of the accessed
vessel.

A guidewire was advanced to the level of the superior caval-atrial
junction for measurement purposes and the PICC line was cut to
length. A peel-away sheath was placed and a 39 cm, 5 French, single
lumen was inserted to level of the superior caval-atrial junction. A
post procedure spot fluoroscopic was obtained. The catheter easily
aspirated and flushed and was secured in place. A dressing was
applied. The patient tolerated the procedure well without immediate
post procedural complication.
FINDINGS: After catheter placement, the tip lies within the superior
cavoatrial junction. The catheter aspirates and flushes normally and
is ready for immediate use.
IMPRESSION: Successful ultrasound and fluoroscopic guided placement of a right
brachial vein approach, 39 cm, 5 French, single lumen PICC with tip
at the superior caval-atrial junction. The PICC line is ready for
immediate use.

## 2024-03-27 ENCOUNTER — Encounter: Payer: Self-pay | Admitting: Family Medicine

## 2024-03-27 ENCOUNTER — Ambulatory Visit (INDEPENDENT_AMBULATORY_CARE_PROVIDER_SITE_OTHER): Admitting: Family Medicine

## 2024-03-27 VITALS — BP 136/76 | HR 96 | Temp 98.8°F | Resp 18 | Ht 73.0 in | Wt 187.0 lb

## 2024-03-27 DIAGNOSIS — Z800 Family history of malignant neoplasm of digestive organs: Secondary | ICD-10-CM | POA: Diagnosis not present

## 2024-03-27 DIAGNOSIS — F902 Attention-deficit hyperactivity disorder, combined type: Secondary | ICD-10-CM

## 2024-03-27 DIAGNOSIS — L237 Allergic contact dermatitis due to plants, except food: Secondary | ICD-10-CM

## 2024-03-27 MED ORDER — METHYLPHENIDATE HCL ER (OSM) 72 MG PO TBCR: 72.0000 mg | EXTENDED_RELEASE_TABLET | Freq: Every day | ORAL | 0 refills | Status: DC

## 2024-03-27 MED ORDER — PREDNISONE 20 MG PO TABS: ORAL_TABLET | ORAL | 0 refills | Status: AC

## 2024-03-27 MED ORDER — DEXMETHYLPHENIDATE HCL 10 MG PO TABS
10.0000 mg | ORAL_TABLET | Freq: Two times a day (BID) | ORAL | 0 refills | Status: DC
Start: 2024-03-27 — End: 2024-04-30

## 2024-03-27 NOTE — Assessment & Plan Note (Signed)
 Likely due to poison ivy or oak exposure. Prefers oral medication. Discussed prednisone  for inflammation and itching. Topical steroids and antihistamines recommended. Possible steroid injection if oral meds fail. - Prescribe prednisone  dose pack. - Prescribe topical steroid cream. - Recommend over-the-counter antihistamine and  - Also recommend Marie's Original Poison Ivy soap to use after work to help break down and remove the urushiol oil from your skin

## 2024-03-27 NOTE — Assessment & Plan Note (Signed)
 Cancer screening Inquired about screening due to colon cancer family history. Discussed tumor marker tests.  - Order CEA test for colon, lung, and pancreatic cancers. - Consider CA 19-9 test for pancreatic and gastrointestinal cancers. - Refer to [facility] for blood test collection.

## 2024-03-27 NOTE — Progress Notes (Signed)
 Acute Office Visit  Subjective:    Patient ID: George Shelton, male    DOB: 06/30/1981, 43 y.o.   MRN: [id number]  Chief Complaint  Patient presents with   Poison Ivy   Discussed the use of AI scribe software for clinical note transcription with the patient, who gave verbal consent to proceed.  History of Present Illness   George Shelton is a 43 year old male who presents with poison ivy, request for a cancer blood test and medication adjustment.  He is interested in undergoing a blood test to detect cancer due to his family history of colon cancer. He is uncertain of the specific name of the test but is eager to have it done. He reports that he was told he cannot undergo certain cancer screenings until he is 89 unless he pays for them or has a specific blood test that can detect cancer.  He is currently on medication for ADHD, specifically methylphenidate . He is considering adjustments to his regimen, including increasing the dose of the extended-release form to 72 mg while decreasing the dose of another medication. Currently, he is on 60 mg of the regular dose and is contemplating switching to the extended-release form. Denies any side effects of chest pain, heart palpitations, insomnia or jittery feelings.   He has a history of issues with poison ivy exposure, which causes skin discomfort. He recently came in contact again with poison ivy. He describes itchy red rash along his belt line.  He prefers prednisone  pills over injections. He has tried the OTC creams and sprays with no help with itching. 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      Past Medical History:  Diagnosis Date   Abscess 04/20/2022   Abscess of right groin 04/20/2022   Angina at rest [facility])    Asthma, chronic 05/25/2022   Attention deficit hyperactivity disorder (ADHD), combined type 08/23/2023   Cellulitis 05/25/2022   Cellulitis of right foot 03/15/2022   Chronic fatigue  08/23/2023   Chronic osteomyelitis involving ankle and foot, right (HCC) 06/07/2022   Cough with hemoptysis 05/25/2022   Crush injury to foot, right, initial encounter 03/15/2022   Elevated serum glucose 08/23/2023   Hepatitis C antibody positive in blood 12/15/2021   HSV-1 (herpes simplex virus 1) infection    lip ulcer per Pt   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   Infection of right wrist (HCC) 07/12/2021   Left atrial enlargement    Lymphangitis 05/25/2022   Medication monitoring encounter 12/30/2021   MRSA (methicillin resistant staph aureus) culture positive    Neuropathic pain 05/25/2022   Nicotine dependence    quit July 6,2023   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) 10/01/2022   Osteomyelitis (HCC) 06/09/2022   PICC (peripherally inserted central catheter) in place 12/30/2021   Polysubstance abuse (HCC)    Primary hypertension 08/23/2023   S/P transmetatarsal amputation of foot, right (HCC) 10/01/2022   Seasonal allergies 08/23/2023   Sepsis (HCC)    Sleep apnea    Does not use CPAP (But is supposed to)   Wrist joint infection (HCC) 07/14/2021    Past Surgical History:  Procedure Laterality Date   INCISION AND DRAINAGE ABSCESS Right 05/27/2022   Procedure: INCISION AND DRAINAGE ABSCESS;  Surgeon: Malvin Marsa FALCON, DPM;  Location: [facility];  Service: Podiatry;  Laterality: Right;   IRRIGATION AND DEBRIDEMENT  ABSCESS Right 04/20/2022   Procedure: IRRIGATION AND DEBRIDEMENT groinABSCESS;  Surgeon: Rubin Calamity, MD;  Location: [facility];  Service: General;  Laterality: Right;   TOE AMPUTATION     Big Toe   TRANSMETATARSAL AMPUTATION Right 03/16/2022   Procedure: TRANSMETATARSAL AMPUTATION/I&D RIGHT FOOT/BONE BIOPSY;  Surgeon: Gershon Donnice SAUNDERS, DPM;  Location: [facility];  Service: Podiatry;  Laterality: Right;  patient would like a block    Family History  Problem Relation Age of Onset    Heart attack Father    Hypertension Father    Diabetes Father    Melanoma Father    Sleep apnea Father    Hypertension Maternal Grandmother    Atrial fibrillation Maternal Grandmother    Hypertension Maternal Grandfather    Pancreatic cancer Paternal Grandmother     Social History   Socioeconomic History   Marital status: Married    Spouse name: Darelle Kings   Number of children: 2   Years of education: Not on file   Highest education level: Not on file  Occupational History   Not on file  Tobacco Use   Smoking status: Former    Current packs/day: 0.00    Types: Cigarettes    Quit date: 11/2021    Years since quitting: 2.3   Smokeless tobacco: Former    Types: Associate Professor status: Never Used  Substance and Sexual Activity   Alcohol use: Not Currently    Comment: occasionally   Drug use: Not Currently   Sexual activity: Yes    Partners: Female  Other Topics Concern   Not on file  Social History Narrative   Not on file   Social Drivers of Health   Financial Resource Strain: High Risk (10/01/2022)   Received from [facility]   Overall Financial Resource Strain (CARDIA)    Difficulty of Paying Living Expenses: Hard  Food Insecurity: No Food Insecurity (08/23/2023)   Hunger Vital Sign    Worried About Running Out of Food in the Last Year: Never true    Ran Out of Food in the Last Year: Never true  Transportation Needs: No Transportation Needs (10/01/2022)   Received from [facility] - Transportation    Lack of Transportation (Medical): No    Lack of Transportation (Non-Medical): No  Physical Activity: Inactive (08/23/2023)   Exercise Vital Sign    Days of Exercise per Week: 0 days    Minutes of Exercise per Session: 0 min  Stress: No Stress Concern Present (08/23/2023)   [employer] of Occupational Health - Occupational Stress Questionnaire    Feeling of Stress : Not at all  Social Connections: Moderately Isolated  (08/23/2023)   Social Connection and Isolation Panel    Frequency of Communication with Friends and Family: More than three times a week    Frequency of Social Gatherings with Friends and Family: More than three times a week    Attends Religious Services: Never    Database administrator or Organizations: No    Attends Banker Meetings: Never    Marital Status: Married  Catering manager Violence: Not At Risk (08/23/2023)   Humiliation, Afraid, Rape, and Kick questionnaire    Fear of Current or Ex-Partner: No    Emotionally Abused: No    Physically Abused: No    Sexually Abused: No    Outpatient Medications Prior to Visit  Medication Sig Dispense Refill   albuterol  (VENTOLIN  HFA) 108 (90 Base) MCG/ACT  inhaler NEED ALBUTEROL  DIRECTIONS 18 g 1   aspirin EC 81 MG tablet Take 81 mg by mouth daily. Swallow whole.     EPINEPHrine  0.3 mg/0.3 mL IJ SOAJ injection Inject 0.3 mg into the muscle as needed for anaphylaxis. 1 each 1   FLOVENT  HFA 110 MCG/ACT inhaler Inhale 2 puffs into the lungs 2 (two) times daily. 1 each 1   fluticasone  furoate-vilanterol (BREO ELLIPTA ) 200-25 MCG/ACT AEPB Inhale 1 puff into the lungs daily. 30 each 1   metoprolol  tartrate (LOPRESSOR ) 25 MG tablet Take 1 tablet (25 mg total) by mouth daily in the afternoon. 30 tablet 3   montelukast  (SINGULAIR ) 10 MG tablet Take 1 tablet (10 mg total) by mouth at bedtime. 30 tablet 3   triamcinolone  cream (KENALOG ) 0.1 % Apply 1 Application topically 2 (two) times daily. 30 g 0   valACYclovir  (VALTREX ) 500 MG tablet Take 1 tablet (500 mg total) by mouth daily. 30 tablet 2   Dexmethylphenidate  HCl 30 MG CP24 Take 1 capsule (30 mg total) by mouth daily. 30 capsule 0   methylphenidate  (METADATE  CD) 60 MG CR capsule Take 1 capsule (60 mg total) by mouth every morning. 30 capsule 0   prednisoLONE acetate (PRED FORTE) 1 % ophthalmic suspension Place 1 drop into the right eye every 4 (four) hours.     trimethoprim -polymyxin b  (POLYTRIM) ophthalmic solution Place 1 drop into the right eye 4 (four) times daily.     No facility-administered medications prior to visit.    Allergies  Allergen Reactions   Codeine Other (See Comments)    PATIENT DOES NOT WANT THIS   Other Other (See Comments)    History of drug addiction, per the patient- does NOT want pain medication of any kind. Sober/clean for 8 1/2 years.  Patient states NO OPIATES!!!   Shrimp Flavor Agent (Non-Screening) Swelling   Vancomycin  Other (See Comments)    It kills my white blood cells- PATIENT SAID HE **WILL NOT, UNDER ANY CIRCUMSTANCES** TAKE THIS   Shrimp (Diagnostic) Swelling    Review of Systems  Constitutional:  Negative for appetite change, fatigue and fever.  HENT:  Negative for congestion, ear pain, sinus pressure and sore throat.   Eyes: Negative.   Respiratory:  Negative for cough, chest tightness, shortness of breath and wheezing.   Cardiovascular:  Negative for chest pain and palpitations.  Gastrointestinal:  Negative for abdominal pain, constipation, diarrhea, nausea and vomiting.  Endocrine: Negative.   Genitourinary:  Negative for dysuria and hematuria.  Musculoskeletal:  Negative for arthralgias, back pain, joint swelling and myalgias.  Skin:  Positive for rash.  Allergic/Immunologic: Negative.   Neurological:  Negative for dizziness, weakness and headaches.  Hematological: Negative.   Psychiatric/Behavioral:  Negative for dysphoric mood. The patient is not nervous/anxious.        Objective:        03/27/2024   10:33 AM 02/27/2024   12:38 PM 02/27/2024   11:52 AM  Vitals with BMI  Height 6' 1  6' 1  Weight 187 lbs  190 lbs 6 oz  BMI 24.68  25.13  Systolic 136 138 861  Diastolic 76 72 70  Pulse 96  82    No data found.   Physical Exam Vitals reviewed.  Constitutional:      General: He is not in acute distress.    Appearance: Normal appearance. He is normal weight. He is not ill-appearing.  Eyes:      Conjunctiva/sclera: Conjunctivae normal.  Cardiovascular:  Rate and Rhythm: Normal rate and regular rhythm.     Heart sounds: Normal heart sounds. No murmur heard. Pulmonary:     Effort: Pulmonary effort is normal.     Breath sounds: Normal breath sounds. No wheezing.  Abdominal:     Palpations: Abdomen is soft.  Musculoskeletal:        General: Normal range of motion.  Skin:    Findings: Rash present.  Neurological:     Mental Status: He is alert. Mental status is at baseline.  Psychiatric:        Mood and Affect: Mood normal.        Behavior: Behavior normal.     Health Maintenance Due  Topic Date Due   Hepatitis C Screening  Never done   DTaP/Tdap/Td (1 - Tdap) Never done   Pneumococcal Vaccine (1 of 2 - PCV) Never done   Hepatitis B Vaccines 19-59 Average Risk (1 of 3 - 19+ 3-dose series) Never done   HPV VACCINES (1 - 3-dose SCDM series) Never done   COVID-19 Vaccine (1 - 2024-25 season) Never done       Topic Date Due   Hepatitis B Vaccines 19-59 Average Risk (1 of 3 - 19+ 3-dose series) Never done   HPV VACCINES (1 - 3-dose SCDM series) Never done     Lab Results  Component Value Date   TSH 0.854 08/23/2023   Lab Results  Component Value Date   WBC 4.0 12/08/2023   HGB 13.9 12/08/2023   HCT 41.0 12/08/2023   MCV 91 12/08/2023   PLT 195 12/08/2023   Lab Results  Component Value Date   NA 141 12/08/2023   K 4.3 12/08/2023   CO2 26 12/08/2023   GLUCOSE 93 12/08/2023   BUN 13 12/08/2023   CREATININE 0.85 12/08/2023   BILITOT 0.6 12/08/2023   ALKPHOS 84 12/08/2023   AST 24 12/08/2023   ALT 24 12/08/2023   PROT 6.1 12/08/2023   ALBUMIN 4.4 12/08/2023   CALCIUM 9.1 12/08/2023   ANIONGAP 9 06/03/2022   EGFR 110 12/08/2023   Lab Results  Component Value Date   CHOL 134 12/08/2023   Lab Results  Component Value Date   HDL 43 12/08/2023   Lab Results  Component Value Date   LDLCALC 72 12/08/2023   Lab Results  Component Value Date    TRIG 101 12/08/2023   Lab Results  Component Value Date   CHOLHDL 3.1 12/08/2023   Lab Results  Component Value Date   HGBA1C 5.4 12/08/2023       Assessment & Plan:  Attention deficit hyperactivity disorder (ADHD), combined type Assessment & Plan: Improved control Attention-deficit hyperactivity disorder (ADHD) Denies side effects of insominia Managed with methylphenidate  and dexmethylphenidate . Discussed formulations and dosages. Decided on methylphenidate  extended release 72 mg and adjusted dexmethylphenidate . - Prescribe methylphenidate  extended release 72 mg daily. - Adjust dexmethylphenidate  (FOCALIN ) 10 MG tablet twice daily as needed.  Orders: -     Methylphenidate  HCl ER (OSM); Take 1 tablet (72 mg total) by mouth daily with breakfast.  Dispense: 30 tablet; Refill: 0 -     Dexmethylphenidate  HCl; Take 1 tablet (10 mg total) by mouth 2 (two) times daily.  Dispense: 60 tablet; Refill: 0  Allergic dermatitis due to poison ivy Assessment & Plan: Likely due to poison ivy or oak exposure. Prefers oral medication. Discussed prednisone  for inflammation and itching. Topical steroids and antihistamines recommended. Possible steroid injection if oral meds fail. - Prescribe prednisone   dose pack. - Prescribe topical steroid cream. - Recommend over-the-counter antihistamine and  - Also recommend Marie's Original Poison Ivy soap to use after work to help break down and remove the urushiol oil from your skin  Orders: -     predniSONE ; Take 3 tablets (60 mg total) by mouth daily with breakfast for 3 days, THEN 2 tablets (40 mg total) daily with breakfast for 3 days, THEN 1 tablet (20 mg total) daily with breakfast for 3 days.  Dispense: 18 tablet; Refill: 0  Family history of colon cancer Assessment & Plan: Cancer screening Inquired about screening due to colon cancer family history. Discussed tumor marker tests.  - Order CEA test for colon, lung, and pancreatic cancers. - Consider  CA 19-9 test for pancreatic and gastrointestinal cancers. - Refer to [facility] for blood test collection.   Orders: -     CEA     Meds ordered this encounter  Medications   predniSONE  (DELTASONE ) 20 MG tablet    Sig: Take 3 tablets (60 mg total) by mouth daily with breakfast for 3 days, THEN 2 tablets (40 mg total) daily with breakfast for 3 days, THEN 1 tablet (20 mg total) daily with breakfast for 3 days.    Dispense:  18 tablet    Refill:  0   Methylphenidate  HCl ER, OSM, 72 MG TBCR    Sig: Take 1 tablet (72 mg total) by mouth daily with breakfast.    Dispense:  30 tablet    Refill:  0   dexmethylphenidate  (FOCALIN ) 10 MG tablet    Sig: Take 1 tablet (10 mg total) by mouth 2 (two) times daily.    Dispense:  60 tablet    Refill:  0    Orders Placed This Encounter  Procedures   CEA    Follow-up: Return in about 4 weeks (around 04/24/2024) for chronic, med check, lab visit, fasting.  An After Visit Summary was printed and given to the patient.  Harrie Cedar, FNP [facility] [phone]

## 2024-03-27 NOTE — Patient Instructions (Signed)
  VISIT SUMMARY: Today, we discussed your concerns about cancer screening, your ADHD medication regimen, and your history of poison ivy exposure. We have made some adjustments to your medications and ordered specific tests to address your concerns.  YOUR PLAN: -ALLERGIC CONTACT DERMATITIS DUE TO POISON IVY EXPOSURE: This is a skin reaction caused by contact with poison ivy. We have prescribed an oral prednisone  taper to help reduce inflammation and recommended continuing the use of your poison ivy bar soap to prevent further exposure.  -ATTENTION-DEFICIT HYPERACTIVITY DISORDER (ADHD): ADHD is a condition that affects focus and behavior. We have decided to switch your medication to methylphenidate  extended release 72 mg daily and adjust your dexamethasone  to be taken twice daily as needed.  -CANCER SCREENING: Given your family history of colon cancer, we have ordered blood tests to screen for certain types of cancer. These include the CEA test for colon, lung, and pancreatic cancers, and the CA 19-9 test for pancreatic and gastrointestinal cancers. You will need to go to [facility] for the blood test collection.  INSTRUCTIONS: Please follow up with us  after you have completed the blood tests. Continue using your poison ivy bar soap and take the prescribed medications as directed. If you have any questions or concerns, do not hesitate to contact our office.                      Contains text generated by Abridge.                                 Contains text generated by Abridge.

## 2024-03-27 NOTE — Assessment & Plan Note (Addendum)
 Improved control Attention-deficit hyperactivity disorder (ADHD) Denies side effects of insominia Managed with methylphenidate  and dexmethylphenidate . Discussed formulations and dosages. Decided on methylphenidate  extended release 72 mg and adjusted dexmethylphenidate . - Prescribe methylphenidate  extended release 72 mg daily. - Adjust dexmethylphenidate  (FOCALIN ) 10 MG tablet twice daily as needed.

## 2024-03-28 MED ORDER — METHYLPHENIDATE HCL ER (CD) 60 MG PO CPCR: 60.0000 mg | ORAL_CAPSULE | ORAL | 0 refills | Status: DC

## 2024-03-28 NOTE — Addendum Note (Signed)
 Addended by: TERESSA HARRIE HERO on: 03/28/2024 04:43 PM   Modules accepted: Orders

## 2024-03-31 ENCOUNTER — Telehealth: Payer: Self-pay

## 2024-03-31 NOTE — Telephone Encounter (Signed)
 Do you know if we gave him the order?  Copied from CRM #[id number]. Topic: Clinical - Request for Lab/Test Order >> Mar 30, 2024  1:37 PM Willma R wrote: Reason for CRM: Patient went to [facility] to have blood test [facility], but [facility] states they are unable to see the patients orders. Patient states wasn't given anything to provide. Is inquiring how he can get the orders to [facility].  Patient can be reached at [phone]

## 2024-04-09 ENCOUNTER — Other Ambulatory Visit: Payer: Self-pay

## 2024-04-09 DIAGNOSIS — E559 Vitamin D deficiency, unspecified: Secondary | ICD-10-CM

## 2024-04-09 DIAGNOSIS — I10 Essential (primary) hypertension: Secondary | ICD-10-CM

## 2024-04-09 DIAGNOSIS — Z800 Family history of malignant neoplasm of digestive organs: Secondary | ICD-10-CM

## 2024-04-10 ENCOUNTER — Other Ambulatory Visit

## 2024-04-10 DIAGNOSIS — Z800 Family history of malignant neoplasm of digestive organs: Secondary | ICD-10-CM | POA: Diagnosis not present

## 2024-04-10 DIAGNOSIS — I10 Essential (primary) hypertension: Secondary | ICD-10-CM

## 2024-04-10 DIAGNOSIS — E559 Vitamin D deficiency, unspecified: Secondary | ICD-10-CM

## 2024-04-11 ENCOUNTER — Ambulatory Visit: Admitting: Family Medicine

## 2024-04-11 ENCOUNTER — Ambulatory Visit: Payer: Self-pay | Admitting: Family Medicine

## 2024-04-11 LAB — LIPID PANEL
Chol/HDL Ratio: 2.8 ratio (ref 0.0–5.0)
Cholesterol, Total: 143 mg/dL (ref 100–199)
HDL: 52 mg/dL (ref >39)
LDL Chol Calc (NIH): 76 mg/dL (ref 0–99)
Triglycerides: 78 mg/dL (ref 0–149)
VLDL Cholesterol Cal: 15 mg/dL (ref 5–40)

## 2024-04-11 LAB — VITAMIN D 25 HYDROXY (VIT D DEFICIENCY, FRACTURES): Vit D, 25-Hydroxy: 32.2 ng/mL (ref 30.0–100.0)

## 2024-04-11 LAB — CMP14+EGFR
ALT: 22 IU/L (ref 0–44)
AST: 23 IU/L (ref 0–40)
Albumin: 4.5 g/dL (ref 4.1–5.1)
Alkaline Phosphatase: 83 IU/L (ref 44–121)
BUN/Creatinine Ratio: 17 (ref 9–20)
BUN: 16 mg/dL (ref 6–24)
Bilirubin Total: 0.6 mg/dL (ref 0.0–1.2)
CO2: 25 mmol/L (ref 20–29)
Calcium: 9.4 mg/dL (ref 8.7–10.2)
Chloride: 103 mmol/L (ref 96–106)
Creatinine, Ser: 0.96 mg/dL (ref 0.76–1.27)
Globulin, Total: 2.1 g/dL (ref 1.5–4.5)
Glucose: 93 mg/dL (ref 70–99)
Potassium: 4.5 mmol/L (ref 3.5–5.2)
Sodium: 141 mmol/L (ref 134–144)
Total Protein: 6.6 g/dL (ref 6.0–8.5)
eGFR: 100 mL/min/1.73 (ref >59)

## 2024-04-11 LAB — CBC WITH DIFFERENTIAL/PLATELET
Basophils Absolute: 0 x10E3/uL (ref 0.0–0.2)
Basos: 1 %
EOS (ABSOLUTE): 0.2 x10E3/uL (ref 0.0–0.4)
Eos: 5 %
Hematocrit: 46.7 % (ref 37.5–51.0)
Hemoglobin: 14.9 g/dL (ref 13.0–17.7)
Immature Grans (Abs): 0 x10E3/uL (ref 0.0–0.1)
Immature Granulocytes: 0 %
Lymphocytes Absolute: 1.3 x10E3/uL (ref 0.7–3.1)
Lymphs: 28 %
MCH: 29.8 pg (ref 26.6–33.0)
MCHC: 31.9 g/dL (ref 31.5–35.7)
MCV: 93 fL (ref 79–97)
Monocytes Absolute: 0.5 x10E3/uL (ref 0.1–0.9)
Monocytes: 11 %
Neutrophils Absolute: 2.5 x10E3/uL (ref 1.4–7.0)
Neutrophils: 55 %
Platelets: 230 x10E3/uL (ref 150–450)
RBC: 5 x10E6/uL (ref 4.14–5.80)
RDW: 12.1 % (ref 11.6–15.4)
WBC: 4.5 x10E3/uL (ref 3.4–10.8)

## 2024-04-11 LAB — CEA: CEA: 1.5 ng/mL (ref 0.0–4.7)

## 2024-04-13 ENCOUNTER — Ambulatory Visit: Admitting: Family Medicine

## 2024-04-16 ENCOUNTER — Encounter: Payer: Self-pay | Admitting: Family Medicine

## 2024-04-30 ENCOUNTER — Other Ambulatory Visit: Payer: Self-pay | Admitting: Family Medicine

## 2024-04-30 DIAGNOSIS — F902 Attention-deficit hyperactivity disorder, combined type: Secondary | ICD-10-CM

## 2024-04-30 MED ORDER — METHYLPHENIDATE HCL ER (CD) 60 MG PO CPCR: 60.0000 mg | ORAL_CAPSULE | ORAL | 0 refills | Status: DC

## 2024-04-30 MED ORDER — DEXMETHYLPHENIDATE HCL 10 MG PO TABS: 10.0000 mg | ORAL_TABLET | Freq: Two times a day (BID) | ORAL | 0 refills | Status: DC

## 2024-05-01 ENCOUNTER — Ambulatory Visit: Admitting: Family Medicine

## 2024-05-13 ENCOUNTER — Other Ambulatory Visit: Payer: Self-pay | Admitting: Family Medicine

## 2024-06-04 ENCOUNTER — Other Ambulatory Visit: Payer: Self-pay | Admitting: Family Medicine

## 2024-06-04 DIAGNOSIS — L309 Dermatitis, unspecified: Secondary | ICD-10-CM

## 2024-06-04 DIAGNOSIS — F902 Attention-deficit hyperactivity disorder, combined type: Secondary | ICD-10-CM

## 2024-06-04 MED ORDER — METHYLPHENIDATE HCL ER (CD) 60 MG PO CPCR: 60.0000 mg | ORAL_CAPSULE | ORAL | 0 refills | Status: DC

## 2024-06-04 MED ORDER — DEXMETHYLPHENIDATE HCL 10 MG PO TABS: 10.0000 mg | ORAL_TABLET | Freq: Two times a day (BID) | ORAL | 0 refills | Status: DC

## 2024-06-04 MED ORDER — DOXYCYCLINE HYCLATE 100 MG PO TABS: 100.0000 mg | ORAL_TABLET | Freq: Two times a day (BID) | ORAL | 0 refills | Status: AC

## 2024-07-04 ENCOUNTER — Other Ambulatory Visit: Payer: Self-pay | Admitting: Family Medicine

## 2024-07-04 DIAGNOSIS — F902 Attention-deficit hyperactivity disorder, combined type: Secondary | ICD-10-CM

## 2024-07-04 MED ORDER — METHYLPHENIDATE HCL ER (CD) 60 MG PO CPCR: 60.0000 mg | ORAL_CAPSULE | ORAL | 0 refills | Status: DC

## 2024-07-04 MED ORDER — DEXMETHYLPHENIDATE HCL 10 MG PO TABS: 10.0000 mg | ORAL_TABLET | Freq: Two times a day (BID) | ORAL | 0 refills | Status: DC

## 2024-07-16 ENCOUNTER — Ambulatory Visit: Admitting: Family Medicine

## 2024-07-16 ENCOUNTER — Encounter: Payer: Self-pay | Admitting: Family Medicine

## 2024-07-16 VITALS — BP 122/78 | HR 95 | Temp 98.4°F | Resp 16 | Ht 73.0 in | Wt 200.2 lb

## 2024-07-16 DIAGNOSIS — J302 Other seasonal allergic rhinitis: Secondary | ICD-10-CM | POA: Diagnosis not present

## 2024-07-16 DIAGNOSIS — I10 Essential (primary) hypertension: Secondary | ICD-10-CM | POA: Diagnosis not present

## 2024-07-16 DIAGNOSIS — L03115 Cellulitis of right lower limb: Secondary | ICD-10-CM

## 2024-07-16 DIAGNOSIS — F902 Attention-deficit hyperactivity disorder, combined type: Secondary | ICD-10-CM | POA: Diagnosis not present

## 2024-07-16 MED ORDER — DEXMETHYLPHENIDATE HCL 10 MG PO TABS: 10.0000 mg | ORAL_TABLET | Freq: Three times a day (TID) | ORAL | 0 refills | Status: DC

## 2024-07-16 MED ORDER — MONTELUKAST SODIUM 10 MG PO TABS: 10.0000 mg | ORAL_TABLET | Freq: Every day | ORAL | 3 refills | Status: DC

## 2024-07-16 MED ORDER — DOXYCYCLINE HYCLATE 100 MG PO TABS: 100.0000 mg | ORAL_TABLET | Freq: Two times a day (BID) | ORAL | 0 refills | Status: AC

## 2024-07-16 NOTE — Assessment & Plan Note (Signed)
 Cellulitis of right lower extremity Chronic cellulitis with pain and callus formation. Previous doxycycline  effective. Concerns about osteomyelitis and past surgeries. - Prescribed doxycycline  for 10 days. - Referred to podiatrist Dr. Asberry Failing in [place]. Orders:   Ambulatory referral to Podiatry   doxycycline  (VIBRA -TABS) 100 MG tablet; Take 1 tablet (100 mg total) by mouth 2 (two) times daily for 10 days.

## 2024-07-16 NOTE — Progress Notes (Signed)
 Subjective:  Patient ID: George Shelton, male    DOB: 1980-08-15  Age: 43 y.o. MRN: [id number]  Chief Complaint  Patient presents with   Medical Management of Chronic Issues    Discussed the use of AI scribe software for clinical note transcription with the patient, who gave verbal consent to proceed.  History of Present Illness   George Shelton is a 43 year old male with hypertension and ADHD who presents for medication management and foot pain.  Hypertension and weight gain - Not taking metoprolol  regularly - Elevated blood pressure readings - Weight increased to 200 pounds from 182 pounds in May - Recent dietary habits include consuming high-calorie foods such as a large McFlurry prior to the visit - Desires improved management of weight and blood pressure  Foot pain and callus formation - Persistent foot pain - Presence of a callus that is frequently trimmed - History of foot surgeries, including amputations - Concern for infection - Evaluation by multiple podiatrists over the past two years - Requests a referral to Joya, MD  Attention deficit hyperactivity disorder (adhd) medication management - Takes Focalin  10 mg multiple times daily - Focalin  provides better symptom control than Adderall, which causes drowsiness - Experienced a previous prescription error with a three-month supply dispensed in one bottle, causing confusion - Currently self-managing medication to maintain stable mood throughout the day  Pain management and surgical history - History of multiple surgeries - Avoids opiates, has not used opiates for 15 years - Manages pain with ibuprofen   Upper respiratory symptoms - Cold symptoms and sinus congestion - Takes Singulair  for symptom relief       09/20/2023    2:32 PM 04/01/2022   10:24 AM 12/15/2021    9:22 AM  Depression screen PHQ 2/9  Decreased Interest 0 0 0  Down, Depressed, Hopeless 0 0 1  PHQ - 2 Score 0 0 1        09/20/2023     2:32 PM  Fall Risk   Falls in the past year? 0  Number falls in past yr: 0  Injury with Fall? 0   Risk for fall due to : No Fall Risks  Follow up Falls evaluation completed     Data saved with a previous flowsheet row definition    Patient Care Team: Teressa Harrie HERO, FNP as PCP - General (Family Medicine)   Review of Systems  Constitutional:  Negative for appetite change, fatigue and fever.  HENT:  Negative for congestion, ear pain, sinus pressure and sore throat.   Eyes: Negative.   Respiratory:  Negative for cough, chest tightness, shortness of breath and wheezing.   Cardiovascular:  Negative for chest pain and palpitations.  Gastrointestinal:  Negative for abdominal pain, constipation, diarrhea, nausea and vomiting.  Endocrine: Negative.   Genitourinary:  Negative for dysuria and hematuria.  Musculoskeletal:  Positive for myalgias (right foot). Negative for arthralgias, back pain and joint swelling.  Skin:  Negative for rash.  Allergic/Immunologic: Negative.   Neurological:  Negative for dizziness, weakness, light-headedness and headaches.  Psychiatric/Behavioral:  Negative for dysphoric mood. The patient is not nervous/anxious.     Medications Ordered Prior to Encounter[1] Past Medical History:  Diagnosis Date   Abscess 04/20/2022   Abscess of right groin 04/20/2022   Angina at rest    Asthma, chronic 05/25/2022   Attention deficit hyperactivity disorder (ADHD), combined type 08/23/2023   Cellulitis 05/25/2022   Cellulitis of right foot 03/15/2022   Chronic fatigue 08/23/2023  Chronic osteomyelitis involving ankle and foot, right (HCC) 06/07/2022   Cough with hemoptysis 05/25/2022   Crush injury to foot, right, initial encounter 03/15/2022   Elevated serum glucose 08/23/2023   Hepatitis C antibody positive in blood 12/15/2021   HSV-1 (herpes simplex virus 1) infection    lip ulcer per Pt   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    Infection of right wrist (HCC) 07/12/2021   Left atrial enlargement    Lymphangitis 05/25/2022   Medication monitoring encounter 12/30/2021   MRSA (methicillin resistant staph aureus) culture positive    Neuropathic pain 05/25/2022   Nicotine dependence    quit July 6,2023   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) 10/01/2022   Osteomyelitis (HCC) 06/09/2022   PICC (peripherally inserted central catheter) in place 12/30/2021   Polysubstance abuse (HCC)    Primary hypertension 08/23/2023   S/P transmetatarsal amputation of foot, right (HCC) 10/01/2022   Seasonal allergies 08/23/2023   Sepsis (HCC)    Sleep apnea    Does not use CPAP (But is supposed to)   Wrist joint infection (HCC) 07/14/2021   Past Surgical History:  Procedure Laterality Date   INCISION AND DRAINAGE ABSCESS Right 05/27/2022   Procedure: INCISION AND DRAINAGE ABSCESS;  Surgeon: Malvin Marsa FALCON, DPM;  Location: [facility];  Service: Podiatry;  Laterality: Right;   IRRIGATION AND DEBRIDEMENT ABSCESS Right 04/20/2022   Procedure: IRRIGATION AND DEBRIDEMENT groinABSCESS;  Surgeon: Rubin Calamity, MD;  Location: [facility];  Service: General;  Laterality: Right;   TOE AMPUTATION     Big Toe   TRANSMETATARSAL AMPUTATION Right 03/16/2022   Procedure: TRANSMETATARSAL AMPUTATION/I&D RIGHT FOOT/BONE BIOPSY;  Surgeon: Gershon Donnice SAUNDERS, DPM;  Location: [facility];  Service: Podiatry;  Laterality: Right;  patient would like a block    Family History  Problem Relation Age of Onset   Heart attack Father    Hypertension Father    Diabetes Father    Melanoma Father    Sleep apnea Father    Hypertension Maternal Grandmother    Atrial fibrillation Maternal Grandmother    Hypertension Maternal Grandfather    Pancreatic cancer Paternal Grandmother    Social History   Socioeconomic History   Marital status: Married    Spouse name: Deng Kemler   Number of children: 2   Years of  education: Not on file   Highest education level: Not on file  Occupational History   Not on file  Tobacco Use   Smoking status: Former    Current packs/day: 0.00    Average packs/day: 1.5 packs/day    Types: Cigarettes    Quit date: 11/2021    Years since quitting: 2.6   Smokeless tobacco: Former    Types: Engineer, Drilling   Vaping status: Never Used  Substance and Sexual Activity   Alcohol use: Not Currently    Comment: occasionally   Drug use: Not Currently   Sexual activity: Yes    Partners: Female  Other Topics Concern   Not on file  Social History Narrative   Not on file   Social Drivers of Health   Tobacco Use: Medium Risk (07/16/2024)   Patient History    Smoking Tobacco Use: Former    Smokeless Tobacco Use: Former    Passive Exposure: Not on Actuary Strain: High Risk (10/01/2022)   Received from [employer]   Overall [employer]  Strain (CARDIA)    Difficulty of Paying Living Expenses: Hard  Food Insecurity: No Food Insecurity (08/23/2023)   Hunger Vital Sign    Worried About Running Out of Food in the Last Year: Never true    Ran Out of Food in the Last Year: Never true  Transportation Needs: No Transportation Needs (10/01/2022)   Received from [facility] - Transportation    Lack of Transportation (Medical): No    Lack of Transportation (Non-Medical): No  Physical Activity: Inactive (08/23/2023)   Exercise Vital Sign    Days of Exercise per Week: 0 days    Minutes of Exercise per Session: 0 min  Stress: No Stress Concern Present (08/23/2023)   [employer] of Occupational Health - Occupational Stress Questionnaire    Feeling of Stress : Not at all  Social Connections: Moderately Isolated (08/23/2023)   Social Connection and Isolation Panel    Frequency of Communication with Friends and Family: More than three times a week    Frequency of Social Gatherings with Friends and Family: More than three times a week     Attends Religious Services: Never    Database Administrator or Organizations: No    Attends Banker Meetings: Never    Marital Status: Married  Depression (PHQ2-9): Low Risk (09/20/2023)   Depression (PHQ2-9)    PHQ-2 Score: 0  Alcohol Screen: Low Risk (08/23/2023)   Alcohol Screen    Last Alcohol Screening Score (AUDIT): 0  Housing: Low Risk (08/23/2023)   Housing Stability Vital Sign    Unable to Pay for Housing in the Last Year: No    Number of Times Moved in the Last Year: 0    Homeless in the Last Year: No  Utilities: Not At Risk (08/23/2023)   AHC Utilities    Threatened with loss of utilities: No  Health Literacy: Adequate Health Literacy (08/23/2023)   B1300 Health Literacy    Frequency of need for help with medical instructions: Never    Objective:  BP 122/78 (BP Location: Left Arm, Patient Position: Sitting, Cuff Size: Large)   Pulse 95   Temp 98.4 F (36.9 C) (Temporal)   Resp 16   Ht 6' 1 (1.854 m)   Wt 200 lb 3.2 oz (90.8 kg)   SpO2 97%   BMI 26.41 kg/m      07/16/2024    3:53 PM 07/16/2024    3:16 PM 03/27/2024   10:33 AM  BP/Weight  Systolic BP 122 150 136  Diastolic BP 78 92 76  Wt. (Lbs)  200.2 187  BMI  26.41 kg/m2 24.67 kg/m2    Physical Exam Constitutional:      General: He is not in acute distress.    Appearance: Normal appearance. He is not ill-appearing.  Eyes:     Conjunctiva/sclera: Conjunctivae normal.  Cardiovascular:     Rate and Rhythm: Normal rate and regular rhythm.     Heart sounds: Normal heart sounds. No murmur heard. Pulmonary:     Effort: Pulmonary effort is normal. No respiratory distress.     Breath sounds: Normal breath sounds.  Abdominal:     Palpations: Abdomen is soft.  Skin:    General: Skin is warm.  Neurological:     Mental Status: He is alert and oriented to person, place, and time. Mental status is at baseline.  Psychiatric:        Mood and Affect: Mood normal.  Behavior: Behavior normal.       Lab Results  Component Value Date   WBC 4.5 04/10/2024   HGB 14.9 04/10/2024   HCT 46.7 04/10/2024   PLT 230 04/10/2024   GLUCOSE 93 04/10/2024   CHOL 143 04/10/2024   TRIG 78 04/10/2024   HDL 52 04/10/2024   LDLCALC 76 04/10/2024   ALT 22 04/10/2024   AST 23 04/10/2024   NA 141 04/10/2024   K 4.5 04/10/2024   CL 103 04/10/2024   CREATININE 0.96 04/10/2024   BUN 16 04/10/2024   CO2 25 04/10/2024   TSH 0.854 08/23/2023   INR 0.9 05/25/2022   HGBA1C 5.4 12/08/2023    Results for orders placed or performed in visit on 04/10/24  CEA   Collection Time: 04/10/24  1:02 PM  Result Value Ref Range   CEA 1.5 0.0 - 4.7 ng/mL  Vitamin D , 25-hydroxy   Collection Time: 04/10/24  1:02 PM  Result Value Ref Range   Vit D, 25-Hydroxy 32.2 30.0 - 100.0 ng/mL  Lipid panel   Collection Time: 04/10/24  1:02 PM  Result Value Ref Range   Cholesterol, Total 143 100 - 199 mg/dL   Triglycerides 78 0 - 149 mg/dL   HDL 52 >60 mg/dL   VLDL Cholesterol Cal 15 5 - 40 mg/dL   LDL Chol Calc (NIH) 76 0 - 99 mg/dL   Chol/HDL Ratio 2.8 0.0 - 5.0 ratio  CMP14+EGFR   Collection Time: 04/10/24  1:02 PM  Result Value Ref Range   Glucose 93 70 - 99 mg/dL   BUN 16 6 - 24 mg/dL   Creatinine, Ser 9.03 0.76 - 1.27 mg/dL   eGFR 899 >40 fO/fpw/8.26   BUN/Creatinine Ratio 17 9 - 20   Sodium 141 134 - 144 mmol/L   Potassium 4.5 3.5 - 5.2 mmol/L   Chloride 103 96 - 106 mmol/L   CO2 25 20 - 29 mmol/L   Calcium 9.4 8.7 - 10.2 mg/dL   Total Protein 6.6 6.0 - 8.5 g/dL   Albumin 4.5 4.1 - 5.1 g/dL   Globulin, Total 2.1 1.5 - 4.5 g/dL   Bilirubin Total 0.6 0.0 - 1.2 mg/dL   Alkaline Phosphatase 83 44 - 121 IU/L   AST 23 0 - 40 IU/L   ALT 22 0 - 44 IU/L  CBC with Differential/Platelet   Collection Time: 04/10/24  1:02 PM  Result Value Ref Range   WBC 4.5 3.4 - 10.8 x10E3/uL   RBC 5.00 4.14 - 5.80 x10E6/uL   Hemoglobin 14.9 13.0 - 17.7 g/dL   Hematocrit 53.2 62.4 - 51.0 %   MCV 93 79 - 97 fL    MCH 29.8 26.6 - 33.0 pg   MCHC 31.9 31.5 - 35.7 g/dL   RDW 87.8 88.3 - 84.5 %   Platelets 230 150 - 450 x10E3/uL   Neutrophils 55 Not Estab. %   Lymphs 28 Not Estab. %   Monocytes 11 Not Estab. %   Eos 5 Not Estab. %   Basos 1 Not Estab. %   Neutrophils Absolute 2.5 1.4 - 7.0 x10E3/uL   Lymphocytes Absolute 1.3 0.7 - 3.1 x10E3/uL   Monocytes Absolute 0.5 0.1 - 0.9 x10E3/uL   EOS (ABSOLUTE) 0.2 0.0 - 0.4 x10E3/uL   Basophils Absolute 0.0 0.0 - 0.2 x10E3/uL   Immature Granulocytes 0 Not Estab. %   Immature Grans (Abs) 0.0 0.0 - 0.1 x10E3/uL  .  Assessment & Plan:   Assessment & Plan  Cellulitis of right lower extremity Cellulitis of right lower extremity Chronic cellulitis with pain and callus formation. Previous doxycycline  effective. Concerns about osteomyelitis and past surgeries. - Prescribed doxycycline  for 10 days. - Referred to podiatrist Dr. Asberry Failing in [place]. Orders:   Ambulatory referral to Podiatry   doxycycline  (VIBRA -TABS) 100 MG tablet; Take 1 tablet (100 mg total) by mouth 2 (two) times daily for 10 days.  Attention deficit hyperactivity disorder (ADHD), combined type Attention-deficit hyperactivity disorder, combined type ADHD managed with methylphenidate  and dexmethylphenidate . Rapid metabolism noted, requiring frequent dosing. Discussed insurance and cost. - Prescribed dexmethylphenidate  10 mg, up to three times a day as needed. - Continue 60 mg extended-release methylphenidate  in the morning. Orders:   dexmethylphenidate  (FOCALIN ) 10 MG tablet; Take 1 tablet (10 mg total) by mouth in the morning, at noon, and at bedtime.  Seasonal allergies Seasonal allergic rhinitis Chronic nasal symptoms managed with Singulair . - Refilled Singulair  prescription.  Orders:   montelukast  (SINGULAIR ) 10 MG tablet; Take 1 tablet (10 mg total) by mouth at bedtime.  Primary hypertension Essential hypertension Hypertension with recent elevated readings.  Non-adherence to metoprolol . Discussed lifestyle factors including weight gain and diet. BP Readings from Last 3 Encounters:  07/16/24 122/78  03/27/24 136/76  02/27/24 138/72  - Patient has discontinued BP medication - Discussed lifestyle modifications including diet and weight management.     Follow-up: Return in about 3 months (around 10/14/2024) for chronic, fasting, lab visit.  An After Visit Summary was printed and given to the patient.  Harrie Cedar, FNP [facility] [phone]      [1]  Current Outpatient Medications on File Prior to Visit  Medication Sig Dispense Refill   albuterol  (VENTOLIN  HFA) 108 (90 Base) MCG/ACT inhaler NEED ALBUTEROL  DIRECTIONS 18 g 1   aspirin EC 81 MG tablet Take 81 mg by mouth daily. Swallow whole.     EPINEPHrine  0.3 mg/0.3 mL IJ SOAJ injection Inject 0.3 mg into the muscle as needed for anaphylaxis. 1 each 1   FLOVENT  HFA 110 MCG/ACT inhaler Inhale 2 puffs into the lungs 2 (two) times daily. 1 each 1   fluticasone  furoate-vilanterol (BREO ELLIPTA ) 200-25 MCG/ACT AEPB Inhale 1 puff into the lungs daily. 30 each 1   methylphenidate  (METADATE  CD) 60 MG CR capsule Take 1 capsule (60 mg total) by mouth every morning. 30 capsule 0   triamcinolone  cream (KENALOG ) 0.1 % Apply 1 Application topically 2 (two) times daily. 30 g 0   valACYclovir  (VALTREX ) 500 MG tablet Take 1 tablet (500 mg total) by mouth daily. 30 tablet 2   No current facility-administered medications on file prior to visit.

## 2024-07-16 NOTE — Assessment & Plan Note (Signed)
 Attention-deficit hyperactivity disorder, combined type ADHD managed with methylphenidate  and dexmethylphenidate . Rapid metabolism noted, requiring frequent dosing. Discussed insurance and cost. - Prescribed dexmethylphenidate  10 mg, up to three times a day as needed. - Continue 60 mg extended-release methylphenidate  in the morning. Orders:   dexmethylphenidate  (FOCALIN ) 10 MG tablet; Take 1 tablet (10 mg total) by mouth in the morning, at noon, and at bedtime.

## 2024-07-16 NOTE — Assessment & Plan Note (Signed)
 Essential hypertension Hypertension with recent elevated readings. Non-adherence to metoprolol . Discussed lifestyle factors including weight gain and diet. BP Readings from Last 3 Encounters:  07/16/24 122/78  03/27/24 136/76  02/27/24 138/72  - Patient has discontinued BP medication - Discussed lifestyle modifications including diet and weight management.

## 2024-07-16 NOTE — Assessment & Plan Note (Signed)
 Seasonal allergic rhinitis Chronic nasal symptoms managed with Singulair . - Refilled Singulair  prescription.  Orders:   montelukast  (SINGULAIR ) 10 MG tablet; Take 1 tablet (10 mg total) by mouth at bedtime.

## 2024-08-08 ENCOUNTER — Other Ambulatory Visit: Payer: Self-pay | Admitting: Family Medicine

## 2024-08-08 DIAGNOSIS — F902 Attention-deficit hyperactivity disorder, combined type: Secondary | ICD-10-CM

## 2024-08-08 MED ORDER — DEXMETHYLPHENIDATE HCL 10 MG PO TABS: 10.0000 mg | ORAL_TABLET | Freq: Three times a day (TID) | ORAL | 0 refills | Status: DC

## 2024-08-08 MED ORDER — METHYLPHENIDATE HCL ER (CD) 60 MG PO CPCR: 60.0000 mg | ORAL_CAPSULE | ORAL | 0 refills | Status: DC

## 2024-08-20 ENCOUNTER — Other Ambulatory Visit (HOSPITAL_COMMUNITY): Payer: Self-pay

## 2024-08-20 ENCOUNTER — Telehealth: Payer: Self-pay

## 2024-08-20 NOTE — Telephone Encounter (Signed)
 Pharmacy Patient Advocate Encounter   Received notification from [facility] that prior authorization for dexmethylphenidate  is required/requested.   Insurance verification completed.   The patient is insured through [employer].   Per test claim: PA required; PA started via CoverMyMeds. KEY [id number] . Please see clinical question(s) below that I am not finding the answer to in their chart and advise.   Insurance will only pay for 2 per day

## 2024-08-23 ENCOUNTER — Other Ambulatory Visit (HOSPITAL_COMMUNITY): Payer: Self-pay

## 2024-08-23 NOTE — Telephone Encounter (Signed)
 Pharmacy Patient Advocate Encounter  Received notification from [facility] that Prior Authorization for Dexmethylphenidate   has been APPROVED from 08/20/2024 to 08/20/2025. Ran test claim, Copay is $25. This test claim was processed through [facility]- copay amounts may vary at other pharmacies due to pharmacy/plan contracts, or as the patient moves through the different stages of their insurance plan.   PA #/Case ID/Reference #: [id number]

## 2024-09-03 ENCOUNTER — Other Ambulatory Visit (HOSPITAL_COMMUNITY): Payer: Self-pay

## 2024-09-03 ENCOUNTER — Telehealth: Admitting: Family Medicine

## 2024-09-03 ENCOUNTER — Telehealth: Payer: Self-pay

## 2024-09-03 ENCOUNTER — Other Ambulatory Visit: Payer: Self-pay | Admitting: Family Medicine

## 2024-09-03 ENCOUNTER — Encounter: Payer: Self-pay | Admitting: Family Medicine

## 2024-09-03 VITALS — BP 145/106

## 2024-09-03 DIAGNOSIS — F902 Attention-deficit hyperactivity disorder, combined type: Secondary | ICD-10-CM | POA: Diagnosis not present

## 2024-09-03 DIAGNOSIS — S51802A Unspecified open wound of left forearm, initial encounter: Secondary | ICD-10-CM

## 2024-09-03 DIAGNOSIS — J45909 Unspecified asthma, uncomplicated: Secondary | ICD-10-CM | POA: Diagnosis not present

## 2024-09-03 DIAGNOSIS — M25562 Pain in left knee: Secondary | ICD-10-CM

## 2024-09-03 DIAGNOSIS — I10 Essential (primary) hypertension: Secondary | ICD-10-CM

## 2024-09-03 DIAGNOSIS — J302 Other seasonal allergic rhinitis: Secondary | ICD-10-CM

## 2024-09-03 MED ORDER — ALBUTEROL SULFATE HFA 108 (90 BASE) MCG/ACT IN AERS: 2.0000 | INHALATION_SPRAY | Freq: Four times a day (QID) | RESPIRATORY_TRACT | 1 refills | Status: AC | PRN

## 2024-09-03 MED ORDER — DOXYCYCLINE HYCLATE 100 MG PO TABS: 100.0000 mg | ORAL_TABLET | Freq: Two times a day (BID) | ORAL | 0 refills | Status: AC

## 2024-09-03 MED ORDER — METHYLPHENIDATE HCL ER (CD) 60 MG PO CPCR: 60.0000 mg | ORAL_CAPSULE | ORAL | 0 refills | Status: AC

## 2024-09-03 MED ORDER — ARNUITY ELLIPTA 100 MCG/ACT IN AEPB: 1.0000 | INHALATION_SPRAY | Freq: Every day | RESPIRATORY_TRACT | 0 refills | Status: AC

## 2024-09-03 MED ORDER — DEXMETHYLPHENIDATE HCL 10 MG PO TABS: 10.0000 mg | ORAL_TABLET | Freq: Three times a day (TID) | ORAL | 0 refills | Status: AC

## 2024-09-03 MED ORDER — FLOVENT HFA 110 MCG/ACT IN AERO: 2.0000 | INHALATION_SPRAY | Freq: Two times a day (BID) | RESPIRATORY_TRACT | 1 refills | Status: DC

## 2024-09-03 MED ORDER — MONTELUKAST SODIUM 10 MG PO TABS: 10.0000 mg | ORAL_TABLET | Freq: Every day | ORAL | 3 refills | Status: AC

## 2024-09-03 NOTE — Assessment & Plan Note (Signed)
 Attention-deficit hyperactivity disorder, combined type ADHD managed with methylphenidate  and dexmethylphenidate . Rapid metabolism noted, requiring frequent dosing. - Continue dexmethylphenidate  10 mg, up to three times a day as needed. - Continue 60 mg extended-release methylphenidate  in the morning. Side effects - elevated BP Effective dose for daily functioning - PDMP REVIEWED TODAY.  No suspicion for abuse or diversion or overuse.  Orders:   methylphenidate  (METADATE  CD) 60 MG CR capsule; Take 1 capsule (60 mg total) by mouth every morning.   dexmethylphenidate  (FOCALIN ) 10 MG tablet; Take 1 tablet (10 mg total) by mouth in the morning, at noon, and at bedtime.

## 2024-09-03 NOTE — Assessment & Plan Note (Signed)
 Chronic nasal symptoms managed with Singulair .  - Recommended antihistamines like Zyrtec. - Refilled Singulair  prescription.  Orders:   montelukast  (SINGULAIR ) 10 MG tablet; Take 1 tablet (10 mg total) by mouth at bedtime.
